# Patient Record
Sex: Male | Born: 1940 | Race: White | Hispanic: No | Marital: Married | Smoking: Never smoker
Health system: Southern US, Community
[De-identification: ages and names within clinical notes are randomized; demographics above are authoritative.]

## PROBLEM LIST (undated history)

## (undated) DIAGNOSIS — K219 Gastro-esophageal reflux disease without esophagitis: Secondary | ICD-10-CM

## (undated) DIAGNOSIS — I499 Cardiac arrhythmia, unspecified: Secondary | ICD-10-CM

## (undated) DIAGNOSIS — I2510 Atherosclerotic heart disease of native coronary artery without angina pectoris: Secondary | ICD-10-CM

## (undated) DIAGNOSIS — K5792 Diverticulitis of intestine, part unspecified, without perforation or abscess without bleeding: Secondary | ICD-10-CM

## (undated) DIAGNOSIS — M1990 Unspecified osteoarthritis, unspecified site: Secondary | ICD-10-CM

## (undated) DIAGNOSIS — E119 Type 2 diabetes mellitus without complications: Secondary | ICD-10-CM

## (undated) DIAGNOSIS — M436 Torticollis: Secondary | ICD-10-CM

## (undated) DIAGNOSIS — I340 Nonrheumatic mitral (valve) insufficiency: Secondary | ICD-10-CM

## (undated) DIAGNOSIS — I10 Essential (primary) hypertension: Secondary | ICD-10-CM

## (undated) DIAGNOSIS — Q600 Renal agenesis, unilateral: Secondary | ICD-10-CM

## (undated) HISTORY — PX: CHOLECYSTECTOMY: SHX55

## (undated) HISTORY — PX: OTHER SURGICAL HISTORY: SHX169

## (undated) HISTORY — PX: CARDIAC CATHETERIZATION: SHX172

## (undated) HISTORY — PX: BACK SURGERY: SHX140

---

## 2001-05-29 ENCOUNTER — Emergency Department (HOSPITAL_COMMUNITY): Admission: EM | Admit: 2001-05-29 | Discharge: 2001-05-29 | Payer: Self-pay | Admitting: Emergency Medicine

## 2001-05-29 ENCOUNTER — Encounter: Payer: Self-pay | Admitting: Emergency Medicine

## 2001-05-30 ENCOUNTER — Observation Stay (HOSPITAL_COMMUNITY): Admission: EM | Admit: 2001-05-30 | Discharge: 2001-06-01 | Payer: Self-pay | Admitting: Emergency Medicine

## 2001-05-31 ENCOUNTER — Encounter: Payer: Self-pay | Admitting: Cardiology

## 2001-06-18 ENCOUNTER — Emergency Department (HOSPITAL_COMMUNITY): Admission: EM | Admit: 2001-06-18 | Discharge: 2001-06-18 | Payer: Self-pay | Admitting: Emergency Medicine

## 2002-08-15 ENCOUNTER — Encounter: Payer: Self-pay | Admitting: Emergency Medicine

## 2002-08-15 ENCOUNTER — Emergency Department (HOSPITAL_COMMUNITY): Admission: EM | Admit: 2002-08-15 | Discharge: 2002-08-16 | Payer: Self-pay | Admitting: Emergency Medicine

## 2004-11-24 ENCOUNTER — Emergency Department: Payer: Self-pay | Admitting: Emergency Medicine

## 2005-01-09 ENCOUNTER — Ambulatory Visit: Payer: Self-pay | Admitting: General Surgery

## 2005-03-03 ENCOUNTER — Ambulatory Visit: Payer: Self-pay | Admitting: General Surgery

## 2007-03-22 ENCOUNTER — Emergency Department: Payer: Self-pay | Admitting: Emergency Medicine

## 2007-04-26 ENCOUNTER — Ambulatory Visit: Payer: Self-pay | Admitting: Gastroenterology

## 2007-05-11 ENCOUNTER — Ambulatory Visit: Payer: Self-pay | Admitting: Surgery

## 2007-05-11 ENCOUNTER — Other Ambulatory Visit: Payer: Self-pay

## 2007-05-12 ENCOUNTER — Ambulatory Visit: Payer: Self-pay | Admitting: Cardiology

## 2007-05-19 ENCOUNTER — Ambulatory Visit: Payer: Self-pay | Admitting: Surgery

## 2007-11-02 ENCOUNTER — Other Ambulatory Visit: Payer: Self-pay

## 2007-11-02 ENCOUNTER — Emergency Department: Payer: Self-pay | Admitting: Emergency Medicine

## 2008-03-01 ENCOUNTER — Ambulatory Visit: Payer: Self-pay | Admitting: Internal Medicine

## 2008-03-29 ENCOUNTER — Ambulatory Visit: Payer: Self-pay | Admitting: Internal Medicine

## 2008-08-13 ENCOUNTER — Ambulatory Visit: Payer: Self-pay | Admitting: Gastroenterology

## 2009-03-31 ENCOUNTER — Emergency Department: Payer: Self-pay | Admitting: Emergency Medicine

## 2009-04-03 ENCOUNTER — Ambulatory Visit: Payer: Self-pay | Admitting: Internal Medicine

## 2010-03-24 ENCOUNTER — Emergency Department: Payer: Self-pay | Admitting: Emergency Medicine

## 2010-04-28 ENCOUNTER — Emergency Department: Payer: Self-pay | Admitting: Emergency Medicine

## 2010-07-23 ENCOUNTER — Emergency Department: Payer: Self-pay | Admitting: Emergency Medicine

## 2011-03-30 ENCOUNTER — Ambulatory Visit: Payer: Self-pay | Admitting: Internal Medicine

## 2011-10-28 ENCOUNTER — Other Ambulatory Visit: Payer: Self-pay | Admitting: Neurosurgery

## 2011-10-28 DIAGNOSIS — M542 Cervicalgia: Secondary | ICD-10-CM

## 2011-11-06 ENCOUNTER — Ambulatory Visit
Admission: RE | Admit: 2011-11-06 | Discharge: 2011-11-06 | Disposition: A | Discharge status: Home / Self Care | Payer: Medicare Other | Source: Ambulatory Visit | Attending: Neurosurgery | Admitting: Neurosurgery

## 2011-11-06 DIAGNOSIS — M542 Cervicalgia: Secondary | ICD-10-CM

## 2011-11-24 ENCOUNTER — Emergency Department: Payer: Self-pay | Admitting: Emergency Medicine

## 2011-11-25 ENCOUNTER — Emergency Department: Payer: Self-pay

## 2011-11-25 LAB — COMPREHENSIVE METABOLIC PANEL
Albumin: 3.6 g/dL (ref 3.4–5.0)
Albumin: 3.8 g/dL (ref 3.4–5.0)
Alkaline Phosphatase: 80 U/L (ref 50–136)
Anion Gap: 12 (ref 7–16)
Anion Gap: 10 (ref 7–16)
BUN: 25 mg/dL — ABNORMAL HIGH (ref 7–18)
BUN: 18 mg/dL (ref 7–18)
Bilirubin,Total: 0.2 mg/dL (ref 0.2–1.0)
Bilirubin,Total: 0.2 mg/dL (ref 0.2–1.0)
Calcium, Total: 8.5 mg/dL (ref 8.5–10.1)
Calcium, Total: 8.6 mg/dL (ref 8.5–10.1)
Chloride: 110 mmol/L — ABNORMAL HIGH (ref 98–107)
Co2: 22 mmol/L (ref 21–32)
Co2: 22 mmol/L (ref 21–32)
Creatinine: 1.42 mg/dL — ABNORMAL HIGH (ref 0.60–1.30)
Creatinine: 1.14 mg/dL (ref 0.60–1.30)
EGFR (African American): 58 — ABNORMAL LOW
EGFR (African American): >60
EGFR (Non-African Amer.): 50 — ABNORMAL LOW
EGFR (Non-African Amer.): >60
Glucose: 141 mg/dL — ABNORMAL HIGH (ref 65–99)
Glucose: 155 mg/dL — ABNORMAL HIGH (ref 65–99)
Osmolality: 294 (ref 275–301)
Osmolality: 292 (ref 275–301)
Potassium: 3.2 mmol/L — ABNORMAL LOW (ref 3.5–5.1)
Potassium: 4.3 mmol/L (ref 3.5–5.1)
SGOT(AST): 24 U/L (ref 15–37)
SGOT(AST): 25 U/L (ref 15–37)
SGPT (ALT): 38 U/L
SGPT (ALT): 36 U/L
Sodium: 144 mmol/L (ref 136–145)
Sodium: 144 mmol/L (ref 136–145)
Total Protein: 6.7 g/dL (ref 6.4–8.2)
Total Protein: 7.1 g/dL (ref 6.4–8.2)

## 2011-11-25 LAB — CBC
HCT: 39.6 % — ABNORMAL LOW (ref 40.0–52.0)
HGB: 13 g/dL (ref 13.0–18.0)
HGB: 13.3 g/dL (ref 13.0–18.0)
MCH: 30.9 pg (ref 26.0–34.0)
MCH: 30.4 pg (ref 26.0–34.0)
MCHC: 33.9 g/dL (ref 32.0–36.0)
MCV: 91 fL (ref 80–100)
Platelet: 201 10*3/uL (ref 150–440)
RDW: 13.3 % (ref 11.5–14.5)
RDW: 13 % (ref 11.5–14.5)
WBC: 7.3 10*3/uL (ref 3.8–10.6)

## 2011-11-25 LAB — URINALYSIS, COMPLETE
Bacteria: NONE SEEN
Blood: NEGATIVE
Ketone: NEGATIVE
RBC,UR: <1 /HPF (ref 0–5)
Specific Gravity: 1.03 (ref 1.003–1.030)
Squamous Epithelial: <1
WBC UR: <1 /HPF (ref 0–5)

## 2011-11-25 LAB — CK TOTAL AND CKMB (NOT AT ARMC)
CK, Total: 271 U/L — ABNORMAL HIGH (ref 35–232)
CK-MB: 3.4 ng/mL (ref 0.5–3.6)

## 2011-11-25 LAB — TROPONIN I: Troponin-I: <0.02 ng/mL

## 2012-01-20 ENCOUNTER — Ambulatory Visit: Payer: Self-pay | Admitting: Gastroenterology

## 2012-01-22 LAB — PATHOLOGY REPORT

## 2012-08-04 ENCOUNTER — Emergency Department: Payer: Self-pay | Admitting: Emergency Medicine

## 2012-08-04 LAB — COMPREHENSIVE METABOLIC PANEL
Albumin: 4.1 g/dL (ref 3.4–5.0)
Anion Gap: 7 (ref 7–16)
BUN: 17 mg/dL (ref 7–18)
Bilirubin,Total: 0.4 mg/dL (ref 0.2–1.0)
Calcium, Total: 8.9 mg/dL (ref 8.5–10.1)
Co2: 25 mmol/L (ref 21–32)
EGFR (Non-African Amer.): 53 — ABNORMAL LOW
Glucose: 131 mg/dL — ABNORMAL HIGH (ref 65–99)
Potassium: 3.8 mmol/L (ref 3.5–5.1)
SGPT (ALT): 56 U/L (ref 12–78)
Total Protein: 7.7 g/dL (ref 6.4–8.2)

## 2012-08-04 LAB — CBC
HCT: 41.9 % (ref 40.0–52.0)
MCH: 29.1 pg (ref 26.0–34.0)
Platelet: 235 10*3/uL (ref 150–440)
WBC: 8 10*3/uL (ref 3.8–10.6)

## 2012-08-04 LAB — URINALYSIS, COMPLETE
Bacteria: NONE SEEN
Blood: NEGATIVE
Ketone: NEGATIVE
RBC,UR: <1 /HPF (ref 0–5)
Specific Gravity: 1.006 (ref 1.003–1.030)
Squamous Epithelial: NONE SEEN

## 2012-08-04 LAB — PROTIME-INR: Prothrombin Time: 13.2 secs (ref 11.5–14.7)

## 2012-08-04 LAB — TROPONIN I: Troponin-I: <0.02 ng/mL

## 2013-02-13 ENCOUNTER — Emergency Department: Payer: Self-pay | Admitting: Emergency Medicine

## 2013-02-13 LAB — COMPREHENSIVE METABOLIC PANEL
Albumin: 3.7 g/dL (ref 3.4–5.0)
Alkaline Phosphatase: 75 U/L (ref 50–136)
Anion Gap: 7 (ref 7–16)
Bilirubin,Total: 0.3 mg/dL (ref 0.2–1.0)
Co2: 23 mmol/L (ref 21–32)
Creatinine: 1.45 mg/dL — ABNORMAL HIGH (ref 0.60–1.30)
EGFR (African American): 56 — ABNORMAL LOW
Glucose: 127 mg/dL — ABNORMAL HIGH (ref 65–99)
Osmolality: 284 (ref 275–301)
Potassium: 4 mmol/L (ref 3.5–5.1)
SGOT(AST): 30 U/L (ref 15–37)
SGPT (ALT): 51 U/L (ref 12–78)

## 2013-02-13 LAB — CBC
HCT: 40.4 % (ref 40.0–52.0)
HGB: 14.2 g/dL (ref 13.0–18.0)
MCV: 89 fL (ref 80–100)
RBC: 4.56 10*6/uL (ref 4.40–5.90)
RDW: 13.3 % (ref 11.5–14.5)
WBC: 10.2 10*3/uL (ref 3.8–10.6)

## 2013-02-13 LAB — URINALYSIS, COMPLETE
Bilirubin,UR: NEGATIVE
Blood: NEGATIVE
Leukocyte Esterase: NEGATIVE
Nitrite: NEGATIVE
Ph: 5 (ref 4.5–8.0)
RBC,UR: <1 /HPF (ref 0–5)
Squamous Epithelial: NONE SEEN
WBC UR: 1 /HPF (ref 0–5)

## 2013-02-13 LAB — TROPONIN I: Troponin-I: <0.02 ng/mL

## 2013-03-27 ENCOUNTER — Other Ambulatory Visit: Payer: Self-pay | Admitting: Neurosurgery

## 2013-03-27 DIAGNOSIS — M419 Scoliosis, unspecified: Secondary | ICD-10-CM

## 2013-03-28 ENCOUNTER — Ambulatory Visit
Admission: RE | Admit: 2013-03-28 | Discharge: 2013-03-28 | Disposition: A | Discharge status: Home / Self Care | Payer: Medicare Other | Source: Ambulatory Visit | Attending: Neurosurgery | Admitting: Neurosurgery

## 2013-03-28 DIAGNOSIS — M419 Scoliosis, unspecified: Secondary | ICD-10-CM

## 2013-06-29 HISTORY — PX: CARDIAC CATHETERIZATION: SHX172

## 2013-08-12 LAB — BASIC METABOLIC PANEL
Anion Gap: 6 — ABNORMAL LOW (ref 7–16)
BUN: 16 mg/dL (ref 7–18)
CREATININE: 1.16 mg/dL (ref 0.60–1.30)
Calcium, Total: 9.6 mg/dL (ref 8.5–10.1)
Chloride: 110 mmol/L — ABNORMAL HIGH (ref 98–107)
Co2: 26 mmol/L (ref 21–32)
EGFR (Non-African Amer.): >60
GLUCOSE: 112 mg/dL — AB (ref 65–99)
Osmolality: 285 (ref 275–301)
Potassium: 3.8 mmol/L (ref 3.5–5.1)
Sodium: 142 mmol/L (ref 136–145)

## 2013-08-12 LAB — CBC
HCT: 42.4 % (ref 40.0–52.0)
HGB: 14.4 g/dL (ref 13.0–18.0)
MCH: 31.1 pg (ref 26.0–34.0)
MCHC: 33.9 g/dL (ref 32.0–36.0)
MCV: 92 fL (ref 80–100)
Platelet: 216 10*3/uL (ref 150–440)
RBC: 4.63 10*6/uL (ref 4.40–5.90)
RDW: 13.8 % (ref 11.5–14.5)
WBC: 8.5 10*3/uL (ref 3.8–10.6)

## 2013-08-12 LAB — TROPONIN I

## 2013-08-13 ENCOUNTER — Observation Stay: Payer: Self-pay | Admitting: Internal Medicine

## 2013-08-13 LAB — CK: CK, Total: 104 U/L

## 2013-08-13 LAB — TROPONIN I
Troponin-I: <0.02 ng/mL
Troponin-I: <0.02 ng/mL

## 2013-08-13 LAB — CK TOTAL AND CKMB (NOT AT ARMC)
CK, TOTAL: 88 U/L
CK-MB: 1.5 ng/mL (ref 0.5–3.6)

## 2013-08-13 LAB — CK-MB
CK-MB: 1.5 ng/mL (ref 0.5–3.6)
CK-MB: 1.6 ng/mL (ref 0.5–3.6)

## 2013-08-14 LAB — CBC WITH DIFFERENTIAL/PLATELET
BASOS ABS: 0.1 10*3/uL (ref 0.0–0.1)
Basophil %: 1 %
Eosinophil #: 0.4 10*3/uL (ref 0.0–0.7)
Eosinophil %: 4.8 %
HCT: 41.8 % (ref 40.0–52.0)
HGB: 14.2 g/dL (ref 13.0–18.0)
LYMPHS PCT: 26.3 %
Lymphocyte #: 2.4 10*3/uL (ref 1.0–3.6)
MCH: 30.9 pg (ref 26.0–34.0)
MCHC: 33.9 g/dL (ref 32.0–36.0)
MCV: 91 fL (ref 80–100)
MONOS PCT: 9.2 %
Monocyte #: 0.8 x10 3/mm (ref 0.2–1.0)
Neutrophil #: 5.3 10*3/uL (ref 1.4–6.5)
Neutrophil %: 58.7 %
Platelet: 213 10*3/uL (ref 150–440)
RBC: 4.59 10*6/uL (ref 4.40–5.90)
RDW: 13.9 % (ref 11.5–14.5)
WBC: 9.1 10*3/uL (ref 3.8–10.6)

## 2013-08-14 LAB — BASIC METABOLIC PANEL
ANION GAP: 5 — AB (ref 7–16)
BUN: 13 mg/dL (ref 7–18)
CALCIUM: 9.2 mg/dL (ref 8.5–10.1)
CHLORIDE: 108 mmol/L — AB (ref 98–107)
CREATININE: 1.16 mg/dL (ref 0.60–1.30)
Co2: 26 mmol/L (ref 21–32)
EGFR (African American): >60
EGFR (Non-African Amer.): >60
Glucose: 112 mg/dL — ABNORMAL HIGH (ref 65–99)
Osmolality: 278 (ref 275–301)
Potassium: 3.8 mmol/L (ref 3.5–5.1)
SODIUM: 139 mmol/L (ref 136–145)

## 2013-08-14 LAB — MAGNESIUM: Magnesium: 1.8 mg/dL

## 2013-10-27 ENCOUNTER — Emergency Department: Payer: Self-pay | Admitting: Emergency Medicine

## 2013-10-27 LAB — CBC WITH DIFFERENTIAL/PLATELET
Basophil #: 0.1 10*3/uL (ref 0.0–0.1)
Basophil %: 0.9 %
EOS ABS: 0.4 10*3/uL (ref 0.0–0.7)
Eosinophil %: 4.2 %
HCT: 44.3 % (ref 40.0–52.0)
HGB: 14.6 g/dL (ref 13.0–18.0)
LYMPHS ABS: 2.3 10*3/uL (ref 1.0–3.6)
LYMPHS PCT: 27.5 %
MCH: 29.7 pg (ref 26.0–34.0)
MCHC: 32.8 g/dL (ref 32.0–36.0)
MCV: 91 fL (ref 80–100)
MONO ABS: 0.8 x10 3/mm (ref 0.2–1.0)
Monocyte %: 9.4 %
Neutrophil #: 4.9 10*3/uL (ref 1.4–6.5)
Neutrophil %: 58 %
PLATELETS: 255 10*3/uL (ref 150–440)
RBC: 4.89 10*6/uL (ref 4.40–5.90)
RDW: 13.3 % (ref 11.5–14.5)
WBC: 8.5 10*3/uL (ref 3.8–10.6)

## 2013-10-27 LAB — URINALYSIS, COMPLETE
BACTERIA: NONE SEEN
BILIRUBIN, UR: NEGATIVE
BLOOD: NEGATIVE
Glucose,UR: NEGATIVE mg/dL (ref 0–75)
Ketone: NEGATIVE
Leukocyte Esterase: NEGATIVE
NITRITE: NEGATIVE
Ph: 6 (ref 4.5–8.0)
Protein: NEGATIVE
SQUAMOUS EPITHELIAL: NONE SEEN
Specific Gravity: 1.008 (ref 1.003–1.030)
WBC UR: <1 /HPF (ref 0–5)

## 2013-10-27 LAB — BASIC METABOLIC PANEL
ANION GAP: 6 — AB (ref 7–16)
BUN: 14 mg/dL (ref 7–18)
CO2: 26 mmol/L (ref 21–32)
Calcium, Total: 9.3 mg/dL (ref 8.5–10.1)
Chloride: 109 mmol/L — ABNORMAL HIGH (ref 98–107)
Creatinine: 1.06 mg/dL (ref 0.60–1.30)
EGFR (African American): >60
EGFR (Non-African Amer.): >60
Glucose: 137 mg/dL — ABNORMAL HIGH (ref 65–99)
OSMOLALITY: 284 (ref 275–301)
Potassium: 3.7 mmol/L (ref 3.5–5.1)
Sodium: 141 mmol/L (ref 136–145)

## 2013-10-31 ENCOUNTER — Emergency Department: Payer: Self-pay | Admitting: Emergency Medicine

## 2013-10-31 LAB — BASIC METABOLIC PANEL
Anion Gap: 6 — ABNORMAL LOW (ref 7–16)
BUN: 15 mg/dL (ref 7–18)
CHLORIDE: 108 mmol/L — AB (ref 98–107)
CO2: 28 mmol/L (ref 21–32)
CREATININE: 0.99 mg/dL (ref 0.60–1.30)
Calcium, Total: 9.7 mg/dL (ref 8.5–10.1)
EGFR (African American): >60
EGFR (Non-African Amer.): >60
Glucose: 94 mg/dL (ref 65–99)
Osmolality: 284 (ref 275–301)
Potassium: 4.1 mmol/L (ref 3.5–5.1)
SODIUM: 142 mmol/L (ref 136–145)

## 2013-10-31 LAB — CBC
HCT: 43.5 % (ref 40.0–52.0)
HGB: 14.7 g/dL (ref 13.0–18.0)
MCH: 30.5 pg (ref 26.0–34.0)
MCHC: 33.8 g/dL (ref 32.0–36.0)
MCV: 90 fL (ref 80–100)
PLATELETS: 256 10*3/uL (ref 150–440)
RBC: 4.81 10*6/uL (ref 4.40–5.90)
RDW: 13.2 % (ref 11.5–14.5)
WBC: 7.9 10*3/uL (ref 3.8–10.6)

## 2013-10-31 LAB — TROPONIN I: Troponin-I: <0.02 ng/mL

## 2013-12-05 ENCOUNTER — Encounter: Payer: Self-pay | Admitting: Internal Medicine

## 2013-12-27 ENCOUNTER — Encounter: Payer: Self-pay | Admitting: Internal Medicine

## 2014-01-27 ENCOUNTER — Encounter: Payer: Self-pay | Admitting: Internal Medicine

## 2014-10-20 NOTE — H&P (Signed)
PATIENT NAME:  Edwin Olson, Edwin Olson MR#:  [id number] DATE OF BIRTH:  1940/12/17  DATE OF ADMISSION:  08/12/2013  REFERRING PHYSICIAN: Dr. Jacqualine Code.    PRIMARY CARE PHYSICIAN: Dr. Fulton Reek.   PRIMARY CARDIOLOGIST: Dr. Nehemiah Massed.   CHIEF COMPLAINT: This is a 74 year old male with significant past medical history of diabetes, hyperlipidemia, hypertension, who presents with complaints of chest pain, reports it has been intermittent over the last two weeks, left side pressure/tightness quality, radiating to the neck and the left arm. No provoking factor, but relieved by when he received nitro paste in the ED. The patient reports the last time he had a stress test was before two years as a part of physical and he does not recall any abnormalities in it. The patient, he took, four baby aspirin at home prior to presenting to ED. The patient's first troponin is negative. His EKG did not show any significant ST or T wave abnormalities. Denies any vomiting, diaphoresis, palpitations, shortness of breath or dizziness. Hospitalist service requested to admit the patient for further work-up.   PAST MEDICAL HISTORY: 1.  Diabetes mellitus.  2.  Hyperlipidemia.  3.  Hypertension.  4.  Benign neoplasm of large bowel. 5.  Idiopathic peripheral neuropathy. 6.  Chronic back pain.  7.  Diverticulosis.   PAST SURGICAL HISTORY: 1.  Lumbar surgery.  2.  Cholecystectomy.   SOCIAL HISTORY: The patient is married. No alcohol. No illicit drug use. No smoking.  FAMILY HISTORY:  Significant for coronary artery disease in both of his parents.   ALLERGIES:  1.  ANTIVERT.  2.  ATENOLOL.  3.  DOXYCYCLINE.   [place].   5.  GLUCOTROL.  6.  PENICILLIN.   HOME MEDICATIONS: 1.  Aspirin 81 mg daily.  2.  Toprol-XL 25 mg oral daily.  3.  Vicodin as needed.  4.  Lisinopril 20 mg daily.  5.  Losartan 50 mg oral daily. 6.  Terazosin 5 mg oral at bedtime.  7.  Gabapentin 600 mg oral 2 times a day.  8.  Lantus 55  units daily.  9.  Metformin 500 mg oral 2 times a day.  10.  Lovastatin 40 mg oral daily. 11.  Zolpidem 5 mg at bedtime.  12.  Methocarbamol 500 mg oral at bedtime.   REVIEW OF SYSTEMS:  CONSTITUTIONAL: Denies fever, chills, fatigue, weakness, weight gain, weight loss.  EYES: Denies blurry vision, double vision, inflammation, glaucoma.  ENT: Denies tinnitus, ear pain, hearing loss, epistaxis or discharge.  RESPIRATORY: Denies cough, wheezing, hemoptysis, dyspnea.  CARDIOVASCULAR: Reports chest pain; currently resolved. Denies edema, arrhythmia, palpitations, syncope.  GASTROINTESTINAL: Reports nausea. Denies vomiting, diarrhea, abdominal pain, hematemesis, constipation.  GENITOURINARY: Denies dysuria, hematuria, renal colic.  ENDOCRINE: Denies polyuria, polydipsia, heat or cold intolerance.  HEMATOLOGY: Denies anemia, easy bruising, bleeding diathesis.  INTEGUMENTARY: Denies acne, rash or skin lesion.  MUSCULOSKELETAL: Denies any gout, arthritis or cramps.  NEUROLOGIC: No denies CVA, transient ischemic attack, headache, ataxia, tremor.  PSYCHIATRIC: Denies anxiety, insomnia or bipolar disorder.   PHYSICAL EXAMINATION: VITAL SIGNS: Pulse 73, respiratory rate 18, blood pressure 181/98, saturating 95% on room.  GENERAL: Well-nourished male who looks comfortable in bed, in no apparent distress.  HEENT: Head atraumatic, normocephalic. Pupils equal, reactive to light. Pink conjunctivae. Anicteric sclerae. Moist oral mucosa.  NECK: Supple. No thyromegaly. No JVD.  CHEST: Good air entry bilaterally. No wheezing, rales, rhonchi.  CARDIOVASCULAR: S1, S2 heard. No rubs, murmur or gallops. No carotid bruits.   ABDOMEN:  Soft, nontender, nondistended. Bowel sounds present.  EXTREMITIES: No edema. No clubbing. No cyanosis. Pedal pulses +2 bilaterally.  PSYCHIATRIC: Appropriate affect. Awake, alert x 3. Intact judgment and insight.  MUSCULOSKELETAL: No joint effusion or erythema.   DATABASE: Glucose  112, BUN 16, creatinine 1.16, sodium 142, potassium 3.8, chloride 110, CO2 26. Troponin less than 0.02. White blood cell 8.5, hemoglobin 14.4, hematocrit 42.4, platelets 216.   EKG showing normal sinus rhythm at 73 beats per minute with left anterior fascicular block without significant ST or T wave abnormalities.   ASSESSMENT AND PLAN: 1.  Chest pain. The patient having risk factors, as well, given the fact his chest pain resolved with nitroglycerin, he will be admitted to telemetry unit. Will continue to cycle his cardiac enzymes and follow the trend. He already took 324 mg of aspirin at home as instructed by EMS over the phone. Currently, having nitro paste; currently is chest pain-free.  We will consult cardiology, Dr. Nehemiah Massed, who is familiar with the patient, especially as we could not have stress test done on Sunday.  2.  Hypertension, uncontrolled. The patient received nitro paste, I expect it will improve, as well we will continue him on his on his home medications.  3.  Diabetes mellitus. Resume the patient's metformin, with insulin sliding scale, we will resume his Lantus at a lower dose. We will decrease from 55 to 40 units subcutaneous daily.  4.  Idiopathic neuropathy. Continue with gabapentin.  5. 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 Continue with p.r.n. Vicodin and methocarbamol.  6.  Hyperlipidemia. Continue with statin.  7.  Deep vein thrombosis prophylaxis. Subcutaneous heparin.  8.  CODE STATUS: The patient reports he has a Living Will;  his wife is his healthcare power of attorney.  He is a full code.   Time spent on admission and patient care:  45 minutes.    ____________________________ Albertine Patricia, MD dse:NTS D: 08/13/2013 01:43:48 ET T: 08/13/2013 01:59:43 ET JOB#: [id number]  cc: Albertine Patricia, MD, <Dictator> Heitor Steinhoff Graciela Husbands MD ELECTRONICALLY SIGNED 08/15/2013 3:08

## 2014-10-20 NOTE — Consult Note (Signed)
PATIENT NAME:  Edwin Olson, Edwin Olson MR#:  [id number] DATE OF BIRTH:  31-Aug-1940  DATE OF CONSULTATION:  08/13/2013  REFERRING PHYSICIAN:  Dr. Margaretmary Eddy. CONSULTING PHYSICIAN:  Corey Skains, MD  REASON FOR CONSULTATION: Diabetes, hypertension, hyperlipidemia and unstable angina.   CHIEF COMPLAINT: "I have chest pain."   HISTORY OF PRESENT ILLNESS: This is a 74 year old male with known cardiovascular risk factors including diabetes, hypertension and hyperlipidemia on appropriate medication management. Recently, he has had shortness of breath  > activities, relieved by rest. In addition to that, he has had some episodes of chest discomfort, substernal in nature, radiating into his left arm which occurred at rest and awakened him this morning. At that time an EKG showed no evidence of acute myocardial infarction and current troponin is normal. The patient is relieved by nitroglycerin and other medication management as well as oxygen and feels comfortable at this time.   REVIEW OF SYSTEMS: The remainder review of systems negative for vision change, ringing in the ears, hearing loss, cough, congestion, heartburn, nausea, vomiting, diarrhea, bloody stools, stomach pain, extremity pain, leg weakness, cramping of the buttocks, known blood clots, headaches, blackouts, dizzy spells, nosebleeds, congestion, trouble swallowing, frequent urination, urination at night, muscle weakness, numbness, anxiety, depression, skin lesions or skin rashes.   PAST MEDICAL HISTORY: 1. Hypertension.  2. Hyperlipidemia.  3. Diabetes.   FAMILY HISTORY: No family members with early onset of cardiovascular disease or hypertension.   SOCIAL HISTORY: Currently denies alcohol or tobacco use.   ALLERGIES: As listed.   MEDICATIONS: As listed.   PHYSICAL EXAMINATION: VITAL SIGNS: Blood pressure 122/68 bilaterally, heart rate 72 upright, reclining, and regular.  GENERAL: He is a well appearing male in no acute distress.  HEENT: No  icterus, thyromegaly, ulcers, hemorrhage, or xanthelasma.  CARDIOVASCULAR: Regular rate and rhythm with normal S1 and S2 without murmur, gallop, or rub. PMI is normal size and placement. Carotid upstroke normal without bruit. Jugular venous pressure is normal.  LUNGS: Clear to auscultation with normal respirations.  ABDOMEN: Soft, nontender, without hepatosplenomegaly or masses. Abdominal aorta is normal size without bruit.  EXTREMITIES: 2+ bilateral pulses in dorsal, pedal, radial and femoral arteries without lower extremity edema, cyanosis, clubbing or ulcers.  NEUROLOGIC: He is oriented to time, place, and person, with normal mood and affect.   ASSESSMENT: This is a 74 year old male with hypertension, hyperlipidemia, diabetes, with acute unstable angina without current evidence of myocardial infarction.   RECOMMENDATIONS: 1. Serial ECG and enzymes to assess for possible myocardial infarction.  2. Echocardiogram for LV systolic dysfunction and cause of significant chest discomfort.  3. Proceed to cardiac catheterization to assess coronary anatomy and further treatment thereof as necessary. 4. [place] class IV and unstable angina.  5. Continue medication management for risk factors listed above.  ____________________________ Corey Skains, MD bjk:sg D: 08/13/2013 08:15:00 ET T: 08/13/2013 08:47:58 ET JOB#: [id number]  cc: Corey Skains, MD, <Dictator> Corey Skains MD ELECTRONICALLY SIGNED 08/13/2013 17:13

## 2014-10-20 NOTE — Discharge Summary (Signed)
PATIENT NAME:  Edwin Olson, Edwin Olson MR#:  [id number] DATE OF BIRTH:  08-20-1940  DATE OF ADMISSION:  08/13/2013 DATE OF DISCHARGE:  08/14/2013  TYPE OF DISCHARGE: The patient is transferred to [facility].   REASON FOR ADMISSION: Chest pain.   HISTORY OF PRESENT ILLNESS: The patient is a 74 year old male with a history of diabetes, hyperlipidemia, and benign hypertension who presented to the Emergency Room with chest pain. In the Emergency Room, the patient was given nitrates with improvement of his symptoms. His EKG did not show any significant ST or T wave changes. He was admitted for further evaluation.   PAST MEDICAL HISTORY: 1.  Type 2 diabetes.  2.  Hyperlipidemia.  3.  Benign hypertension.  4.  Benign neoplasm of the large bowel.  5.  Idiopathic peripheral neuropathy.  6.  Chronic back pain.  7.  Diverticulosis.  8.  Status post lumbar surgery.  9.  Status post cholecystectomy.   MEDICATIONS ON ADMISSION: Please see admission note.   ALLERGIES: ANTIVERT, ATENOLOL, DOXYCYCLINE, GEOCILLIN, GLUCOTROL, AND PENICILLIN.   SOCIAL HISTORY: Negative for alcohol or tobacco abuse.  FAMILY HISTORY: Positive for coronary artery disease.  REVIEW OF SYSTEMS: As per admission note.   PHYSICAL EXAMINATION: GENERAL: The patient was in no acute distress.  VITAL SIGNS: Stable and he was afebrile.  HEENT: Unremarkable.  NECK: Supple without JVD.  LUNGS: Clear.  CARDIAC: Revealed a regular rate and rhythm. Normal S1, S2. No significant murmurs.  ABDOMEN: Soft and nontender with normoactive bowel sounds. No organomegaly or masses appreciated.  EXTREMITIES: Without edema.  NEUROLOGIC: Grossly nonfocal.   HOSPITAL COURSE: The patient was admitted with chest pain. He was admitted to telemetry. He ruled out for a MI by serial enzymes. He was seen by Dr. Nehemiah Massed. Cardiac catheterization was scheduled. The patient underwent cardiac catheterization this morning which revealed significant single vessel  occlusion amenable to PTCA. The procedure could not be done here. [facility] was contacted by Dr. Nehemiah Massed and they have agreed to take the patient in transfer for PTCA at their facility. He is now transferred to [facility] in stable condition.   DISCHARGE DIAGNOSES: 1.  Unstable angina.  2.  Single vessel coronary artery disease.  3.  Type 2 diabetes.  4.  Benign hypertension.  5.  Neuropathy.  6.  Hyperlipidemia.  7.  Chronic pain.  8.  Status post back surgery.   DISCHARGE MEDICATIONS: 1.  Heparin 5000 units subcutaneous q. 8 hours.  2.  Nitrostat 0.4 mg sublingually p.r.n. chest pain.  3.  Norco 5/325 mg 1 p.o. q. 6 hours p.r.n. pain.  4.  Aspirin 81 mg p.o. daily.  5.  Colace 100 mg p.o. b.i.d.  6.  Gabapentin 600 mg p.o. b.i.d.  7.  Losartan 50 mg p.o. daily.  8.  Lovastatin 40 mg p.o. daily.  9.  Metoprolol succinate 25 mg p.o. daily.  10.  Protonix 40 mg p.o. daily. 11.  Hytrin 5 mg p.o. at bedtime.  12.  Ambien 5 mg p.o. at bedtime.   FOLLOW-UP PLANS AND APPOINTMENTS: The patient will be transferred to [facility] at this time for angioplasty and stent placement. He will follow up with Dr. Nehemiah Massed and myself after he is discharged from [facility] He is to call if problems arise before then.   ____________________________ Leonie Douglas. Doy Hutching, MD jds:sb D: 08/14/2013 15:06:27 ET T: 08/14/2013 15:29:39 ET JOB#: [id number]  cc: Leonie Douglas. Doy Hutching, MD, <Dictator> Orman Matsumura Lennice Sites MD ELECTRONICALLY SIGNED 08/15/2013 10:59

## 2014-12-21 ENCOUNTER — Other Ambulatory Visit: Payer: Self-pay

## 2014-12-21 ENCOUNTER — Emergency Department
Admission: EM | Admit: 2014-12-21 | Discharge: 2014-12-21 | Disposition: A | Discharge status: Home / Self Care | Payer: Medicare Other | Attending: Emergency Medicine | Admitting: Emergency Medicine

## 2014-12-21 ENCOUNTER — Encounter: Payer: Self-pay | Admitting: *Deleted

## 2014-12-21 DIAGNOSIS — Z79899 Other long term (current) drug therapy: Secondary | ICD-10-CM | POA: Insufficient documentation

## 2014-12-21 DIAGNOSIS — Z7982 Long term (current) use of aspirin: Secondary | ICD-10-CM | POA: Diagnosis not present

## 2014-12-21 DIAGNOSIS — Z880 Allergy status to penicillin: Secondary | ICD-10-CM | POA: Insufficient documentation

## 2014-12-21 DIAGNOSIS — R1032 Left lower quadrant pain: Secondary | ICD-10-CM | POA: Diagnosis present

## 2014-12-21 DIAGNOSIS — K5732 Diverticulitis of large intestine without perforation or abscess without bleeding: Secondary | ICD-10-CM

## 2014-12-21 DIAGNOSIS — I10 Essential (primary) hypertension: Secondary | ICD-10-CM | POA: Diagnosis not present

## 2014-12-21 DIAGNOSIS — Z794 Long term (current) use of insulin: Secondary | ICD-10-CM | POA: Insufficient documentation

## 2014-12-21 HISTORY — DX: Type 2 diabetes mellitus without complications: E11.9

## 2014-12-21 HISTORY — DX: Essential (primary) hypertension: I10

## 2014-12-21 HISTORY — DX: Diverticulitis of intestine, part unspecified, without perforation or abscess without bleeding: K57.92

## 2014-12-21 LAB — COMPREHENSIVE METABOLIC PANEL
ALBUMIN: 3.9 g/dL (ref 3.5–5.0)
ALK PHOS: 68 U/L (ref 38–126)
ALT: 29 U/L (ref 17–63)
ANION GAP: 5 (ref 5–15)
AST: 29 U/L (ref 15–41)
BILIRUBIN TOTAL: 0.4 mg/dL (ref 0.3–1.2)
BUN: 19 mg/dL (ref 6–20)
CHLORIDE: 108 mmol/L (ref 101–111)
CO2: 27 mmol/L (ref 22–32)
Calcium: 9.4 mg/dL (ref 8.9–10.3)
Creatinine, Ser: 1.11 mg/dL (ref 0.61–1.24)
GFR calc Af Amer: >60 mL/min (ref >60)
Glucose, Bld: 133 mg/dL — ABNORMAL HIGH (ref 65–99)
POTASSIUM: 3.8 mmol/L (ref 3.5–5.1)
Sodium: 140 mmol/L (ref 135–145)
Total Protein: 6.9 g/dL (ref 6.5–8.1)

## 2014-12-21 LAB — CBC WITH DIFFERENTIAL/PLATELET
BASOS PCT: 1 %
Basophils Absolute: 0.1 10*3/uL (ref 0–0.1)
EOS ABS: 0.3 10*3/uL (ref 0–0.7)
Eosinophils Relative: 5 %
HCT: 40.8 % (ref 40.0–52.0)
HEMOGLOBIN: 13.9 g/dL (ref 13.0–18.0)
Lymphocytes Relative: 31 %
Lymphs Abs: 2.2 10*3/uL (ref 1.0–3.6)
MCH: 30.7 pg (ref 26.0–34.0)
MCHC: 34.1 g/dL (ref 32.0–36.0)
MCV: 90 fL (ref 80.0–100.0)
Monocytes Absolute: 0.7 10*3/uL (ref 0.2–1.0)
Monocytes Relative: 10 %
NEUTROS PCT: 53 %
Neutro Abs: 3.8 10*3/uL (ref 1.4–6.5)
Platelets: 235 10*3/uL (ref 150–440)
RBC: 4.53 MIL/uL (ref 4.40–5.90)
RDW: 13.5 % (ref 11.5–14.5)
WBC: 7.1 10*3/uL (ref 3.8–10.6)

## 2014-12-21 LAB — URINALYSIS COMPLETE WITH MICROSCOPIC (ARMC ONLY)
Bacteria, UA: NONE SEEN
Bilirubin Urine: NEGATIVE
Glucose, UA: NEGATIVE mg/dL
HGB URINE DIPSTICK: NEGATIVE
Ketones, ur: NEGATIVE mg/dL
Leukocytes, UA: NEGATIVE
NITRITE: NEGATIVE
PH: 5 (ref 5.0–8.0)
PROTEIN: NEGATIVE mg/dL
SPECIFIC GRAVITY, URINE: 1.02 (ref 1.005–1.030)
Squamous Epithelial / LPF: NONE SEEN

## 2014-12-21 LAB — LIPASE, BLOOD: LIPASE: 36 U/L (ref 22–51)

## 2014-12-21 LAB — TROPONIN I: Troponin I: <0.03 ng/mL (ref <0.031)

## 2014-12-21 MED ORDER — MORPHINE SULFATE 4 MG/ML IJ SOLN
INTRAMUSCULAR | Status: AC
  Filled 2014-12-21: qty 1

## 2014-12-21 MED ORDER — CIPROFLOXACIN HCL 500 MG PO TABS
500.0000 mg | ORAL_TABLET | Freq: Two times a day (BID) | ORAL | Status: DC
Start: 2014-12-21 — End: 2020-02-24

## 2014-12-21 MED ORDER — METRONIDAZOLE 500 MG PO TABS: 500.0000 mg | ORAL_TABLET | Freq: Two times a day (BID) | ORAL | Status: DC

## 2014-12-21 MED ORDER — MORPHINE SULFATE 4 MG/ML IJ SOLN
4.0000 mg | Freq: Once | INTRAMUSCULAR | Status: AC
  Administered 2014-12-21: 4 mg via INTRAVENOUS

## 2014-12-21 MED ORDER — METRONIDAZOLE 500 MG PO TABS
500.0000 mg | ORAL_TABLET | Freq: Once | ORAL | Status: AC
  Administered 2014-12-21: 500 mg via ORAL

## 2014-12-21 MED ORDER — CIPROFLOXACIN HCL 500 MG PO TABS
ORAL_TABLET | ORAL | Status: AC
  Administered 2014-12-21: 500 mg via ORAL
  Filled 2014-12-21: qty 1

## 2014-12-21 MED ORDER — ONDANSETRON HCL 4 MG PO TABS: 4.0000 mg | ORAL_TABLET | Freq: Three times a day (TID) | ORAL | Status: DC | PRN

## 2014-12-21 MED ORDER — SODIUM CHLORIDE 0.9 % IV BOLUS (SEPSIS)
1000.0000 mL | Freq: Once | INTRAVENOUS | Status: AC
  Administered 2014-12-21: 1000 mL via INTRAVENOUS

## 2014-12-21 MED ORDER — METRONIDAZOLE 500 MG PO TABS
ORAL_TABLET | ORAL | Status: AC
Start: 2014-12-21 — End: 2014-12-21
  Administered 2014-12-21: 500 mg via ORAL
  Filled 2014-12-21: qty 1

## 2014-12-21 MED ORDER — ONDANSETRON HCL 4 MG/2ML IJ SOLN
4.0000 mg | Freq: Once | INTRAMUSCULAR | Status: AC
  Administered 2014-12-21: 4 mg via INTRAVENOUS

## 2014-12-21 MED ORDER — CIPROFLOXACIN HCL 500 MG PO TABS
500.0000 mg | ORAL_TABLET | Freq: Once | ORAL | Status: AC
  Administered 2014-12-21: 500 mg via ORAL

## 2014-12-21 MED ORDER — ONDANSETRON HCL 4 MG/2ML IJ SOLN
INTRAMUSCULAR | Status: AC
  Filled 2014-12-21: qty 2

## 2014-12-21 MED ORDER — TRAMADOL HCL 50 MG PO TABS: 50.0000 mg | ORAL_TABLET | Freq: Four times a day (QID) | ORAL | Status: DC | PRN

## 2014-12-21 NOTE — ED Notes (Signed)
Pt states he has LLQ pain for the past 2 weeks and tonight he could not get comfortable. Pt has a h/o diverticulosis in the LLQ. Pt ate strawberries a few weeks ago and states this all started after eating them. Also states bowel movements have not been normal but cannot give specifics.

## 2014-12-21 NOTE — ED Provider Notes (Signed)
[facility] Emergency Department Provider Note  ____________________________________________  Time seen: Approximately 5:12 AM  I have reviewed the triage vital signs and the nursing notes.   HISTORY  Chief Complaint Abdominal Pain    HPI Edwin Olson is a 74 y.o. male who presents with a two-week history of left lower quadrant abdominal pain. Patient presents tonight because he could not get comfortable to get some rest. Patient has a history of diverticulitis without surgical intervention. States he ate strawberries a couple weeks ago and his pain began after eating the berries. Complains of 7/10 nonradiating aching pain in his LLQ. Denies fever, chills, chest pain, shortness of breath, vomiting, diarrhea, dysuria, testicular tenderness, headache, weakness, numbness, tingling. Denies recent travel, surgery, antibiotic use.   Past Medical History  Diagnosis Date  . Diverticulitis   . Diabetes mellitus without complication   . Hypertension     There are no active problems to display for this patient.   Past Surgical History  Procedure Laterality Date  . Cardiac stents       cardiac stents   . Back surgery    . Cholecystectomy      Current Outpatient Rx  Name  Route  Sig  Dispense  Refill  . aspirin EC 81 MG tablet   Oral   Take 81 mg by mouth daily.         Marland Kitchen gabapentin (NEURONTIN) 600 MG tablet   Oral   Take 600 mg by mouth 2 (two) times daily.          Marland Kitchen HYDROcodone-acetaminophen (NORCO/VICODIN) 5-325 MG per tablet   Oral   Take 1 tablet by mouth every 6 (six) hours as needed for moderate pain.         Marland Kitchen insulin glargine (LANTUS) 100 UNIT/ML injection   Subcutaneous   Inject 55 Units into the skin at bedtime.          Marland Kitchen losartan (COZAAR) 50 MG tablet   Oral   Take 50 mg by mouth daily.         Marland Kitchen lovastatin (MEVACOR) 40 MG tablet   Oral   Take 40 mg by mouth at bedtime.         . metFORMIN (GLUCOPHAGE) 500 MG  tablet   Oral   Take by mouth 2 (two) times daily with a meal.         . metoprolol (LOPRESSOR) 50 MG tablet   Oral   Take 50 mg by mouth daily.          . nitroGLYCERIN (NITROSTAT) 0.4 MG SL tablet   Sublingual   Place 0.4 mg under the tongue every 5 (five) minutes as needed for chest pain.         Marland Kitchen Omeprazole-Sodium Bicarbonate (ZEGERID) 20-1100 MG CAPS capsule   Oral   Take 1 capsule by mouth daily before breakfast.         . zolpidem (AMBIEN) 5 MG tablet   Oral   Take 5 mg by mouth at bedtime as needed for sleep.           Allergies Penicillins and Plavix  History reviewed. No pertinent family history.  Social History History  Substance Use Topics  . Smoking status: Never Smoker   . Smokeless tobacco: Not on file  . Alcohol Use: No    Review of Systems Constitutional: No fever/chills Eyes: No visual changes. ENT: No sore throat. Cardiovascular: Denies chest pain. Respiratory: Denies shortness of breath.  Gastrointestinal: Positive for abdominal pain.  No nausea, no vomiting.  No diarrhea.  No constipation. Genitourinary: Negative for dysuria. Musculoskeletal: Negative for back pain. Skin: Negative for rash. Neurological: Negative for headaches, focal weakness or numbness.  10-point ROS otherwise negative.  ____________________________________________   PHYSICAL EXAM:  VITAL SIGNS: ED Triage Vitals  Enc Vitals Group     BP 12/21/14 0329 164/91 mmHg     Pulse Rate 12/21/14 0329 73     Resp 12/21/14 0329 16     Temp 12/21/14 0329 98 F (36.7 C)     Temp Source 12/21/14 0329 Oral     SpO2 12/21/14 0329 95 %     Weight 12/21/14 0329 190 lb (86.183 kg)     Height 12/21/14 0329 5\' 10"  (1.778 m)     Head Cir --      Peak Flow --      Pain Score 12/21/14 0330 7     Pain Loc --      Pain Edu? --      Excl. in [place]? --     Constitutional: Alert and oriented. Well appearing and in no acute distress. Eyes: Conjunctivae are normal. PERRL.  EOMI. Head: Atraumatic. Nose: No congestion/rhinnorhea. Mouth/Throat: Mucous membranes are moist.  Oropharynx non-erythematous. Neck: No stridor.   Cardiovascular: Normal rate, regular rhythm. Grossly normal heart sounds.  Good peripheral circulation. Respiratory: Normal respiratory effort.  No retractions. Lungs CTAB. Gastrointestinal: Soft, mildly tender to left lower quadrant without rebound or guarding. No distention. No abdominal bruits. No CVA tenderness. Musculoskeletal: No lower extremity tenderness nor edema.  No joint effusions. Neurologic:  Normal speech and language. No gross focal neurologic deficits are appreciated. Speech is normal. No gait instability. Skin:  Skin is warm, dry and intact. No rash noted. Psychiatric: Mood and affect are normal. Speech and behavior are normal.  ____________________________________________   LABS (all labs ordered are listed, but only abnormal results are displayed)  Labs Reviewed  COMPREHENSIVE METABOLIC PANEL - Abnormal; Notable for the following:    Glucose, Bld 133 (*)    All other components within normal limits  URINALYSIS COMPLETEWITH MICROSCOPIC ([facility] ONLY) - Abnormal; Notable for the following:    Color, Urine YELLOW (*)    APPearance CLEAR (*)    All other components within normal limits  CBC WITH DIFFERENTIAL/PLATELET  LIPASE, BLOOD  TROPONIN I   ____________________________________________  EKG  ED ECG REPORT I, SUNG,JADE J, the attending physician, personally viewed and interpreted this ECG.   Date: 12/21/2014  EKG Time: 0405  Rate: 66  Rhythm: normal EKG, normal sinus rhythm  Axis: LAD  Intervals:left anterior fascicular block  ST&T Change: Nonspecific  ____________________________________________  RADIOLOGY  None ____________________________________________   PROCEDURES  Procedure(s) performed: None  Critical Care performed: No  ____________________________________________   INITIAL IMPRESSION  / ASSESSMENT AND PLAN / ED COURSE  Pertinent labs & imaging results that were available during my care of the patient were reviewed by me and considered in my medical decision making (see chart for details).  74 year old male with a history of diverticulitis who presents with a two-week history of left lower quadrant pain without fever, vomiting or diarrhea. Plan for IV analgesia, check basic lab work, consider CT abdomen/pelvis for further workup if lab work abnormal.  ----------------------------------------- 5:57 AM on 12/21/2014 -----------------------------------------  Patient is feeling much better. Resting in bed, smiling. Updated patient and spouse of lab results. Reexamined abdomen which is improved from prior. Discussed with them risks/benefits of obtaining  CT scan; given that patient is afebrile with normal white count and improved exam, will hold off on CT. Plan to start empiric antibiotics for diverticulitis, analgesia, antiemetic and close follow-up with PCP. Strict return precautions given. Both verbalize understanding and agree with plan of care. ____________________________________________   FINAL CLINICAL IMPRESSION(S) / ED DIAGNOSES  Final diagnoses:  Left lower quadrant pain  Diverticulitis of large intestine without perforation or abscess without bleeding      Paulette Blanch, MD 12/21/14 [phone]

## 2014-12-21 NOTE — Discharge Instructions (Signed)
1. Take antibiotics as prescribed (Cipro/Flagyl 500mg  twice daily x 7 days). 2. Take pain & nausea medicines as needed (Tramadol/Zofran #20). 3. Return to the ER for worsening symptoms, fever, persistent vomiting or other concerns.  Abdominal Pain Many things can cause abdominal pain. Usually, abdominal pain is not caused by a disease and will improve without treatment. It can often be observed and treated at home. Your health care provider will do a physical exam and possibly order blood tests and X-rays to help determine the seriousness of your pain. However, in many cases, more time must pass before a clear cause of the pain can be found. Before that point, your health care provider may not know if you need more testing or further treatment. HOME CARE INSTRUCTIONS  Monitor your abdominal pain for any changes. The following actions may help to alleviate any discomfort you are experiencing:  Only take over-the-counter or prescription medicines as directed by your health care provider.  Do not take laxatives unless directed to do so by your health care provider.  Try a clear liquid diet (broth, tea, or water) as directed by your health care provider. Slowly move to a bland diet as tolerated. SEEK MEDICAL CARE IF:  You have unexplained abdominal pain.  You have abdominal pain associated with nausea or diarrhea.  You have pain when you urinate or have a bowel movement.  You experience abdominal pain that wakes you in the night.  You have abdominal pain that is worsened or improved by eating food.  You have abdominal pain that is worsened with eating fatty foods.  You have a fever. SEEK IMMEDIATE MEDICAL CARE IF:   Your pain does not go away within 2 hours.  You keep throwing up (vomiting).  Your pain is felt only in portions of the abdomen, such as the right side or the left lower portion of the abdomen.  You pass bloody or black tarry stools. MAKE SURE YOU:  Understand these  instructions.   Will watch your condition.   Will get help right away if you are not doing well or get worse.  Document Released: 03/25/2005 Document Revised: 06/20/2013 Document Reviewed: 02/22/2013 [facility] Patient Information 2015 [place], [place]. This information is not intended to replace advice given to you by your health care provider. Make sure you discuss any questions you have with your health care provider.  Diverticulitis Diverticulitis is inflammation or infection of small pouches in your colon that form when you have a condition called diverticulosis. The pouches in your colon are called diverticula. Your colon, or large intestine, is where water is absorbed and stool is formed. Complications of diverticulitis can include:  Bleeding.  Severe infection.  Severe pain.  Perforation of your colon.  Obstruction of your colon. CAUSES  Diverticulitis is caused by bacteria. Diverticulitis happens when stool becomes trapped in diverticula. This allows bacteria to grow in the diverticula, which can lead to inflammation and infection. RISK FACTORS People with diverticulosis are at risk for diverticulitis. Eating a diet that does not include enough fiber from fruits and vegetables may make diverticulitis more likely to develop. SYMPTOMS  Symptoms of diverticulitis may include:  Abdominal pain and tenderness. The pain is normally located on the left side of the abdomen, but may occur in other areas.  Fever and chills.  Bloating.  Cramping.  Nausea.  Vomiting.  Constipation.  Diarrhea.  Blood in your stool. DIAGNOSIS  Your health care provider will ask you about your medical history and do  a physical exam. You may need to have tests done because many medical conditions can cause the same symptoms as diverticulitis. Tests may include:  Blood tests.  Urine tests.  Imaging tests of the abdomen, including X-rays and CT scans. When your condition is under control,  your health care provider may recommend that you have a colonoscopy. A colonoscopy can show how severe your diverticula are and whether something else is causing your symptoms. TREATMENT  Most cases of diverticulitis are mild and can be treated at home. Treatment may include:  Taking over-the-counter pain medicines.  Following a clear liquid diet.  Taking antibiotic medicines by mouth for 7-10 days. More severe cases may be treated at a hospital. Treatment may include:  Not eating or drinking.  Taking prescription pain medicine.  Receiving antibiotic medicines through an IV tube.  Receiving fluids and nutrition through an IV tube.  Surgery. HOME CARE INSTRUCTIONS   Follow your health care provider's instructions carefully.  Follow a full liquid diet or other diet as directed by your health care provider. After your symptoms improve, your health care provider may tell you to change your diet. He or she may recommend you eat a high-fiber diet. Fruits and vegetables are good sources of fiber. Fiber makes it easier to pass stool.  Take fiber supplements or probiotics as directed by your health care provider.  Only take medicines as directed by your health care provider.  Keep all your follow-up appointments. SEEK MEDICAL CARE IF:   Your pain does not improve.  You have a hard time eating food.  Your bowel movements do not return to normal. SEEK IMMEDIATE MEDICAL CARE IF:   Your pain becomes worse.  Your symptoms do not get better.  Your symptoms suddenly get worse.  You have a fever.  You have repeated vomiting.  You have bloody or black, tarry stools. MAKE SURE YOU:   Understand these instructions.  Will watch your condition.  Will get help right away if you are not doing well or get worse. Document Released: 03/25/2005 Document Revised: 06/20/2013 Document Reviewed: 05/10/2013 [facility] Patient Information 2015 [place], [place]. This information is not intended  to replace advice given to you by your health care provider. Make sure you discuss any questions you have with your health care provider.

## 2014-12-25 ENCOUNTER — Other Ambulatory Visit: Payer: Self-pay

## 2014-12-25 ENCOUNTER — Emergency Department
Admission: EM | Admit: 2014-12-25 | Discharge: 2014-12-26 | Disposition: A | Discharge status: Home / Self Care | Payer: Medicare Other | Attending: Emergency Medicine | Admitting: Emergency Medicine

## 2014-12-25 ENCOUNTER — Encounter: Payer: Self-pay | Admitting: Emergency Medicine

## 2014-12-25 DIAGNOSIS — E119 Type 2 diabetes mellitus without complications: Secondary | ICD-10-CM | POA: Diagnosis not present

## 2014-12-25 DIAGNOSIS — Z880 Allergy status to penicillin: Secondary | ICD-10-CM | POA: Diagnosis not present

## 2014-12-25 DIAGNOSIS — I10 Essential (primary) hypertension: Secondary | ICD-10-CM | POA: Insufficient documentation

## 2014-12-25 DIAGNOSIS — K5731 Diverticulosis of large intestine without perforation or abscess with bleeding: Secondary | ICD-10-CM

## 2014-12-25 DIAGNOSIS — Z792 Long term (current) use of antibiotics: Secondary | ICD-10-CM | POA: Diagnosis not present

## 2014-12-25 DIAGNOSIS — Z794 Long term (current) use of insulin: Secondary | ICD-10-CM | POA: Diagnosis not present

## 2014-12-25 DIAGNOSIS — R05 Cough: Secondary | ICD-10-CM | POA: Diagnosis not present

## 2014-12-25 DIAGNOSIS — Z79899 Other long term (current) drug therapy: Secondary | ICD-10-CM | POA: Diagnosis not present

## 2014-12-25 DIAGNOSIS — R1032 Left lower quadrant pain: Secondary | ICD-10-CM | POA: Diagnosis present

## 2014-12-25 DIAGNOSIS — Z7982 Long term (current) use of aspirin: Secondary | ICD-10-CM | POA: Diagnosis not present

## 2014-12-25 NOTE — ED Notes (Signed)
Pt presents to ER alert and in NAD via EMS. Pt had recent diagnosis of diverticulitis. Pt states generalized abd pain, reports he stopped taking antibiotics because they made him itch.

## 2014-12-26 ENCOUNTER — Emergency Department: Payer: Medicare Other

## 2014-12-26 DIAGNOSIS — K5731 Diverticulosis of large intestine without perforation or abscess with bleeding: Secondary | ICD-10-CM | POA: Diagnosis not present

## 2014-12-26 LAB — CBC WITH DIFFERENTIAL/PLATELET
Basophils Absolute: 0.3 10*3/uL — ABNORMAL HIGH (ref 0–0.1)
Basophils Relative: 4 %
EOS PCT: 5 %
Eosinophils Absolute: 0.4 10*3/uL (ref 0–0.7)
HCT: 43.4 % (ref 40.0–52.0)
Hemoglobin: 14.4 g/dL (ref 13.0–18.0)
LYMPHS ABS: 1.2 10*3/uL (ref 1.0–3.6)
Lymphocytes Relative: 13 %
MCH: 30 pg (ref 26.0–34.0)
MCHC: 33.3 g/dL (ref 32.0–36.0)
MCV: 90.2 fL (ref 80.0–100.0)
MONOS PCT: 7 %
Monocytes Absolute: 0.7 10*3/uL (ref 0.2–1.0)
Neutro Abs: 7 10*3/uL — ABNORMAL HIGH (ref 1.4–6.5)
Neutrophils Relative %: 71 %
PLATELETS: 237 10*3/uL (ref 150–440)
RBC: 4.81 MIL/uL (ref 4.40–5.90)
RDW: 13.4 % (ref 11.5–14.5)
WBC: 9.7 10*3/uL (ref 3.8–10.6)

## 2014-12-26 LAB — URINALYSIS COMPLETE WITH MICROSCOPIC (ARMC ONLY)
BACTERIA UA: NONE SEEN
Bilirubin Urine: NEGATIVE
GLUCOSE, UA: NEGATIVE mg/dL
HGB URINE DIPSTICK: NEGATIVE
Ketones, ur: NEGATIVE mg/dL
NITRITE: NEGATIVE
Protein, ur: NEGATIVE mg/dL
SPECIFIC GRAVITY, URINE: 1.02 (ref 1.005–1.030)
SQUAMOUS EPITHELIAL / LPF: NONE SEEN
pH: 7 (ref 5.0–8.0)

## 2014-12-26 LAB — COMPREHENSIVE METABOLIC PANEL
ALK PHOS: 73 U/L (ref 38–126)
ALT: 35 U/L (ref 17–63)
ANION GAP: 9 (ref 5–15)
AST: 36 U/L (ref 15–41)
Albumin: 4.1 g/dL (ref 3.5–5.0)
BUN: 14 mg/dL (ref 6–20)
CO2: 27 mmol/L (ref 22–32)
Calcium: 9.5 mg/dL (ref 8.9–10.3)
Chloride: 105 mmol/L (ref 101–111)
Creatinine, Ser: 1.29 mg/dL — ABNORMAL HIGH (ref 0.61–1.24)
GFR, EST NON AFRICAN AMERICAN: 54 mL/min — AB (ref >60)
GLUCOSE: 165 mg/dL — AB (ref 65–99)
Potassium: 3.9 mmol/L (ref 3.5–5.1)
SODIUM: 141 mmol/L (ref 135–145)
TOTAL PROTEIN: 7.2 g/dL (ref 6.5–8.1)
Total Bilirubin: 0.3 mg/dL (ref 0.3–1.2)

## 2014-12-26 MED ORDER — MORPHINE SULFATE 4 MG/ML IJ SOLN
4.0000 mg | Freq: Once | INTRAMUSCULAR | Status: AC
  Administered 2014-12-26: 4 mg via INTRAVENOUS

## 2014-12-26 MED ORDER — IOHEXOL 240 MG/ML SOLN
25.0000 mL | Freq: Once | INTRAMUSCULAR | Status: AC | PRN
  Administered 2014-12-26: 25 mL via ORAL

## 2014-12-26 MED ORDER — IOHEXOL 300 MG/ML  SOLN
100.0000 mL | Freq: Once | INTRAMUSCULAR | Status: AC | PRN
  Administered 2014-12-26: 80 mL via INTRAVENOUS

## 2014-12-26 MED ORDER — ONDANSETRON HCL 4 MG/2ML IJ SOLN
INTRAMUSCULAR | Status: AC
  Administered 2014-12-26: 4 mg via INTRAVENOUS
  Filled 2014-12-26: qty 2

## 2014-12-26 MED ORDER — HYDROCODONE-ACETAMINOPHEN 5-325 MG PO TABS: 1.0000 | ORAL_TABLET | ORAL | Status: DC | PRN

## 2014-12-26 MED ORDER — HYDROCODONE-ACETAMINOPHEN 5-325 MG PO TABS: 1.0000 | ORAL_TABLET | Freq: Four times a day (QID) | ORAL | Status: DC | PRN

## 2014-12-26 MED ORDER — ONDANSETRON HCL 4 MG/2ML IJ SOLN
4.0000 mg | Freq: Once | INTRAMUSCULAR | Status: AC
  Administered 2014-12-26: 4 mg via INTRAVENOUS

## 2014-12-26 MED ORDER — MORPHINE SULFATE 4 MG/ML IJ SOLN
INTRAMUSCULAR | Status: AC
  Administered 2014-12-26: 4 mg via INTRAVENOUS
  Filled 2014-12-26: qty 1

## 2014-12-26 MED ORDER — OXYCODONE-ACETAMINOPHEN 7.5-325 MG PO TABS: 1.0000 | ORAL_TABLET | ORAL | Status: AC | PRN

## 2014-12-26 MED ORDER — MOXIFLOXACIN HCL 400 MG PO TABS: 400.0000 mg | ORAL_TABLET | Freq: Every day | ORAL | Status: AC

## 2014-12-26 MED ORDER — HYDROCODONE-ACETAMINOPHEN 5-325 MG PO TABS: 1.0000 | ORAL_TABLET | Freq: Once | ORAL | Status: DC

## 2014-12-26 NOTE — ED Notes (Signed)
Pt alert and ambulatory upon d/c to wife.

## 2014-12-26 NOTE — ED Provider Notes (Signed)
[facility] Emergency Department Provider Note  ____________________________________________  Time seen: 11:50 PM  I have reviewed the triage vital signs and the nursing notes.   HISTORY  Chief Complaint Abdominal Pain and Cough      HPI Edwin Olson is a 74 y.o. male presents with 8 out of 10 sharp left lower quadrant abdominal pain. Patient admits to being diagnosed with diverticulitis last week at [facility] ED and prescribed 2 antibiotics. Patient does however admit to being noncompliant with metronidazole secondary to generalized pruritus. Patient denies any fever no nausea or vomiting. She does admit to a history of diverticulitis in the past.     Past Medical History  Diagnosis Date  . Diverticulitis   . Diabetes mellitus without complication   . Hypertension   . Diverticulitis     There are no active problems to display for this patient.   Past Surgical History  Procedure Laterality Date  . Cardiac stents       cardiac stents   . Back surgery    . Cholecystectomy      Current Outpatient Rx  Name  Route  Sig  Dispense  Refill  . aspirin EC 81 MG tablet   Oral   Take 81 mg by mouth daily.         . ciprofloxacin (CIPRO) 500 MG tablet   Oral   Take 1 tablet (500 mg total) by mouth 2 (two) times daily.   14 tablet   0   . gabapentin (NEURONTIN) 600 MG tablet   Oral   Take 600 mg by mouth 2 (two) times daily.          Marland Kitchen HYDROcodone-acetaminophen (NORCO/VICODIN) 5-325 MG per tablet   Oral   Take 1 tablet by mouth every 6 (six) hours as needed for moderate pain.         Marland Kitchen insulin glargine (LANTUS) 100 UNIT/ML injection   Subcutaneous   Inject 55 Units into the skin at bedtime.          Marland Kitchen losartan (COZAAR) 50 MG tablet   Oral   Take 50 mg by mouth daily.         Marland Kitchen lovastatin (MEVACOR) 40 MG tablet   Oral   Take 40 mg by mouth at bedtime.         . metFORMIN (GLUCOPHAGE) 500 MG tablet   Oral   Take by mouth 2  (two) times daily with a meal.         . metoprolol (LOPRESSOR) 50 MG tablet   Oral   Take 50 mg by mouth daily.          . metroNIDAZOLE (FLAGYL) 500 MG tablet   Oral   Take 1 tablet (500 mg total) by mouth 2 (two) times daily.   14 tablet   0   . nitroGLYCERIN (NITROSTAT) 0.4 MG SL tablet   Sublingual   Place 0.4 mg under the tongue every 5 (five) minutes as needed for chest pain.         Marland Kitchen Omeprazole-Sodium Bicarbonate (ZEGERID) 20-1100 MG CAPS capsule   Oral   Take 1 capsule by mouth daily before breakfast.         . ondansetron (ZOFRAN) 4 MG tablet   Oral   Take 1 tablet (4 mg total) by mouth every 8 (eight) hours as needed for nausea or vomiting.   20 tablet   1   . traMADol (ULTRAM) 50 MG tablet  Oral   Take 1 tablet (50 mg total) by mouth every 6 (six) hours as needed.   20 tablet   0   . zolpidem (AMBIEN) 5 MG tablet   Oral   Take 5 mg by mouth at bedtime as needed for sleep.           Allergies Atenolol; Penicillins; and Plavix  History reviewed. No pertinent family history.  Social History History  Substance Use Topics  . Smoking status: Never Smoker   . Smokeless tobacco: Not on file  . Alcohol Use: No    Review of Systems  Constitutional: Negative for fever. Eyes: Negative for visual changes. ENT: Negative for sore throat. Cardiovascular: Negative for chest pain. Respiratory: Negative for shortness of breath. Gastrointestinal:Positive for abdominal pain Genitourinary: Negative for dysuria. Musculoskeletal: Negative for back pain. Skin: Negative for rash. Neurological: Negative for headaches, focal weakness or numbness.    10-point ROS otherwise negative.  ____________________________________________   PHYSICAL EXAM:  VITAL SIGNS: ED Triage Vitals  Enc Vitals Group     BP --      Pulse Rate 12/25/14 2344 72     Resp 12/25/14 2344 18     Temp 12/25/14 2344 98.3 F (36.8 C)     Temp Source 12/25/14 2344 Oral      SpO2 12/25/14 2344 96 %     Weight 12/25/14 2342 190 lb (86.183 kg)     Height 12/25/14 2342 5\' 10"  (1.778 m)     Head Cir --      Peak Flow --      Pain Score 12/25/14 2342 6     Pain Loc --      Pain Edu? --      Excl. in [place]? --      Constitutional: Alert and oriented. Well appearing and in no distress. Eyes: Conjunctivae are normal. PERRL. Normal extraocular movements. ENT   Head: Normocephalic and atraumatic.   Nose: No congestion/rhinnorhea.   Mouth/Throat: Mucous membranes are moist.   Neck: No stridor. Hematological/Lymphatic/Immunilogical: No cervical lymphadenopathy. Cardiovascular: Normal rate, regular rhythm. Normal and symmetric distal pulses are present in all extremities. No murmurs, rubs, or gallops. Respiratory: Normal respiratory effort without tachypnea nor retractions. Breath sounds are clear and equal bilaterally. No wheezes/rales/rhonchi. Gastrointestintestinal: Positive left lower quadrant pain with palpation, no abdominal distention. There is no CVA tenderness. Genitourinary: deferred Musculoskeletal: Nontender with normal range of motion in all extremities. No joint effusions.  No lower extremity tenderness nor edema. Neurologic:  Normal speech and language. No gross focal neurologic deficits are appreciated. Speech is normal.  Skin:  Skin is warm, dry and intact. No rash noted. Psychiatric: Mood and affect are normal. Speech and behavior are normal. Patient exhibits appropriate insight and judgment.  ____________________________________________    LABS (pertinent positives/negatives)  Labs Reviewed  CBC WITH DIFFERENTIAL/PLATELET - Abnormal; Notable for the following:    Neutro Abs 7.0 (*)    Basophils Absolute 0.3 (*)    All other components within normal limits  COMPREHENSIVE METABOLIC PANEL - Abnormal; Notable for the following:    Glucose, Bld 165 (*)    Creatinine, Ser 1.29 (*)    GFR calc non Af Amer 54 (*)    All other components  within normal limits  URINALYSIS COMPLETEWITH MICROSCOPIC ([facility] ONLY) - Abnormal; Notable for the following:    Color, Urine YELLOW (*)    APPearance CLEAR (*)    Leukocytes, UA TRACE (*)    All other components within  normal limits  LIPASE, BLOOD     ____________________________________________    RADIOLOGY IMPRESSION: 1. Extensive diverticulosis. No evidence of diverticulitis. No abscess 2. No acute inflammatory changes are evident in the abdomen or pelvis. 3. Moderately severe lumbar degenerative disc changes, greatest at L2-3. CAT scan of the abdomen and pelvis revealed:  ____________________________________________     INITIAL IMPRESSION / ASSESSMENT AND PLAN / ED COURSE  Pertinent labs & imaging results that were available during my care of the patient were reviewed by me and considered in my medical decision making (see chart for details).    ____________________________________________   FINAL CLINICAL IMPRESSION(S) / ED DIAGNOSES  Final diagnoses:  Diverticulosis large intestine w/o perforation or abscess w/bleeding      Gregor Hams, MD 12/26/14 0246

## 2014-12-26 NOTE — ED Notes (Signed)
RN called lab to add on lipase.

## 2014-12-26 NOTE — ED Notes (Signed)
Pt to CT via stretcher

## 2014-12-26 NOTE — ED Notes (Signed)
MD at bedside. 

## 2014-12-26 NOTE — Discharge Instructions (Signed)
Diverticulosis Diverticulosis is the condition that develops when small pouches (diverticula) form in the wall of your colon. Your colon, or large intestine, is where water is absorbed and stool is formed. The pouches form when the inside layer of your colon pushes through weak spots in the outer layers of your colon. CAUSES  No one knows exactly what causes diverticulosis. RISK FACTORS  Being older than 50. Your risk for this condition increases with age. Diverticulosis is rare in people younger than 40 years. By age 80, almost everyone has it.  Eating a low-fiber diet.  Being frequently constipated.  Being overweight.  Not getting enough exercise.  Smoking.  Taking over-the-counter pain medicines, like aspirin and ibuprofen. SYMPTOMS  Most people with diverticulosis do not have symptoms. DIAGNOSIS  Because diverticulosis often has no symptoms, health care providers often discover the condition during an exam for other colon problems. In many cases, a health care provider will diagnose diverticulosis while using a flexible scope to examine the colon (colonoscopy). TREATMENT  If you have never developed an infection related to diverticulosis, you may not need treatment. If you have had an infection before, treatment may include:  Eating more fruits, vegetables, and grains.  Taking a fiber supplement.  Taking a live bacteria supplement (probiotic).  Taking medicine to relax your colon. HOME CARE INSTRUCTIONS   Drink at least 6-8 glasses of water each day to prevent constipation.  Try not to strain when you have a bowel movement.  Keep all follow-up appointments. If you have had an infection before:  Increase the fiber in your diet as directed by your health care provider or dietitian.  Take a dietary fiber supplement if your health care provider approves.  Only take medicines as directed by your health care provider. SEEK MEDICAL CARE IF:   You have abdominal  pain.  You have bloating.  You have cramps.  You have not gone to the bathroom in 3 days. SEEK IMMEDIATE MEDICAL CARE IF:   Your pain gets worse.  Yourbloating becomes very bad.  You have a fever or chills, and your symptoms suddenly get worse.  You begin vomiting.  You have bowel movements that are bloody or black. MAKE SURE YOU:  Understand these instructions.  Will watch your condition.  Will get help right away if you are not doing well or get worse. Document Released: 03/12/2004 Document Revised: 06/20/2013 Document Reviewed: 05/10/2013 [employer] Patient Information 2015 [employer]. This information is not intended to replace advice given to you by your health care provider. Make sure you discuss any questions you have with your health care provider.  

## 2015-05-09 ENCOUNTER — Encounter: Payer: Self-pay | Admitting: *Deleted

## 2015-05-10 ENCOUNTER — Encounter
Admission: RE | Disposition: A | Discharge status: Home / Self Care | Payer: Self-pay | Source: Ambulatory Visit | Attending: Gastroenterology

## 2015-05-10 ENCOUNTER — Ambulatory Visit: Payer: Medicare Other | Admitting: Anesthesiology

## 2015-05-10 ENCOUNTER — Ambulatory Visit
Admission: RE | Admit: 2015-05-10 | Discharge: 2015-05-10 | Disposition: A | Discharge status: Home / Self Care | Payer: Medicare Other | Source: Ambulatory Visit | Attending: Gastroenterology | Admitting: Gastroenterology

## 2015-05-10 ENCOUNTER — Encounter: Payer: Self-pay | Admitting: *Deleted

## 2015-05-10 DIAGNOSIS — K5730 Diverticulosis of large intestine without perforation or abscess without bleeding: Secondary | ICD-10-CM | POA: Diagnosis not present

## 2015-05-10 DIAGNOSIS — Z79899 Other long term (current) drug therapy: Secondary | ICD-10-CM | POA: Diagnosis not present

## 2015-05-10 DIAGNOSIS — I340 Nonrheumatic mitral (valve) insufficiency: Secondary | ICD-10-CM | POA: Insufficient documentation

## 2015-05-10 DIAGNOSIS — Z794 Long term (current) use of insulin: Secondary | ICD-10-CM | POA: Diagnosis not present

## 2015-05-10 DIAGNOSIS — D122 Benign neoplasm of ascending colon: Secondary | ICD-10-CM | POA: Insufficient documentation

## 2015-05-10 DIAGNOSIS — M1990 Unspecified osteoarthritis, unspecified site: Secondary | ICD-10-CM | POA: Diagnosis not present

## 2015-05-10 DIAGNOSIS — Z86010 Personal history of colonic polyps: Secondary | ICD-10-CM | POA: Diagnosis present

## 2015-05-10 DIAGNOSIS — I2510 Atherosclerotic heart disease of native coronary artery without angina pectoris: Secondary | ICD-10-CM | POA: Diagnosis not present

## 2015-05-10 DIAGNOSIS — K219 Gastro-esophageal reflux disease without esophagitis: Secondary | ICD-10-CM | POA: Insufficient documentation

## 2015-05-10 DIAGNOSIS — Z7982 Long term (current) use of aspirin: Secondary | ICD-10-CM | POA: Insufficient documentation

## 2015-05-10 DIAGNOSIS — I10 Essential (primary) hypertension: Secondary | ICD-10-CM | POA: Diagnosis not present

## 2015-05-10 DIAGNOSIS — K625 Hemorrhage of anus and rectum: Secondary | ICD-10-CM | POA: Insufficient documentation

## 2015-05-10 DIAGNOSIS — E119 Type 2 diabetes mellitus without complications: Secondary | ICD-10-CM | POA: Diagnosis not present

## 2015-05-10 DIAGNOSIS — Z880 Allergy status to penicillin: Secondary | ICD-10-CM | POA: Insufficient documentation

## 2015-05-10 DIAGNOSIS — Z9109 Other allergy status, other than to drugs and biological substances: Secondary | ICD-10-CM | POA: Insufficient documentation

## 2015-05-10 DIAGNOSIS — D125 Benign neoplasm of sigmoid colon: Secondary | ICD-10-CM | POA: Insufficient documentation

## 2015-05-10 DIAGNOSIS — Z955 Presence of coronary angioplasty implant and graft: Secondary | ICD-10-CM | POA: Diagnosis not present

## 2015-05-10 HISTORY — DX: Gastro-esophageal reflux disease without esophagitis: K21.9

## 2015-05-10 HISTORY — DX: Atherosclerotic heart disease of native coronary artery without angina pectoris: I25.10

## 2015-05-10 HISTORY — DX: Cardiac arrhythmia, unspecified: I49.9

## 2015-05-10 HISTORY — DX: Nonrheumatic mitral (valve) insufficiency: I34.0

## 2015-05-10 HISTORY — PX: COLONOSCOPY WITH PROPOFOL: SHX5780

## 2015-05-10 HISTORY — DX: Unspecified osteoarthritis, unspecified site: M19.90

## 2015-05-10 LAB — GLUCOSE, CAPILLARY: Glucose-Capillary: 136 mg/dL — ABNORMAL HIGH (ref 65–99)

## 2015-05-10 SURGERY — COLONOSCOPY WITH PROPOFOL
Anesthesia: General

## 2015-05-10 MED ORDER — FENTANYL CITRATE (PF) 100 MCG/2ML IJ SOLN
INTRAMUSCULAR | Status: DC | PRN
  Administered 2015-05-10: 50 ug via INTRAVENOUS

## 2015-05-10 MED ORDER — PROPOFOL 500 MG/50ML IV EMUL
INTRAVENOUS | Status: DC | PRN
  Administered 2015-05-10: 75 ug/kg/min via INTRAVENOUS

## 2015-05-10 MED ORDER — MIDAZOLAM HCL 2 MG/2ML IJ SOLN
INTRAMUSCULAR | Status: DC | PRN
  Administered 2015-05-10: 1 mg via INTRAVENOUS

## 2015-05-10 MED ORDER — PROPOFOL 10 MG/ML IV BOLUS
INTRAVENOUS | Status: DC | PRN
  Administered 2015-05-10 (×2): 30 mg via INTRAVENOUS

## 2015-05-10 MED ORDER — SODIUM CHLORIDE 0.9 % IV SOLN
INTRAVENOUS | Status: DC
  Administered 2015-05-10: 09:00:00 via INTRAVENOUS

## 2015-05-10 MED ORDER — SODIUM CHLORIDE 0.9 % IV SOLN
INTRAVENOUS | Status: DC
  Administered 2015-05-10: 08:00:00 via INTRAVENOUS

## 2015-05-10 NOTE — Transfer of Care (Signed)
Immediate Anesthesia Transfer of Care Note  Patient: Edwin Olson [facility]  Procedure(s) Performed: Procedure(s): COLONOSCOPY WITH PROPOFOL (N/A)  Patient Location: PACU  Anesthesia Type:General  Level of Consciousness: awake and oriented  Airway & Oxygen Therapy: Patient Spontanous Breathing and Patient connected to nasal cannula oxygen  Post-op Assessment: Report given to RN and Post -op Vital signs reviewed and stable  Post vital signs: Reviewed and stable  Last Vitals:  Filed Vitals:   05/10/15 0803  BP: 156/106  Pulse: 90  Temp: 36.3 C  Resp: 12    Complications: No apparent anesthesia complications

## 2015-05-10 NOTE — H&P (Signed)
Primary Care Physician:  Idelle Crouch, MD Primary Gastroenterologist:  Dr. Candace Cruise  Pre-Procedure History & Physical: HPI:  Edwin Olson is a 74 y.o. male is here for an colonoscopy.  Past Medical History  Diagnosis Date  . Diverticulitis   . Diabetes mellitus without complication ([place])   . Hypertension   . Diverticulitis   . Coronary artery disease   . GERD (gastroesophageal reflux disease)   . Arthritis   . Mitral valve insufficiency   . Dysrhythmia     PVC's    Past Surgical History  Procedure Laterality Date  . Cardiac stents       cardiac stents   . Back surgery    . Cholecystectomy      Prior to Admission medications   Medication Sig Start Date End Date Taking? Authorizing Provider  diazepam (VALIUM) 5 MG tablet Take 5 mg by mouth at bedtime as needed for anxiety.   Yes Historical Provider, MD  gabapentin (NEURONTIN) 600 MG tablet Take 600 mg by mouth 2 (two) times daily.    Yes Historical Provider, MD  insulin glargine (LANTUS) 100 UNIT/ML injection Inject 55 Units into the skin daily. 20 units in AM and 35 units at bedtime   Yes Historical Provider, MD  losartan (COZAAR) 50 MG tablet Take 100 mg by mouth daily.    Yes Historical Provider, MD  lovastatin (MEVACOR) 40 MG tablet Take 40 mg by mouth at bedtime.   Yes Historical Provider, MD  metFORMIN (GLUCOPHAGE) 500 MG tablet Take by mouth 2 (two) times daily with a meal.   Yes Historical Provider, MD  metoprolol (LOPRESSOR) 50 MG tablet Take 50 mg by mouth daily.    Yes Historical Provider, MD  nystatin (MYCOSTATIN/NYSTOP) 100000 UNIT/GM POWD Apply 1 g topically 3 (three) times daily as needed.   Yes Historical Provider, MD  Omeprazole-Sodium Bicarbonate (ZEGERID) 20-1100 MG CAPS capsule Take 1 capsule by mouth daily before breakfast.   Yes Historical Provider, MD  zolpidem (AMBIEN) 5 MG tablet Take 5 mg by mouth at bedtime as needed for sleep.   Yes Historical Provider, MD  aspirin EC 81 MG tablet Take 81 mg by  mouth daily.    Historical Provider, MD  ciprofloxacin (CIPRO) 500 MG tablet Take 1 tablet (500 mg total) by mouth 2 (two) times daily. 12/21/14   Paulette Blanch, MD  HYDROcodone-acetaminophen (NORCO/VICODIN) 5-325 MG per tablet Take 1 tablet by mouth every 4 (four) hours as needed for moderate pain. 12/26/14   Gregor Hams, MD  HYDROcodone-acetaminophen (NORCO/VICODIN) 5-325 MG per tablet Take 1 tablet by mouth every 6 (six) hours as needed for moderate pain. 12/26/14   Gregor Hams, MD  metroNIDAZOLE (FLAGYL) 500 MG tablet Take 1 tablet (500 mg total) by mouth 2 (two) times daily. 12/21/14   Paulette Blanch, MD  nitroGLYCERIN (NITROSTAT) 0.4 MG SL tablet Place 0.4 mg under the tongue every 5 (five) minutes as needed for chest pain.    Historical Provider, MD  ondansetron (ZOFRAN) 4 MG tablet Take 1 tablet (4 mg total) by mouth every 8 (eight) hours as needed for nausea or vomiting. 12/21/14   Paulette Blanch, MD  oxyCODONE-acetaminophen (PERCOCET) 7.5-325 MG per tablet Take 1 tablet by mouth every 4 (four) hours as needed for severe pain. 12/26/14 12/26/15  Gregor Hams, MD  traMADol (ULTRAM) 50 MG tablet Take 1 tablet (50 mg total) by mouth every 6 (six) hours as needed. 12/21/14   Luvenia Starch  Jamal Collin, MD    Allergies as of 02/18/2015 - Review Complete 12/26/2014  Allergen Reaction Noted  . Atenolol  12/26/2014  . Percocet [oxycodone-acetaminophen] Nausea And Vomiting 12/26/2014  . Ultram [tramadol] Itching 12/26/2014  . Penicillins Rash 12/21/2014  . Plavix [clopidogrel bisulfate] Rash 12/21/2014    History reviewed. No pertinent family history.  Social History   Social History  . Marital Status: Married    Spouse Name: N/A  . Number of Children: N/A  . Years of Education: N/A   Occupational History  . Not on file.   Social History Main Topics  . Smoking status: Never Smoker   . Smokeless tobacco: Not on file  . Alcohol Use: No  . Drug Use: No  . Sexual Activity: Not on file   Other  Topics Concern  . Not on file   Social History Narrative    Review of Systems: See HPI, otherwise negative ROS  Physical Exam: BP 156/106 mmHg  Pulse 90  Temp(Src) 97.3 F (36.3 C) (Tympanic)  Resp 12  Ht 5\' 11"  (1.803 m)  Wt 83.008 kg (183 lb)  BMI 25.53 kg/m2  SpO2 100% General:   Alert,  pleasant and cooperative in NAD Head:  Normocephalic and atraumatic. Neck:  Supple; no masses or thyromegaly. Lungs:  Clear throughout to auscultation.    Heart:  Regular rate and rhythm. Abdomen:  Soft, nontender and nondistended. Normal bowel sounds, without guarding, and without rebound.   Neurologic:  Alert and  oriented x4;  grossly normal neurologically.  Impression/Plan: Edwin Olson is here for an colonoscopy to be performed for hx of diverticulitis and rectal bleeding. Risks, benefits, limitations, and alternatives regarding  colonoscopy have been reviewed with the patient.  Questions have been answered.  All parties agreeable.   Tammie Yanda, Lupita Dawn, MD  05/10/2015, 8:28 AM

## 2015-05-10 NOTE — Anesthesia Postprocedure Evaluation (Signed)
  Anesthesia Post-op Note  Patient: Edwin Olson [facility]  Procedure(s) Performed: Procedure(s): COLONOSCOPY WITH PROPOFOL (N/A)  Anesthesia type:General  Patient location: PACU  Post pain: Pain level controlled  Post assessment: Post-op Vital signs reviewed, Patient's Cardiovascular Status Stable, Respiratory Function Stable, Patent Airway and No signs of Nausea or vomiting  Post vital signs: Reviewed and stable  Last Vitals:  Filed Vitals:   05/10/15 0933  BP: 121/82  Pulse: 71  Temp:   Resp: 19    Level of consciousness: awake, alert  and patient cooperative  Complications: No apparent anesthesia complications

## 2015-05-10 NOTE — Op Note (Signed)
[facility] Gastroenterology Patient Name: Edwin Olson Procedure Date: 05/10/2015 9:07 AM MRN: [id number] Account #: [id number] Date of Birth: Feb 23, 1941 Admit Type: Outpatient Age: 74 Room: [facility] ENDO ROOM 4 Gender: Male Note Status: Finalized Procedure:         Colonoscopy Indications:       Rectal bleeding, Personal history of colonic polyps,                     Follow-up of diverticulitis Providers:         Lupita Dawn. Candace Cruise, MD Referring MD:      Leonie Douglas. Doy Hutching, MD (Referring MD) Medicines:         Monitored Anesthesia Care Complications:     No immediate complications. Procedure:         Pre-Anesthesia Assessment:                    - Prior to the procedure, a History and Physical was                     performed, and patient medications, allergies and                     sensitivities were reviewed. The patient's tolerance of                     previous anesthesia was reviewed.                    - The risks and benefits of the procedure and the sedation                     options and risks were discussed with the patient. All                     questions were answered and informed consent was obtained.                    - After reviewing the risks and benefits, the patient was                     deemed in satisfactory condition to undergo the procedure.                    After obtaining informed consent, the colonoscope was                     passed under direct vision. Throughout the procedure, the                     patient's blood pressure, pulse, and oxygen saturations                     were monitored continuously. The Olympus CF-Q160AL                     colonoscope (S#. [id number]) was introduced through the anus                     and advanced to the the cecum, identified by appendiceal                     orifice and ileocecal valve. The colonoscopy was performed  with ease. The patient tolerated the procedure well. The                     quality of the bowel preparation was good. Findings:      Multiple small and large-mouthed diverticula were found in the sigmoid       colon. No signs of infecttion.      A small polyp was found in the proximal sigmoid colon. The polyp was       sessile. The polyp was removed with a cold snare. Resection and       retrieval were complete.      A diminutive polyp was found in the ascending colon. The polyp was       sessile. The polyp was removed with a jumbo cold forceps. Resection and       retrieval were complete.      The exam was otherwise without abnormality. Impression:        - Diverticulosis in the sigmoid colon.                    - One small polyp in the proximal sigmoid colon. Resected                     and retrieved.                    - One diminutive polyp in the ascending colon. Resected                     and retrieved.                    - The examination was otherwise normal. Recommendation:    - Discharge patient to home.                    - Await pathology results.                    - Repeat colonoscopy in 5 years for surveillance based on                     pathology results.                    - The findings and recommendations were discussed with the                     patient.                    - Be caeful of seeds or nuts. Procedure Code(s): --- Professional ---                    [phone], Colonoscopy, flexible; with removal of tumor(s),                     polyp(s), or other lesion(s) by snare technique                    45380, 64, Colonoscopy, flexible; with biopsy, single or                     multiple Diagnosis Code(s): --- Professional ---                    D12.5, Benign neoplasm of sigmoid colon  D12.2, Benign neoplasm of ascending colon                    K62.5, Hemorrhage of anus and rectum                    Z86.010, Personal history of colonic polyps                    K57.32, Diverticulitis of large  intestine without                     perforation or abscess without bleeding                    K57.30, Diverticulosis of large intestine without                     perforation or abscess without bleeding CPT copyright 2014 American Medical Association. All rights reserved. The codes documented in this report are preliminary and upon coder review may  be revised to meet current compliance requirements. Hulen Luster, MD 05/10/2015 9:28:29 AM This report has been signed electronically. Number of Addenda: 0 Note Initiated On: 05/10/2015 9:07 AM Scope Withdrawal Time: 0 hours 7 minutes 51 seconds  Total Procedure Duration: 0 hours 11 minutes 55 seconds       [place]

## 2015-05-10 NOTE — Anesthesia Preprocedure Evaluation (Signed)
Anesthesia Evaluation  Patient identified by MRN, date of birth, ID band Patient awake    Reviewed: Allergy & Precautions, NPO status , Patient's Chart, lab work & pertinent test results  Airway Mallampati: III       Dental  (+) Partial Upper, Partial Lower   Pulmonary neg pulmonary ROS,           Cardiovascular hypertension, Pt. on medications and Pt. on home beta blockers + CAD  Normal cardiovascular exam+ dysrhythmias      Neuro/Psych    GI/Hepatic Neg liver ROS, GERD  ,  Endo/Other  diabetes, Well Controlled, Type 1, Insulin Dependent  Renal/GU negative Renal ROS     Musculoskeletal   Abdominal Normal abdominal exam  (+)   Peds  Hematology negative hematology ROS (+)   Anesthesia Other Findings   Reproductive/Obstetrics                             Anesthesia Physical Anesthesia Plan  ASA: III  Anesthesia Plan: General   Post-op Pain Management:    Induction: Intravenous  Airway Management Planned: Nasal Cannula  Additional Equipment:   Intra-op Plan:   Post-operative Plan:   Informed Consent: I have reviewed the patients History and Physical, chart, labs and discussed the procedure including the risks, benefits and alternatives for the proposed anesthesia with the patient or authorized representative who has indicated his/her understanding and acceptance.     Plan Discussed with: CRNA  Anesthesia Plan Comments:         Anesthesia Quick Evaluation

## 2015-05-13 LAB — SURGICAL PATHOLOGY

## 2015-05-15 ENCOUNTER — Encounter: Payer: Self-pay | Admitting: Gastroenterology

## 2015-08-03 ENCOUNTER — Emergency Department: Payer: Medicare Other

## 2015-08-03 ENCOUNTER — Emergency Department
Admission: EM | Admit: 2015-08-03 | Discharge: 2015-08-03 | Disposition: A | Discharge status: Home / Self Care | Payer: Medicare Other | Attending: Emergency Medicine | Admitting: Emergency Medicine

## 2015-08-03 ENCOUNTER — Encounter: Payer: Self-pay | Admitting: Emergency Medicine

## 2015-08-03 DIAGNOSIS — Z7984 Long term (current) use of oral hypoglycemic drugs: Secondary | ICD-10-CM | POA: Diagnosis not present

## 2015-08-03 DIAGNOSIS — R42 Dizziness and giddiness: Secondary | ICD-10-CM | POA: Insufficient documentation

## 2015-08-03 DIAGNOSIS — Z79899 Other long term (current) drug therapy: Secondary | ICD-10-CM | POA: Insufficient documentation

## 2015-08-03 DIAGNOSIS — R079 Chest pain, unspecified: Secondary | ICD-10-CM | POA: Diagnosis present

## 2015-08-03 DIAGNOSIS — Z880 Allergy status to penicillin: Secondary | ICD-10-CM | POA: Diagnosis not present

## 2015-08-03 DIAGNOSIS — Z792 Long term (current) use of antibiotics: Secondary | ICD-10-CM | POA: Insufficient documentation

## 2015-08-03 DIAGNOSIS — I10 Essential (primary) hypertension: Secondary | ICD-10-CM | POA: Diagnosis not present

## 2015-08-03 DIAGNOSIS — R002 Palpitations: Secondary | ICD-10-CM | POA: Insufficient documentation

## 2015-08-03 DIAGNOSIS — E119 Type 2 diabetes mellitus without complications: Secondary | ICD-10-CM | POA: Insufficient documentation

## 2015-08-03 DIAGNOSIS — I2510 Atherosclerotic heart disease of native coronary artery without angina pectoris: Secondary | ICD-10-CM | POA: Insufficient documentation

## 2015-08-03 DIAGNOSIS — R0602 Shortness of breath: Secondary | ICD-10-CM | POA: Insufficient documentation

## 2015-08-03 DIAGNOSIS — Z7982 Long term (current) use of aspirin: Secondary | ICD-10-CM | POA: Insufficient documentation

## 2015-08-03 DIAGNOSIS — Z794 Long term (current) use of insulin: Secondary | ICD-10-CM | POA: Diagnosis not present

## 2015-08-03 LAB — BASIC METABOLIC PANEL
Anion gap: 9 (ref 5–15)
BUN: 16 mg/dL (ref 6–20)
CALCIUM: 9.2 mg/dL (ref 8.9–10.3)
CHLORIDE: 110 mmol/L (ref 101–111)
CO2: 24 mmol/L (ref 22–32)
CREATININE: 1.09 mg/dL (ref 0.61–1.24)
GFR calc non Af Amer: >60 mL/min (ref >60)
GLUCOSE: 149 mg/dL — AB (ref 65–99)
Potassium: 3.8 mmol/L (ref 3.5–5.1)
Sodium: 143 mmol/L (ref 135–145)

## 2015-08-03 LAB — FIBRIN DERIVATIVES D-DIMER (ARMC ONLY): FIBRIN DERIVATIVES D-DIMER (ARMC): 708 — AB (ref 0–499)

## 2015-08-03 LAB — URINALYSIS COMPLETE WITH MICROSCOPIC (ARMC ONLY)
BACTERIA UA: NONE SEEN
BILIRUBIN URINE: NEGATIVE
GLUCOSE, UA: NEGATIVE mg/dL
Ketones, ur: NEGATIVE mg/dL
Leukocytes, UA: NEGATIVE
NITRITE: NEGATIVE
Protein, ur: NEGATIVE mg/dL
SQUAMOUS EPITHELIAL / LPF: NONE SEEN
Specific Gravity, Urine: 1.008 (ref 1.005–1.030)
WBC UA: NONE SEEN WBC/hpf (ref 0–5)
pH: 6 (ref 5.0–8.0)

## 2015-08-03 LAB — CBC
HCT: 40.9 % (ref 40.0–52.0)
Hemoglobin: 13.8 g/dL (ref 13.0–18.0)
MCH: 30.2 pg (ref 26.0–34.0)
MCHC: 33.9 g/dL (ref 32.0–36.0)
MCV: 89.3 fL (ref 80.0–100.0)
PLATELETS: 220 10*3/uL (ref 150–440)
RBC: 4.58 MIL/uL (ref 4.40–5.90)
RDW: 13.3 % (ref 11.5–14.5)
WBC: 7.6 10*3/uL (ref 3.8–10.6)

## 2015-08-03 LAB — TROPONIN I

## 2015-08-03 LAB — GLUCOSE, CAPILLARY: Glucose-Capillary: 133 mg/dL — ABNORMAL HIGH (ref 65–99)

## 2015-08-03 MED ORDER — IOHEXOL 350 MG/ML SOLN
75.0000 mL | Freq: Once | INTRAVENOUS | Status: AC | PRN
  Administered 2015-08-03: 75 mL via INTRAVENOUS

## 2015-08-03 NOTE — ED Provider Notes (Signed)
[facility] Emergency Department Provider Note  ____________________________________________  Time seen: Approximately 5:00 PM  I have reviewed the triage vital signs and the nursing notes.   HISTORY  Chief Complaint Chest Pain    HPI Edwin Olson is a 75 y.o. male who complains of pain in the right back above the CVA area with movement and also occasional pain in the left shoulder. He's also had intermittent fluttering in his heart area felt fast although when he took his pulse during the fluttering the pulse felt normal. He has noticed these changes especially in the last 3 weeks since he changed from losartan to Micardis. Both of these drugs in the same class. Patient reports he works does not get worse orshort of breath at work he does not have chest pain at work he did not have chest pain with fluttering he does sometimes get short of breath after he sits down after working. Patient was not really short of breath when he had the fluttering either. He does not have any nausea vomiting fever chills or coughing.   Past Medical History  Diagnosis Date  . Diverticulitis   . Diabetes mellitus without complication ([place])   . Hypertension   . Diverticulitis   . Coronary artery disease   . GERD (gastroesophageal reflux disease)   . Arthritis   . Mitral valve insufficiency   . Dysrhythmia     PVC's    There are no active problems to display for this patient.   Past Surgical History  Procedure Laterality Date  . Cardiac stents       cardiac stents   . Back surgery    . Cholecystectomy    . Colonoscopy with propofol N/A 05/10/2015    Procedure: COLONOSCOPY WITH PROPOFOL;  Surgeon: Hulen Luster, MD;  Location: [facility] ENDOSCOPY;  Service: Gastroenterology;  Laterality: N/A;    Current Outpatient Rx  Name  Route  Sig  Dispense  Refill  . aspirin EC 81 MG tablet   Oral   Take 81 mg by mouth daily.         . ciprofloxacin (CIPRO) 500 MG tablet   Oral    Take 1 tablet (500 mg total) by mouth 2 (two) times daily.   14 tablet   0   . diazepam (VALIUM) 5 MG tablet   Oral   Take 5 mg by mouth at bedtime as needed for anxiety.         . gabapentin (NEURONTIN) 600 MG tablet   Oral   Take 600 mg by mouth 2 (two) times daily.          Marland Kitchen HYDROcodone-acetaminophen (NORCO/VICODIN) 5-325 MG per tablet   Oral   Take 1 tablet by mouth every 4 (four) hours as needed for moderate pain.   20 tablet   0   . HYDROcodone-acetaminophen (NORCO/VICODIN) 5-325 MG per tablet   Oral   Take 1 tablet by mouth every 6 (six) hours as needed for moderate pain.   20 tablet   0   . insulin glargine (LANTUS) 100 UNIT/ML injection   Subcutaneous   Inject 55 Units into the skin daily. 20 units in AM and 35 units at bedtime         . losartan (COZAAR) 50 MG tablet   Oral   Take 100 mg by mouth daily.          Marland Kitchen lovastatin (MEVACOR) 40 MG tablet   Oral   Take 40  mg by mouth at bedtime.         . metFORMIN (GLUCOPHAGE) 500 MG tablet   Oral   Take by mouth 2 (two) times daily with a meal.         . metoprolol (LOPRESSOR) 50 MG tablet   Oral   Take 50 mg by mouth daily.          . metroNIDAZOLE (FLAGYL) 500 MG tablet   Oral   Take 1 tablet (500 mg total) by mouth 2 (two) times daily.   14 tablet   0   . nitroGLYCERIN (NITROSTAT) 0.4 MG SL tablet   Sublingual   Place 0.4 mg under the tongue every 5 (five) minutes as needed for chest pain.         Marland Kitchen nystatin (MYCOSTATIN/NYSTOP) 100000 UNIT/GM POWD   Topical   Apply 1 g topically 3 (three) times daily as needed.         Earney Navy Bicarbonate (ZEGERID) 20-1100 MG CAPS capsule   Oral   Take 1 capsule by mouth daily before breakfast.         . ondansetron (ZOFRAN) 4 MG tablet   Oral   Take 1 tablet (4 mg total) by mouth every 8 (eight) hours as needed for nausea or vomiting.   20 tablet   1   . oxyCODONE-acetaminophen (PERCOCET) 7.5-325 MG per tablet   Oral   Take  1 tablet by mouth every 4 (four) hours as needed for severe pain.   20 tablet   0   . traMADol (ULTRAM) 50 MG tablet   Oral   Take 1 tablet (50 mg total) by mouth every 6 (six) hours as needed.   20 tablet   0   . zolpidem (AMBIEN) 5 MG tablet   Oral   Take 5 mg by mouth at bedtime as needed for sleep.           Allergies Atenolol; Percocet; Ultram; Penicillins; and Plavix  History reviewed. No pertinent family history.  Social History Social History  Substance Use Topics  . Smoking status: Never Smoker   . Smokeless tobacco: None  . Alcohol Use: No    Review of Systems Constitutional: No fever/chills Eyes: No visual changes. ENT: No sore throat. Cardiovascular: See history of present illness Respiratory: See history of present illness. Gastrointestinal: No abdominal pain.  No nausea, no vomiting.  No diarrhea.  No constipation. Genitourinary: Negative for dysuria. Musculoskeletal: See history of present illness Skin: Negative for rash. Neurological: Negative for headaches, focal weakness or numbness.  10-point ROS otherwise negative.  ____________________________________________   PHYSICAL EXAM:  VITAL SIGNS: ED Triage Vitals  Enc Vitals Group     BP 08/03/15 1531 168/98 mmHg     Pulse Rate 08/03/15 1531 80     Resp 08/03/15 1531 18     Temp 08/03/15 1531 98.2 F (36.8 C)     Temp Source 08/03/15 1531 Oral     SpO2 08/03/15 1531 97 %     Weight 08/03/15 1531 200 lb (90.719 kg)     Height 08/03/15 1531 5\' 10"  (1.778 m)     Head Cir --      Peak Flow --      Pain Score --      Pain Loc --      Pain Edu? --      Excl. in [place]? --     Constitutional: Alert and oriented. Well appearing and in no acute distress.  Eyes: Conjunctivae are normal. PERRL. EOMI. Head: Atraumatic. Nose: No congestion/rhinnorhea. Mouth/Throat: Mucous membranes are moist.  Oropharynx non-erythematous. Neck: No stridor.   Cardiovascular: Normal rate, regular rhythm. Grossly  normal heart sounds.  Good peripheral circulation. Respiratory: Normal respiratory effort.  No retractions. Lungs CTAB. Chest is tender in the on the right side in the back in the area about the fifth or sixth rib. The tender area covers that this area about the palm of my hand. Palpation there reproduces the pain exactly. 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 Gastrointestinal: Soft and nontender. No distention. No abdominal bruits. No CVA tenderness. Musculoskeletal: No lower extremity tenderness nor edema.  No joint effusions. Left shoulder currently is not tender has good range of motion is no swelling no rash Neurologic:  Normal speech and language. No gross focal neurologic deficits are appreciated. No gait instability. Skin:  Skin is warm, dry and intact. No rash noted. Psychiatric: Mood and affect are normal. Speech and behavior are normal.  ____________________________________________   LABS (all labs ordered are listed, but only abnormal results are displayed)  Labs Reviewed  BASIC METABOLIC PANEL - Abnormal; Notable for the following:    Glucose, Bld 149 (*)    All other components within normal limits  GLUCOSE, CAPILLARY - Abnormal; Notable for the following:    Glucose-Capillary 133 (*)    All other components within normal limits  CBC  TROPONIN I   ____________________________________________  EKG  EKG read and interpreted by me shows normal sinus rhythm a rate of 84 left axis no acute ST-T wave changes computer is reading left anterior hemiblock however ____________________________________________  RADIOLOGY  X-rays read by radiology and reviewed by me shows DJD of the thoracic spine but otherwise no acute  pathology ____________________________________________   PROCEDURES    ____________________________________________   INITIAL IMPRESSION / ASSESSMENT AND PLAN / ED COURSE  Pertinent labs & imaging results that were available during my care of the patient were reviewed by me and considered in my medical decision making (see chart for details).  Patient then tells nurses had several episodes in the last few days where his vision begins graying out and has to pull over when he is driving. ____________________________________________   FINAL CLINICAL IMPRESSION(S) / ED DIAGNOSES  Final diagnoses:  None      Nena Polio, MD 08/03/15 1733

## 2015-08-03 NOTE — Discharge Instructions (Signed)
Palpitations A palpitation is the feeling that your heartbeat is irregular or is faster than normal. It may feel like your heart is fluttering or skipping a beat. Palpitations are usually not a serious problem. However, in some cases, you may need further medical evaluation. CAUSES  Palpitations can be caused by:  Smoking.  Caffeine or other stimulants, such as diet pills or energy drinks.  Alcohol.  Stress and anxiety.  Strenuous physical activity.  Fatigue.  Certain medicines.  Heart disease, especially if you have a history of irregular heart rhythms (arrhythmias), such as atrial fibrillation, atrial flutter, or supraventricular tachycardia.  An improperly working pacemaker or defibrillator. DIAGNOSIS  To find the cause of your palpitations, your health care provider will take your medical history and perform a physical exam. Your health care provider may also have you take a test called an ambulatory electrocardiogram (ECG). An ECG records your heartbeat patterns over a 24-hour period. You may also have other tests, such as:  Transthoracic echocardiogram (TTE). During echocardiography, sound waves are used to evaluate how blood flows through your heart.  Transesophageal echocardiogram (TEE).  Cardiac monitoring. This allows your health care provider to monitor your heart rate and rhythm in real time.  Holter monitor. This is a portable device that records your heartbeat and can help diagnose heart arrhythmias. It allows your health care provider to track your heart activity for several days, if needed.  Stress tests by exercise or by giving medicine that makes the heart beat faster. TREATMENT  Treatment of palpitations depends on the cause of your symptoms and can vary greatly. Most cases of palpitations do not require any treatment other than time, relaxation, and monitoring your symptoms. Other causes, such as atrial fibrillation, atrial flutter, or supraventricular  tachycardia, usually require further treatment. HOME CARE INSTRUCTIONS   Avoid:  Caffeinated coffee, tea, soft drinks, diet pills, and energy drinks.  Chocolate.  Alcohol.  Stop smoking if you smoke.  Reduce your stress and anxiety. Things that can help you relax include:  A method of controlling things in your body, such as your heartbeats, with your mind (biofeedback).  Yoga.  Meditation.  Physical activity such as swimming, jogging, or walking.  Get plenty of rest and sleep. SEEK MEDICAL CARE IF:   You continue to have a fast or irregular heartbeat beyond 24 hours.  Your palpitations occur more often. SEEK IMMEDIATE MEDICAL CARE IF:  You have chest pain or shortness of breath.  You have a severe headache.  You feel dizzy or you faint. MAKE SURE YOU:  Understand these instructions.  Will watch your condition.  Will get help right away if you are not doing well or get worse.   This information is not intended to replace advice given to you by your health care provider. Make sure you discuss any questions you have with your health care provider.   Document Released: 06/12/2000 Document Revised: 06/20/2013 Document Reviewed: 08/14/2011 Elsevier Interactive Patient Education [employer].    Palpitations A palpitation is the feeling that your heartbeat is irregular or is faster than normal. It may feel like your heart is fluttering or skipping a beat. Palpitations are usually not a serious problem. However, in some cases, you may need further medical evaluation. CAUSES  Palpitations can be caused by:  Smoking.  Caffeine or other stimulants, such as diet pills or energy drinks.  Alcohol.  Stress and anxiety.  Strenuous physical activity.  Fatigue.  Certain medicines.  Heart disease, especially if  you have a history of irregular heart rhythms (arrhythmias), such as atrial fibrillation, atrial flutter, or supraventricular tachycardia.  An  improperly working pacemaker or defibrillator. DIAGNOSIS  To find the cause of your palpitations, your health care provider will take your medical history and perform a physical exam. Your health care provider may also have you take a test called an ambulatory electrocardiogram (ECG). An ECG records your heartbeat patterns over a 24-hour period. You may also have other tests, such as:  Transthoracic echocardiogram (TTE). During echocardiography, sound waves are used to evaluate how blood flows through your heart.  Transesophageal echocardiogram (TEE).  Cardiac monitoring. This allows your health care provider to monitor your heart rate and rhythm in real time.  Holter monitor. This is a portable device that records your heartbeat and can help diagnose heart arrhythmias. It allows your health care provider to track your heart activity for several days, if needed.  Stress tests by exercise or by giving medicine that makes the heart beat faster. TREATMENT  Treatment of palpitations depends on the cause of your symptoms and can vary greatly. Most cases of palpitations do not require any treatment other than time, relaxation, and monitoring your symptoms. Other causes, such as atrial fibrillation, atrial flutter, or supraventricular tachycardia, usually require further treatment. HOME CARE INSTRUCTIONS   Avoid:  Caffeinated coffee, tea, soft drinks, diet pills, and energy drinks.  Chocolate.  Alcohol.  Stop smoking if you smoke.  Reduce your stress and anxiety. Things that can help you relax include:  A method of controlling things in your body, such as your heartbeats, with your mind (biofeedback).  Yoga.  Meditation.  Physical activity such as swimming, jogging, or walking.  Get plenty of rest and sleep. SEEK MEDICAL CARE IF:   You continue to have a fast or irregular heartbeat beyond 24 hours.  Your palpitations occur more often. SEEK IMMEDIATE MEDICAL CARE IF:  You have  chest pain or shortness of breath.  You have a severe headache.  You feel dizzy or you faint. MAKE SURE YOU:  Understand these instructions.  Will watch your condition.  Will get help right away if you are not doing well or get worse.   This information is not intended to replace advice given to you by your health care provider. Make sure you discuss any questions you have with your health care provider.   Document Released: 06/12/2000 Document Revised: 06/20/2013 Document Reviewed: 08/14/2011 Elsevier Interactive Patient Education [employer].   Please return for any further episodes of almost passing out. Please call Dr. Nehemiah Massed on Monday morning and tell him urinate in the ER with palpitations and the passing out spells he should go to see her Monday or Tuesday.

## 2015-08-03 NOTE — ED Notes (Signed)
Pt discharged to home.  Family member driving.  Discharge instructions reviewed.  Verbalized understanding.  No questions or concerns at this time.  Teach back verified.  Pt in NAD.  No items left in ED.   

## 2015-08-03 NOTE — ED Notes (Signed)
Patient from home via [facility] with complaint of chest pain. Patient recently had a medication switch from losartan to micardis. Patient noted that he will intermittently have a "fluttering feeling" in his chest after exertion since the medication switch. Patient states that it resolves with rest. Patient states that this episode was more intense than the prior episodes. Hx stents in 2015

## 2016-02-26 ENCOUNTER — Emergency Department: Payer: Medicare Other

## 2016-02-26 ENCOUNTER — Observation Stay
Admission: EM | Admit: 2016-02-26 | Discharge: 2016-02-27 | Disposition: A | Discharge status: Home / Self Care | Payer: Medicare Other | Attending: Internal Medicine | Admitting: Internal Medicine

## 2016-02-26 DIAGNOSIS — E7800 Pure hypercholesterolemia, unspecified: Secondary | ICD-10-CM | POA: Diagnosis not present

## 2016-02-26 DIAGNOSIS — R0789 Other chest pain: Principal | ICD-10-CM | POA: Insufficient documentation

## 2016-02-26 DIAGNOSIS — Z7982 Long term (current) use of aspirin: Secondary | ICD-10-CM | POA: Insufficient documentation

## 2016-02-26 DIAGNOSIS — Z8249 Family history of ischemic heart disease and other diseases of the circulatory system: Secondary | ICD-10-CM | POA: Insufficient documentation

## 2016-02-26 DIAGNOSIS — Z9049 Acquired absence of other specified parts of digestive tract: Secondary | ICD-10-CM | POA: Insufficient documentation

## 2016-02-26 DIAGNOSIS — I340 Nonrheumatic mitral (valve) insufficiency: Secondary | ICD-10-CM | POA: Diagnosis not present

## 2016-02-26 DIAGNOSIS — Z880 Allergy status to penicillin: Secondary | ICD-10-CM | POA: Diagnosis not present

## 2016-02-26 DIAGNOSIS — I200 Unstable angina: Secondary | ICD-10-CM | POA: Diagnosis present

## 2016-02-26 DIAGNOSIS — Z833 Family history of diabetes mellitus: Secondary | ICD-10-CM | POA: Insufficient documentation

## 2016-02-26 DIAGNOSIS — Z79899 Other long term (current) drug therapy: Secondary | ICD-10-CM | POA: Diagnosis not present

## 2016-02-26 DIAGNOSIS — F419 Anxiety disorder, unspecified: Secondary | ICD-10-CM | POA: Diagnosis not present

## 2016-02-26 DIAGNOSIS — R079 Chest pain, unspecified: Secondary | ICD-10-CM

## 2016-02-26 DIAGNOSIS — Z955 Presence of coronary angioplasty implant and graft: Secondary | ICD-10-CM | POA: Diagnosis not present

## 2016-02-26 DIAGNOSIS — E119 Type 2 diabetes mellitus without complications: Secondary | ICD-10-CM | POA: Diagnosis not present

## 2016-02-26 DIAGNOSIS — M1990 Unspecified osteoarthritis, unspecified site: Secondary | ICD-10-CM | POA: Diagnosis not present

## 2016-02-26 DIAGNOSIS — I10 Essential (primary) hypertension: Secondary | ICD-10-CM | POA: Diagnosis not present

## 2016-02-26 DIAGNOSIS — I493 Ventricular premature depolarization: Secondary | ICD-10-CM | POA: Insufficient documentation

## 2016-02-26 DIAGNOSIS — K219 Gastro-esophageal reflux disease without esophagitis: Secondary | ICD-10-CM | POA: Insufficient documentation

## 2016-02-26 DIAGNOSIS — I2510 Atherosclerotic heart disease of native coronary artery without angina pectoris: Secondary | ICD-10-CM | POA: Insufficient documentation

## 2016-02-26 DIAGNOSIS — Z794 Long term (current) use of insulin: Secondary | ICD-10-CM | POA: Diagnosis not present

## 2016-02-26 DIAGNOSIS — Z888 Allergy status to other drugs, medicaments and biological substances status: Secondary | ICD-10-CM | POA: Insufficient documentation

## 2016-02-26 DIAGNOSIS — I700 Atherosclerosis of aorta: Secondary | ICD-10-CM | POA: Diagnosis not present

## 2016-02-26 DIAGNOSIS — Z885 Allergy status to narcotic agent status: Secondary | ICD-10-CM | POA: Diagnosis not present

## 2016-02-26 LAB — CBC
HEMATOCRIT: 42.7 % (ref 40.0–52.0)
Hemoglobin: 14.7 g/dL (ref 13.0–18.0)
MCH: 30.4 pg (ref 26.0–34.0)
MCHC: 34.4 g/dL (ref 32.0–36.0)
MCV: 88.3 fL (ref 80.0–100.0)
PLATELETS: 248 10*3/uL (ref 150–440)
RBC: 4.83 MIL/uL (ref 4.40–5.90)
RDW: 13.6 % (ref 11.5–14.5)
WBC: 8 10*3/uL (ref 3.8–10.6)

## 2016-02-26 LAB — BASIC METABOLIC PANEL
Anion gap: 6 (ref 5–15)
BUN: 16 mg/dL (ref 6–20)
CALCIUM: 9.3 mg/dL (ref 8.9–10.3)
CO2: 27 mmol/L (ref 22–32)
Chloride: 107 mmol/L (ref 101–111)
Creatinine, Ser: 1.17 mg/dL (ref 0.61–1.24)
GFR calc Af Amer: >60 mL/min (ref >60)
Glucose, Bld: 101 mg/dL — ABNORMAL HIGH (ref 65–99)
POTASSIUM: 4.4 mmol/L (ref 3.5–5.1)
SODIUM: 140 mmol/L (ref 135–145)

## 2016-02-26 LAB — TROPONIN I: Troponin I: <0.03 ng/mL (ref <0.03)

## 2016-02-26 LAB — GLUCOSE, CAPILLARY
GLUCOSE-CAPILLARY: 142 mg/dL — AB (ref 65–99)
Glucose-Capillary: 117 mg/dL — ABNORMAL HIGH (ref 65–99)

## 2016-02-26 MED ORDER — ONDANSETRON HCL 4 MG/2ML IJ SOLN
4.0000 mg | Freq: Four times a day (QID) | INTRAMUSCULAR | Status: DC | PRN
  Administered 2016-02-26: 4 mg via INTRAVENOUS
  Filled 2016-02-26: qty 2

## 2016-02-26 MED ORDER — ENOXAPARIN SODIUM 40 MG/0.4ML ~~LOC~~ SOLN
40.0000 mg | SUBCUTANEOUS | Status: DC
  Administered 2016-02-26: 40 mg via SUBCUTANEOUS
  Filled 2016-02-26: qty 0.4

## 2016-02-26 MED ORDER — DIAZEPAM 5 MG PO TABS: 5.0000 mg | ORAL_TABLET | Freq: Every evening | ORAL | Status: DC | PRN

## 2016-02-26 MED ORDER — INSULIN ASPART 100 UNIT/ML ~~LOC~~ SOLN: 0.0000 [IU] | Freq: Every day | SUBCUTANEOUS | Status: DC

## 2016-02-26 MED ORDER — GABAPENTIN 300 MG PO CAPS
600.0000 mg | ORAL_CAPSULE | Freq: Two times a day (BID) | ORAL | Status: DC
  Administered 2016-02-26 – 2016-02-27 (×2): 600 mg via ORAL
  Filled 2016-02-26 (×2): qty 2

## 2016-02-26 MED ORDER — IRBESARTAN 75 MG PO TABS
75.0000 mg | ORAL_TABLET | Freq: Every day | ORAL | Status: DC
Start: 2016-02-27 — End: 2016-02-27
  Administered 2016-02-27: 75 mg via ORAL
  Filled 2016-02-26: qty 1

## 2016-02-26 MED ORDER — GI COCKTAIL ~~LOC~~: 30.0000 mL | Freq: Four times a day (QID) | ORAL | Status: DC | PRN

## 2016-02-26 MED ORDER — ZOLPIDEM TARTRATE 5 MG PO TABS
5.0000 mg | ORAL_TABLET | Freq: Every evening | ORAL | Status: DC | PRN
  Administered 2016-02-26: 5 mg via ORAL
  Filled 2016-02-26: qty 1

## 2016-02-26 MED ORDER — ASPIRIN EC 81 MG PO TBEC
81.0000 mg | DELAYED_RELEASE_TABLET | Freq: Every day | ORAL | Status: DC
  Administered 2016-02-26 – 2016-02-27 (×2): 81 mg via ORAL
  Filled 2016-02-26 (×2): qty 1

## 2016-02-26 MED ORDER — INSULIN GLARGINE 100 UNIT/ML ~~LOC~~ SOLN
50.0000 [IU] | Freq: Every day | SUBCUTANEOUS | Status: DC
  Administered 2016-02-26 – 2016-02-27 (×2): 50 [IU] via SUBCUTANEOUS
  Filled 2016-02-26 (×4): qty 0.5

## 2016-02-26 MED ORDER — PRAVASTATIN SODIUM 10 MG PO TABS: 10.0000 mg | ORAL_TABLET | Freq: Every day | ORAL | Status: DC

## 2016-02-26 MED ORDER — METOPROLOL SUCCINATE ER 50 MG PO TB24
50.0000 mg | ORAL_TABLET | ORAL | Status: DC
  Administered 2016-02-27: 50 mg via ORAL
  Filled 2016-02-26: qty 1

## 2016-02-26 MED ORDER — INSULIN ASPART 100 UNIT/ML ~~LOC~~ SOLN
5.0000 [IU] | Freq: Three times a day (TID) | SUBCUTANEOUS | Status: DC
  Administered 2016-02-27 (×2): 5 [IU] via SUBCUTANEOUS
  Filled 2016-02-26 (×2): qty 5

## 2016-02-26 MED ORDER — HYDRALAZINE HCL 20 MG/ML IJ SOLN
10.0000 mg | Freq: Once | INTRAMUSCULAR | Status: AC
  Administered 2016-02-26: 10 mg via INTRAVENOUS
  Filled 2016-02-26: qty 1

## 2016-02-26 MED ORDER — HYDRALAZINE HCL 20 MG/ML IJ SOLN: 10.0000 mg | Freq: Four times a day (QID) | INTRAMUSCULAR | Status: DC | PRN

## 2016-02-26 MED ORDER — PANTOPRAZOLE SODIUM 40 MG PO TBEC
40.0000 mg | DELAYED_RELEASE_TABLET | Freq: Every day | ORAL | Status: DC
  Administered 2016-02-27: 40 mg via ORAL
  Filled 2016-02-26: qty 1

## 2016-02-26 MED ORDER — HYDROCODONE-ACETAMINOPHEN 5-325 MG PO TABS
1.0000 | ORAL_TABLET | Freq: Four times a day (QID) | ORAL | Status: DC | PRN
  Administered 2016-02-26 – 2016-02-27 (×2): 1 via ORAL
  Filled 2016-02-26 (×2): qty 1

## 2016-02-26 MED ORDER — NITROGLYCERIN 2 % TD OINT
1.0000 [in_us] | TOPICAL_OINTMENT | Freq: Four times a day (QID) | TRANSDERMAL | Status: DC
  Administered 2016-02-26 – 2016-02-27 (×2): 1 [in_us] via TOPICAL
  Filled 2016-02-26: qty 1

## 2016-02-26 MED ORDER — NITROGLYCERIN 0.4 MG SL SUBL: 0.4000 mg | SUBLINGUAL_TABLET | SUBLINGUAL | Status: DC | PRN

## 2016-02-26 MED ORDER — ASPIRIN 81 MG PO CHEW
324.0000 mg | CHEWABLE_TABLET | Freq: Once | ORAL | Status: AC
  Administered 2016-02-26: 324 mg via ORAL
  Filled 2016-02-26: qty 4

## 2016-02-26 MED ORDER — NITROGLYCERIN 2 % TD OINT
TOPICAL_OINTMENT | TRANSDERMAL | Status: AC
  Administered 2016-02-26: 1 [in_us] via TOPICAL
  Filled 2016-02-26: qty 1

## 2016-02-26 MED ORDER — INSULIN ASPART 100 UNIT/ML ~~LOC~~ SOLN: 0.0000 [IU] | Freq: Three times a day (TID) | SUBCUTANEOUS | Status: DC

## 2016-02-26 NOTE — ED Notes (Addendum)
Pt states he has recently started taking Humalog and immediately after taking it he feels funny and has a sharp/shooting pain in his right wrist in the vein that was used for his stent placement. Pt denies N/V and SOB.

## 2016-02-26 NOTE — Progress Notes (Signed)
Patient arrived to 2A Room 254. Patient denies pain and all questions answered. Patient oriented to unit and Fall Safety Plan signed. Skin assessment completed with Crystal RN and skin intact. A&Ox4, VSS, and NSR on verified tele-box #[id number]. Nursing staff will continue to monitor for any changes in patient status. Earleen Reaper, RN

## 2016-02-26 NOTE — ED Notes (Signed)
Pt's BP continues to remain high 190/106. Admitting MD Manuella Ghazi notified. Pt's admission to be changed to ICU.

## 2016-02-26 NOTE — ED Notes (Signed)
RN was prepared to call report but pt's BP was 193/98. Admitting MD Manuella Ghazi notified and medication ordered and administered.

## 2016-02-26 NOTE — H&P (Signed)
[facility] NAME: Edwin Olson    MR#:  [id number]  DATE OF BIRTH:  Sep 20, 1940  DATE OF ADMISSION:  02/26/2016  PRIMARY CARE PHYSICIAN: Idelle Crouch, MD   REQUESTING/REFERRING PHYSICIAN: Carrie Mew, MD  CHIEF COMPLAINT:   Chief Complaint  Patient presents with  . Chest Pain    HISTORY OF PRESENT ILLNESS:  Edwin Olson  is a 75 y.o. male with a known history of CAD, DM, GERD is being admitted for unstable angina. chest pain in the left chest, a scribe is aching pressure. Nonradiating. Not associated with shortness of breath vomiting or diaphoresis. Does get worse with exertion and walking around the house. Also seems to be worse lying flat at night. Has a history of hypertension diabetes hypercholesterolemia and CAD with stents. No alleviating factors. Has not tried his nitroglycerin that he is prescribed. Compliant with all her medications. Pain is intermittent, lasting "a good while "at a time. Patient is unable to quantify.   PAST MEDICAL HISTORY:   Past Medical History:  Diagnosis Date  . Arthritis   . Coronary artery disease   . Diabetes mellitus without complication ([place])   . Diverticulitis   . Diverticulitis   . Dysrhythmia    PVC's  . GERD (gastroesophageal reflux disease)   . Hypertension   . Mitral valve insufficiency     PAST SURGICAL HISTORY:   Past Surgical History:  Procedure Laterality Date  . BACK SURGERY    . cardiac stents      cardiac stents   . CHOLECYSTECTOMY    . COLONOSCOPY WITH PROPOFOL N/A 05/10/2015   Procedure: COLONOSCOPY WITH PROPOFOL;  Surgeon: Hulen Luster, MD;  Location: [name] ENDOSCOPY;  Service: Gastroenterology;  Laterality: N/A;    SOCIAL HISTORY:   Social History  Substance Use Topics  . Smoking status: Never Smoker  . Smokeless tobacco: Not on file  . Alcohol use No    FAMILY HISTORY:  No family history on file. . Coronary artery disease Mother  .  Hypertension Mother  . Diabetes type II Mother  . Heart failure Mother  . Coronary artery disease Father  . Hypertension Father  . Heart attack Father  . Coronary artery disease Brother  . Hypertension Brother  . Heart attack Brother  . Coronary artery disease Son  . Diabetes mellitus Son  . Heart attack Son  . Hypertension Son  DRUG ALLERGIES:   Allergies  Allergen Reactions  . Atenolol   . Percocet [Oxycodone-Acetaminophen] Nausea And Vomiting  . Ultram [Tramadol] Itching  . Penicillins Rash  . Plavix [Clopidogrel Bisulfate] Rash    REVIEW OF SYSTEMS:   Review of Systems  Constitutional: Negative for chills, fever and weight loss.  HENT: Negative for nosebleeds and sore throat.   Eyes: Negative for blurred vision.  Respiratory: Negative for cough, shortness of breath and wheezing.   Cardiovascular: Positive for chest pain. Negative for orthopnea, leg swelling and PND.  Gastrointestinal: Positive for heartburn. Negative for abdominal pain, constipation, diarrhea, nausea and vomiting.  Genitourinary: Negative for dysuria and urgency.  Musculoskeletal: Negative for back pain.  Skin: Negative for rash.  Neurological: Negative for dizziness, speech change, focal weakness and headaches.  Endo/Heme/Allergies: Does not bruise/bleed easily.  Psychiatric/Behavioral: Negative for depression. The patient is nervous/anxious.    MEDICATIONS AT HOME:   Prior to Admission medications   Medication Sig Start Date End Date Taking? Authorizing Provider  metoprolol succinate (TOPROL-XL) 25 MG 24  hr tablet Take 1 tablet by mouth every evening. 09/02/15 09/01/16 Yes Historical Provider, MD  metoprolol succinate (TOPROL-XL) 50 MG 24 hr tablet Take 1 tablet by mouth every morning. 11/04/15  Yes Historical Provider, MD  telmisartan (MICARDIS) 80 MG tablet Take 1 tablet by mouth every morning. 01/06/16 01/05/17 Yes Historical Provider, MD  aspirin EC 81 MG tablet Take 81 mg by mouth daily.     Historical Provider, MD  ciprofloxacin (CIPRO) 500 MG tablet Take 1 tablet (500 mg total) by mouth 2 (two) times daily. 12/21/14   Paulette Blanch, MD  diazepam (VALIUM) 5 MG tablet Take 5 mg by mouth at bedtime as needed for anxiety.    Historical Provider, MD  gabapentin (NEURONTIN) 600 MG tablet Take 600 mg by mouth 2 (two) times daily.     Historical Provider, MD  HYDROcodone-acetaminophen (NORCO/VICODIN) 5-325 MG per tablet Take 1 tablet by mouth every 6 (six) hours as needed for moderate pain. 12/26/14   Gregor Hams, MD  insulin glargine (LANTUS) 100 UNIT/ML injection Inject 55 Units into the skin daily. 20 units in AM and 35 units at bedtime    Historical Provider, MD  losartan (COZAAR) 50 MG tablet Take 100 mg by mouth daily.     Historical Provider, MD  lovastatin (MEVACOR) 40 MG tablet Take 40 mg by mouth at bedtime.    Historical Provider, MD  metFORMIN (GLUCOPHAGE) 500 MG tablet Take by mouth 2 (two) times daily with a meal.    Historical Provider, MD  metoprolol (LOPRESSOR) 50 MG tablet Take 50 mg by mouth daily.     Historical Provider, MD  metroNIDAZOLE (FLAGYL) 500 MG tablet Take 1 tablet (500 mg total) by mouth 2 (two) times daily. 12/21/14   Paulette Blanch, MD  nitroGLYCERIN (NITROSTAT) 0.4 MG SL tablet Place 0.4 mg under the tongue every 5 (five) minutes as needed for chest pain.    Historical Provider, MD  nystatin (MYCOSTATIN/NYSTOP) 100000 UNIT/GM POWD Apply 1 g topically 3 (three) times daily as needed.    Historical Provider, MD  Omeprazole-Sodium Bicarbonate (ZEGERID) 20-1100 MG CAPS capsule Take 1 capsule by mouth daily before breakfast.    Historical Provider, MD  ondansetron (ZOFRAN) 4 MG tablet Take 1 tablet (4 mg total) by mouth every 8 (eight) hours as needed for nausea or vomiting. 12/21/14   Paulette Blanch, MD  traMADol (ULTRAM) 50 MG tablet Take 1 tablet (50 mg total) by mouth every 6 (six) hours as needed. 12/21/14   Paulette Blanch, MD  zolpidem (AMBIEN) 5 MG tablet Take 5 mg by  mouth at bedtime as needed for sleep.    Historical Provider, MD      VITAL SIGNS:  Blood pressure (!) 177/100, pulse 66, temperature 98.1 F (36.7 C), temperature source Oral, resp. rate 18, height 5\' 10"  (1.778 m), weight 87.5 kg (193 lb), SpO2 97 %. PHYSICAL EXAMINATION:  Physical Exam  Constitutional: He is oriented to person, place, and time and well-developed, well-nourished, and in no distress.  HENT:  Head: Normocephalic and atraumatic.  Eyes: Conjunctivae and EOM are normal. Pupils are equal, round, and reactive to light.  Neck: Normal range of motion. Neck supple. No tracheal deviation present. No thyromegaly present.  Cardiovascular: Normal rate, regular rhythm and normal heart sounds.   Pulmonary/Chest: Effort normal and breath sounds normal. No respiratory distress. He has no wheezes. He exhibits no tenderness.  Abdominal: Soft. Bowel sounds are normal. He exhibits no distension. There is  no tenderness.  Musculoskeletal: Normal range of motion.  Neurological: He is alert and oriented to person, place, and time. No cranial nerve deficit.  Skin: Skin is warm and dry. No rash noted.  Psychiatric: Mood and affect normal.   LABORATORY PANEL:   CBC  Recent Labs Lab 02/26/16 1208  WBC 8.0  HGB 14.7  HCT 42.7  PLT 248   ------------------------------------------------------------------------------------------------------------------  Chemistries   Recent Labs Lab 02/26/16 1208  NA 140  K 4.4  CL 107  CO2 27  GLUCOSE 101*  BUN 16  CREATININE 1.17  CALCIUM 9.3   ------------------------------------------------------------------------------------------------------------------  Cardiac Enzymes  Recent Labs Lab 02/26/16 1208  TROPONINI <0.03   ------------------------------------------------------------------------------------------------------------------  RADIOLOGY:  Dg Chest 2 View  Result Date: 02/26/2016 CLINICAL DATA:  75 year old male with  history of left-sided and left lateral chest pain for the past several weeks. EXAM: CHEST  2 VIEW COMPARISON:  Chest x-ray 08/03/2015. FINDINGS: Lung volumes are normal. No consolidative airspace disease. No pleural effusions. No pneumothorax. No pulmonary nodule or mass noted. Pulmonary vasculature and the cardiomediastinal silhouette are within normal limits. Atherosclerosis in the thoracic aorta. IMPRESSION: 1.  No radiographic evidence of acute cardiopulmonary disease. 2. Aortic atherosclerosis. Electronically Signed   By: Vinnie Langton M.D.   On: 02/26/2016 12:38   IMPRESSION AND PLAN:  81 y m with k/h/o CAD, DM, GERD is being admitted for unstable angina  * Unstable angina: serial troponins, myoview in am. Cardio c/s - ASA, Nitro, B-blocker, ARB - monitor on tele  * Uncontrolled HTN: - continue metoprolol, micardis and adjust as need - can consider imdur if BP remains high  * DM: - Check HbA1c and continue insulin - add SSI  * GERD: continue PPI  * Anxiety - continue valium prn     All the records are reviewed and case discussed with ED provider. Management plans discussed with the patient, family and they are in agreement.  CODE STATUS: FULL CDOE  TOTAL TIME TAKING CARE OF THIS PATIENT: 45 minutes.    [facility], Thoms Barthelemy M.D on 02/26/2016 at 3:00 PM  Between 7am to 6pm - Pager - [phone]  After 6pm go to [email or web address] - Proofreader  [facility]  Office  [phone]  CC: Primary care physician; Idelle Crouch, MD   Note: This dictation was prepared with Dragon dictation along with smaller phrase technology. Any transcriptional errors that result from this process are unintentional.

## 2016-02-26 NOTE — ED Provider Notes (Signed)
[facility] Emergency Department Provider Note  ____________________________________________  Time seen: Approximately 2:00 PM  I have reviewed the triage vital signs and the nursing notes.   HISTORY  Chief Complaint Chest Pain  Level 5 caveat:  Portions of the history and physical were unable to be obtained due to the patient's poor historian   HPI Edwin Olson is a 75 y.o. male who complains of chest pain in the left chest, a scribe is aching pressure. Nonradiating. Not associated with shortness of breath vomiting or diaphoresis. Does get worse with exertion and walking around the house. Also seems to be worse lying flat at night. Has a history of hypertension diabetes hypercholesterolemia and CAD with stents. No alleviating factors. Has not tried his nitroglycerin that he is prescribed. Compliant with all her medications. Pain is intermittent, lasting "a good while "at a time. Patient is unable to quantify.     Past Medical History:  Diagnosis Date  . Arthritis   . Coronary artery disease   . Diabetes mellitus without complication ([place])   . Diverticulitis   . Diverticulitis   . Dysrhythmia    PVC's  . GERD (gastroesophageal reflux disease)   . Hypertension   . Mitral valve insufficiency      There are no active problems to display for this patient.    Past Surgical History:  Procedure Laterality Date  . BACK SURGERY    . cardiac stents      cardiac stents   . CHOLECYSTECTOMY    . COLONOSCOPY WITH PROPOFOL N/A 05/10/2015   Procedure: COLONOSCOPY WITH PROPOFOL;  Surgeon: Hulen Luster, MD;  Location: [facility] ENDOSCOPY;  Service: Gastroenterology;  Laterality: N/A;     Prior to Admission medications   Medication Sig Start Date End Date Taking? Authorizing Provider  aspirin EC 81 MG tablet Take 81 mg by mouth daily.    Historical Provider, MD  ciprofloxacin (CIPRO) 500 MG tablet Take 1 tablet (500 mg total) by mouth 2 (two) times daily.  12/21/14   Paulette Blanch, MD  diazepam (VALIUM) 5 MG tablet Take 5 mg by mouth at bedtime as needed for anxiety.    Historical Provider, MD  gabapentin (NEURONTIN) 600 MG tablet Take 600 mg by mouth 2 (two) times daily.     Historical Provider, MD  HYDROcodone-acetaminophen (NORCO/VICODIN) 5-325 MG per tablet Take 1 tablet by mouth every 4 (four) hours as needed for moderate pain. 12/26/14   Gregor Hams, MD  HYDROcodone-acetaminophen (NORCO/VICODIN) 5-325 MG per tablet Take 1 tablet by mouth every 6 (six) hours as needed for moderate pain. 12/26/14   Gregor Hams, MD  insulin glargine (LANTUS) 100 UNIT/ML injection Inject 55 Units into the skin daily. 20 units in AM and 35 units at bedtime    Historical Provider, MD  losartan (COZAAR) 50 MG tablet Take 100 mg by mouth daily.     Historical Provider, MD  lovastatin (MEVACOR) 40 MG tablet Take 40 mg by mouth at bedtime.    Historical Provider, MD  metFORMIN (GLUCOPHAGE) 500 MG tablet Take by mouth 2 (two) times daily with a meal.    Historical Provider, MD  metoprolol (LOPRESSOR) 50 MG tablet Take 50 mg by mouth daily.     Historical Provider, MD  metroNIDAZOLE (FLAGYL) 500 MG tablet Take 1 tablet (500 mg total) by mouth 2 (two) times daily. 12/21/14   Paulette Blanch, MD  nitroGLYCERIN (NITROSTAT) 0.4 MG SL tablet Place 0.4 mg under the  tongue every 5 (five) minutes as needed for chest pain.    Historical Provider, MD  nystatin (MYCOSTATIN/NYSTOP) 100000 UNIT/GM POWD Apply 1 g topically 3 (three) times daily as needed.    Historical Provider, MD  Omeprazole-Sodium Bicarbonate (ZEGERID) 20-1100 MG CAPS capsule Take 1 capsule by mouth daily before breakfast.    Historical Provider, MD  ondansetron (ZOFRAN) 4 MG tablet Take 1 tablet (4 mg total) by mouth every 8 (eight) hours as needed for nausea or vomiting. 12/21/14   Paulette Blanch, MD  traMADol (ULTRAM) 50 MG tablet Take 1 tablet (50 mg total) by mouth every 6 (six) hours as needed. 12/21/14   Paulette Blanch,  MD  zolpidem (AMBIEN) 5 MG tablet Take 5 mg by mouth at bedtime as needed for sleep.    Historical Provider, MD     Allergies Atenolol; Percocet [oxycodone-acetaminophen]; Ultram [tramadol]; Penicillins; and Plavix [clopidogrel bisulfate]   No family history on file.  Social History Social History  Substance Use Topics  . Smoking status: Never Smoker  . Smokeless tobacco: Not on file  . Alcohol use No    Review of Systems  Constitutional:   No fever or chills.  ENT:   No sore throat. No rhinorrhea.Frequent sour regurgitation. Cardiovascular:   Positive as above chest pain. Respiratory:   No dyspnea or cough. Gastrointestinal:   Negative for abdominal pain, vomiting and diarrhea.   Neurological:   Negative for headaches 10-point ROS otherwise negative.  ____________________________________________   PHYSICAL EXAM:  VITAL SIGNS: ED Triage Vitals  Enc Vitals Group     BP 02/26/16 1158 (!) 177/100     Pulse Rate 02/26/16 1158 66     Resp 02/26/16 1158 18     Temp 02/26/16 1158 98.1 F (36.7 C)     Temp Source 02/26/16 1158 Oral     SpO2 02/26/16 1158 97 %     Weight 02/26/16 1159 193 lb (87.5 kg)     Height 02/26/16 1159 5\' 10"  (1.778 m)     Head Circumference --      Peak Flow --      Pain Score 02/26/16 1205 5     Pain Loc --      Pain Edu? --      Excl. in [place]? --     Vital signs reviewed, nursing assessments reviewed.   Constitutional:   Alert and oriented. Well appearing and in no distress. Eyes:   No scleral icterus. No conjunctival pallor. PERRL. EOMI.  No nystagmus. ENT   Head:   Normocephalic and atraumatic.   Nose:   No congestion/rhinnorhea. No septal hematoma   Mouth/Throat:   MMM, no pharyngeal erythema. No peritonsillar mass.    Neck:   No stridor. No SubQ emphysema. No meningismus. Hematological/Lymphatic/Immunilogical:   No cervical lymphadenopathy. Cardiovascular:   RRR. Symmetric bilateral radial and DP pulses.  No murmurs.   Respiratory:   Normal respiratory effort without tachypnea nor retractions. Breath sounds are clear and equal bilaterally. No wheezes/rales/rhonchi.Chest wall nontender Gastrointestinal:   Soft and nontender. Non distended. There is no CVA tenderness.  No rebound, rigidity, or guarding. Genitourinary:   deferred Musculoskeletal:   Nontender with normal range of motion in all extremities. No joint effusions.  No lower extremity tenderness.  No edema. Neurologic:   Normal speech and language.  CN 2-10 normal. Motor grossly intact. No gross focal neurologic deficits are appreciated.  Skin:    Skin is warm, dry and intact. No rash noted.  No petechiae, purpura, or bullae.  ____________________________________________    LABS (pertinent positives/negatives) (all labs ordered are listed, but only abnormal results are displayed) Labs Reviewed  BASIC METABOLIC PANEL - Abnormal; Notable for the following:       Result Value   Glucose, Bld 101 (*)    All other components within normal limits  CBC  TROPONIN I   ____________________________________________   EKG  EKG interpreted by me Normal sinus rhythm rate of 70. Left axis, normal intervals. Normal QRS ST segments and T waves  ____________________________________________    RADIOLOGY  Chest x-ray unremarkable  ____________________________________________   PROCEDURES Procedures  ____________________________________________   INITIAL IMPRESSION / ASSESSMENT AND PLAN / ED COURSE  Pertinent labs & imaging results that were available during my care of the patient were reviewed by me and considered in my medical decision making (see chart for details).  Patient not in distress but having ongoing chest pain. With exertional component and many risk factors including age prior CAD hypertension diabetes hypercholesterolemia, patient is certainly at an elevated risk of MACE. Will give aspirin, trial of nitroglycerin in the ED.  Discussed with hospitalist for further observation and workup. Presentation not consistent with ACS at this time. Not requiring catheter or heparin. Low suspicion for TAD dissection or carditis. No evidence of intra-abdominal or aortic pathology.     Clinical Course   ____________________________________________   FINAL CLINICAL IMPRESSION(S) / ED DIAGNOSES  Final diagnoses:  Nonspecific chest pain       Portions of this note were generated with dragon dictation software. Dictation errors may occur despite best attempts at proofreading.    Carrie Mew, MD 02/26/16 [id number]

## 2016-02-26 NOTE — ED Triage Notes (Signed)
Pt reports CP for the past 2 days. Pt denies SOB but reports pain radiates into his left shoulder and can feel a discomfort in his right wrist. Pt reports hx of cardiac issues and has stents in place.

## 2016-02-27 ENCOUNTER — Observation Stay: Payer: Medicare Other

## 2016-02-27 ENCOUNTER — Other Ambulatory Visit: Payer: Self-pay

## 2016-02-27 ENCOUNTER — Encounter: Payer: Self-pay | Admitting: Radiology

## 2016-02-27 DIAGNOSIS — R0789 Other chest pain: Secondary | ICD-10-CM | POA: Diagnosis not present

## 2016-02-27 LAB — NM MYOCAR MULTI W/SPECT W/WALL MOTION / EF
CSEPPHR: 96 {beats}/min
Estimated workload: 1 METS
Exercise duration (min): 1 min
Exercise duration (sec): 19 s
LV sys vol: 38 mL
LVDIAVOL: 83 mL (ref 62–150)
NUC STRESS TID: 1.31
Rest HR: 65 {beats}/min
SDS: 1
SRS: 0
SSS: 1

## 2016-02-27 LAB — GLUCOSE, CAPILLARY
GLUCOSE-CAPILLARY: 94 mg/dL (ref 65–99)
GLUCOSE-CAPILLARY: 92 mg/dL (ref 65–99)

## 2016-02-27 LAB — TROPONIN I
Troponin I: <0.03 ng/mL (ref <0.03)
Troponin I: <0.03 ng/mL (ref <0.03)

## 2016-02-27 LAB — HEMOGLOBIN A1C: HEMOGLOBIN A1C: 7.1 % — AB (ref 4.0–6.0)

## 2016-02-27 MED ORDER — TECHNETIUM TC 99M TETROFOSMIN IV KIT
29.9270 | PACK | Freq: Once | INTRAVENOUS | Status: AC | PRN
  Administered 2016-02-27: 29.927 via INTRAVENOUS

## 2016-02-27 MED ORDER — TECHNETIUM TC 99M TETROFOSMIN IV KIT
13.0000 | PACK | Freq: Once | INTRAVENOUS | Status: AC | PRN
  Administered 2016-02-27: 13.105 via INTRAVENOUS

## 2016-02-27 MED ORDER — REGADENOSON 0.4 MG/5ML IV SOLN
0.4000 mg | Freq: Once | INTRAVENOUS | Status: AC
  Administered 2016-02-27: 0.4 mg via INTRAVENOUS

## 2016-02-27 NOTE — Care Management Obs Status (Signed)
[place] NOTIFICATION   Patient Details  Name: Edwin Olson MRN: [id number] Date of Birth: Aug 25, 1940   Medicare Observation Status Notification Given:  Yes    Katrina Stack, RN 02/27/2016, 12:09 PM

## 2016-02-27 NOTE — Consult Note (Signed)
Reason for Consult: Angina possibly unstable, known coronary disease Referring Physician: Dr. Manuella Ghazi hospitalist, primary physician is Dr. Maud Deed is an 75 y.o. male.  HPI: Patient's a 75 year old male known coronary disease history of PCI and stent diabetes reflux admitted for unstable angina symptoms with chest discomfort over the last few weeks. Patient was seen by Dr. Nehemiah Massed in set up for outpatient noninvasive cardiac studies the patient states his symptoms persisted and got worse so he finally came to emergency room was admitted for further evaluation. Patient described left sternal chest discomfort moderate in nature slightly worse with exertion no radiation of the pain no blackout spells or syncope no nausea vomiting states to be compliant with his medication. PCI and stents were performed at [facility] in 2015. Patient states to be compliant with medication has multiple risk factors hypertension diabetes hyperlipidemia. Patient now states chest pains better while in the hospital preop for functional study.  Past Medical History:  Diagnosis Date  . Arthritis   . Coronary artery disease   . Diabetes mellitus without complication ([place])   . Diverticulitis   . Diverticulitis   . Dysrhythmia    PVC's  . GERD (gastroesophageal reflux disease)   . Hypertension   . Mitral valve insufficiency     Past Surgical History:  Procedure Laterality Date  . BACK SURGERY    . cardiac stents      cardiac stents   . CHOLECYSTECTOMY    . COLONOSCOPY WITH PROPOFOL N/A 05/10/2015   Procedure: COLONOSCOPY WITH PROPOFOL;  Surgeon: Hulen Luster, MD;  Location: [facility] ENDOSCOPY;  Service: Gastroenterology;  Laterality: N/A;    History reviewed. No pertinent family history.  Social History:  reports that he has never smoked. He has never used smokeless tobacco. He reports that he does not drink alcohol or use drugs.  Allergies:  Allergies  Allergen Reactions  . Atenolol   . Crestor  [Rosuvastatin] Itching  . Percocet [Oxycodone-Acetaminophen] Nausea And Vomiting  . Ultram [Tramadol] Itching  . Penicillins Rash    Has patient had a PCN reaction causing immediate rash, facial/tongue/throat swelling, SOB or lightheadedness with hypotension: Yes Has patient had a PCN reaction causing severe rash involving mucus membranes or skin necrosis: Yes Has patient had a PCN reaction that required hospitalization Yes Has patient had a PCN reaction occurring within the last 10 years: No If all of the above answers are "NO", then may proceed with Cephalosporin use.   Marland Kitchen Plavix [Clopidogrel Bisulfate] Itching and Rash    Medications: I have reviewed the patient's current medications.  Results for orders placed or performed during the hospital encounter of 02/26/16 (from the past 48 hour(s))  Basic metabolic panel     Status: Abnormal   Collection Time: 02/26/16 12:08 PM  Result Value Ref Range   Sodium 140 135 - 145 mmol/L   Potassium 4.4 3.5 - 5.1 mmol/L   Chloride 107 101 - 111 mmol/L   CO2 27 22 - 32 mmol/L   Glucose, Bld 101 (H) 65 - 99 mg/dL   BUN 16 6 - 20 mg/dL   Creatinine, Ser 1.17 0.61 - 1.24 mg/dL   Calcium 9.3 8.9 - 10.3 mg/dL   GFR calc non Af Amer >60 >60 mL/min   GFR calc Af Amer >60 >60 mL/min    Comment: (NOTE) The eGFR has been calculated using the CKD EPI equation. This calculation has not been validated in all clinical situations. eGFR's persistently <60 mL/min signify possible  Chronic Kidney Disease.    Anion gap 6 5 - 15  CBC     Status: None   Collection Time: 02/26/16 12:08 PM  Result Value Ref Range   WBC 8.0 3.8 - 10.6 K/uL   RBC 4.83 4.40 - 5.90 MIL/uL   Hemoglobin 14.7 13.0 - 18.0 g/dL   HCT 42.7 40.0 - 52.0 %   MCV 88.3 80.0 - 100.0 fL   MCH 30.4 26.0 - 34.0 pg   MCHC 34.4 32.0 - 36.0 g/dL   RDW 13.6 11.5 - 14.5 %   Platelets 248 150 - 440 K/uL  Troponin I     Status: None   Collection Time: 02/26/16 12:08 PM  Result Value Ref Range    Troponin I <0.03 <0.03 ng/mL  Glucose, capillary     Status: Abnormal   Collection Time: 02/26/16  6:19 PM  Result Value Ref Range   Glucose-Capillary 117 (H) 65 - 99 mg/dL  Glucose, capillary     Status: Abnormal   Collection Time: 02/26/16  9:09 PM  Result Value Ref Range   Glucose-Capillary 142 (H) 65 - 99 mg/dL  Hemoglobin A1c     Status: Abnormal   Collection Time: 02/26/16  9:49 PM  Result Value Ref Range   Hgb A1c MFr Bld 7.1 (H) 4.0 - 6.0 %  Troponin I-serum (0, 3, 6 hours)     Status: None   Collection Time: 02/26/16  9:49 PM  Result Value Ref Range   Troponin I <0.03 <0.03 ng/mL  Troponin I-serum (0, 3, 6 hours)     Status: None   Collection Time: 02/27/16  2:47 AM  Result Value Ref Range   Troponin I <0.03 <0.03 ng/mL  Troponin I     Status: None   Collection Time: 02/27/16  4:44 AM  Result Value Ref Range   Troponin I <0.03 <0.03 ng/mL  Glucose, capillary     Status: None   Collection Time: 02/27/16  7:55 AM  Result Value Ref Range   Glucose-Capillary 94 65 - 99 mg/dL    Dg Chest 2 View  Result Date: 02/26/2016 CLINICAL DATA:  75 year old male with history of left-sided and left lateral chest pain for the past several weeks. EXAM: CHEST  2 VIEW COMPARISON:  Chest x-ray 08/03/2015. FINDINGS: Lung volumes are normal. No consolidative airspace disease. No pleural effusions. No pneumothorax. No pulmonary nodule or mass noted. Pulmonary vasculature and the cardiomediastinal silhouette are within normal limits. Atherosclerosis in the thoracic aorta. IMPRESSION: 1.  No radiographic evidence of acute cardiopulmonary disease. 2. Aortic atherosclerosis. Electronically Signed   By: Vinnie Langton M.D.   On: 02/26/2016 12:38    Review of Systems  HENT: Negative.   Eyes: Negative.   Respiratory: Positive for shortness of breath.   Cardiovascular: Positive for chest pain and palpitations.  Gastrointestinal: Negative.   Genitourinary: Negative.   Musculoskeletal: Negative.    Skin: Negative.   Neurological: Positive for weakness.  Endo/Heme/Allergies: Negative.   Psychiatric/Behavioral: Negative.    Blood pressure 121/64, pulse 68, temperature 98.6 F (37 C), resp. rate 18, height 5' 10"  (1.778 m), weight 83.7 kg (184 lb 9.6 oz), SpO2 94 %. Physical Exam  Nursing note and vitals reviewed. Constitutional: He is oriented to person, place, and time. He appears well-developed and well-nourished.  HENT:  Head: Normocephalic and atraumatic.  Eyes: Conjunctivae and EOM are normal. Pupils are equal, round, and reactive to light.  Neck: Normal range of motion. Neck supple.  Cardiovascular:  Normal rate and regular rhythm.   Respiratory: Effort normal and breath sounds normal.  GI: Soft. Bowel sounds are normal.  Musculoskeletal: Normal range of motion.  Neurological: He is alert and oriented to person, place, and time. He has normal reflexes.  Skin: Skin is warm and dry.  Psychiatric: He has a normal mood and affect.    Assessment/Plan: Angina Artery disease History of PCI and stent Diabetes type 2 uncomplicated PVCs GERD Hypertension Mitral regurgitation DJD . Plan Agree with rule out myocardial infarction Recommend follow-up EKGs and enzymes Continue current medications Recommend Lexiscan Myoview for evaluation of ischemia Continue beta blocker therapy hypertension angina Agree with volume therapy for mild anxiety Hypertension control with losartan and metoprolol Continue diabetes management Lantus and metformin GERD symptom control currently on omeprazole Consider echocardiogram for evaluation of left ventricular function Patient scheduled for carotid Dopplers for ASCVD sclerotic vascular disease evaluation If cardiac studies and negative that the patient follow-up with Dr. Nehemiah Massed as an outpatient 2 weeks  CALLWOOD,DWAYNE D. 02/27/2016, 11:38 AM

## 2016-02-27 NOTE — Progress Notes (Signed)
Patient discharged home as ordered,instructions explained and well understood,follow up appointment made,belongings with patient,nitroglycerine(patient's own med.given),escorted by this nurse and spouse via wheel chair.

## 2016-02-27 NOTE — Care Management (Addendum)
Placed in observation for symptoms concerning for TIA.  Patient has verbalized concerns due to cost of his medications: Advair, Spiriva, Eliquis as he is in the "donut hole."  Provided patient with information regarding  [facility], coupons for [employer], patient assistance applications for [employer] and [employer].  Instructed patient and his daughter to make an appointment with [facility] for assistance completing the applications.  Patient will be referred to outpatient therapies at [facility].  He already had an appointment for PT 9/13.  Adding OT to the referral.

## 2016-02-27 NOTE — Discharge Instructions (Signed)

## 2016-02-28 NOTE — Discharge Summary (Signed)
Edwin Olson, 75 y.o., DOB January 23, 1941, MRN [id number]. Admission date: 02/26/2016 Discharge Date 02/28/2016 Primary MD Idelle Crouch, MD Admitting Physician Max Sane, MD  Admission Diagnosis  Nonspecific chest pain [R07.9]  Discharge Diagnosis   Active Problems:  Chest pain noncardiac unspecified previous h/o of  coronary artery disease Osteoarthritis Diabetes GERD Essential hypertension       Hospital Course Edwin Olson  is a 75 y.o. male with a known history of CAD, DM, GERD is being admitted for unstable angina. chest pain in the left chest, a scribe is aching pressure. Nonradiating. Not associated with shortness of breath vomiting or diaphoresis. Patient was admitted overnight. He was seen by cardioogy by and had stress test  Which was negative. Patient's symptoms were felt to be noncardiac. He is doing much better stable for discharge. Patient's feels that his symptoms may be related to short acting insulin I recommended he follow-up with his endocrinologist for further recommendations.           Consults  cardiology  Significant Tests:  See full reports for all details     Dg Chest 2 View  Result Date: 02/26/2016 CLINICAL DATA:  75 year old male with history of left-sided and left lateral chest pain for the past several weeks. EXAM: CHEST  2 VIEW COMPARISON:  Chest x-ray 08/03/2015. FINDINGS: Lung volumes are normal. No consolidative airspace disease. No pleural effusions. No pneumothorax. No pulmonary nodule or mass noted. Pulmonary vasculature and the cardiomediastinal silhouette are within normal limits. Atherosclerosis in the thoracic aorta. IMPRESSION: 1.  No radiographic evidence of acute cardiopulmonary disease. 2. Aortic atherosclerosis. Electronically Signed   By: Vinnie Langton M.D.   On: 02/26/2016 12:38   Nm Myocar Multi W/spect W/wall Motion / Ef  Result Date: 02/27/2016  There was no ST segment deviation noted during stress.  No T wave inversion  was noted during stress.  Normal Lexiscan pharmacologic stress no evidence of ischemia  Normal overall left ventricular function  Low-risk scan  The study is normal.  This is a low risk study.  The left ventricular ejection fraction is mildly decreased (45-54%).        Today   Subjective:   Edwin Olson  Patient with no further cp  Objective:   Blood pressure (!) 158/80, pulse 68, temperature 97.7 F (36.5 C), temperature source Oral, resp. rate 16, height 5\' 10"  (1.778 m), weight 184 lb 9.6 oz (83.7 kg), SpO2 98 %.  .  Intake/Output Summary (Last 24 hours) at 02/28/16 1352 Last data filed at 02/27/16 1359  Gross per 24 hour  Intake              480 ml  Output                0 ml  Net              480 ml    Exam VITAL SIGNS: Blood pressure (!) 158/80, pulse 68, temperature 97.7 F (36.5 C), temperature source Oral, resp. rate 16, height 5\' 10"  (1.778 m), weight 184 lb 9.6 oz (83.7 kg), SpO2 98 %.  GENERAL:  75 y.o.-year-old patient lying in the bed with no acute distress.  EYES: Pupils equal, round, reactive to light and accommodation. No scleral icterus. Extraocular muscles intact.  HEENT: Head atraumatic, normocephalic. Oropharynx and nasopharynx clear.  NECK:  Supple, no jugular venous distention. No thyroid enlargement, no tenderness.  LUNGS: Normal breath sounds bilaterally, no wheezing, rales,rhonchi or crepitation. No use of accessory  muscles of respiration.  CARDIOVASCULAR: S1, S2 normal. No murmurs, rubs, or gallops.  ABDOMEN: Soft, nontender, nondistended. Bowel sounds present. No organomegaly or mass.  EXTREMITIES: No pedal edema, cyanosis, or clubbing.  NEUROLOGIC: Cranial nerves II through XII are intact. Muscle strength 5/5 in all extremities. Sensation intact. Gait not checked.  PSYCHIATRIC: The patient is alert and oriented x 3.  SKIN: No obvious rash, lesion, or ulcer.   Data Review     CBC w Diff: Lab Results  Component Value Date   WBC 8.0  02/26/2016   HGB 14.7 02/26/2016   HGB 14.7 10/31/2013   HCT 42.7 02/26/2016   HCT 43.5 10/31/2013   PLT 248 02/26/2016   PLT 256 10/31/2013   LYMPHOPCT 13 12/25/2014   LYMPHOPCT 27.5 10/27/2013   MONOPCT 7 12/25/2014   MONOPCT 9.4 10/27/2013   EOSPCT 5 12/25/2014   EOSPCT 4.2 10/27/2013   BASOPCT 4 12/25/2014   BASOPCT 0.9 10/27/2013   CMP: Lab Results  Component Value Date   NA 140 02/26/2016   NA 142 10/31/2013   K 4.4 02/26/2016   K 4.1 10/31/2013   CL 107 02/26/2016   CL 108 (H) 10/31/2013   CO2 27 02/26/2016   CO2 28 10/31/2013   BUN 16 02/26/2016   BUN 15 10/31/2013   CREATININE 1.17 02/26/2016   CREATININE 0.99 10/31/2013   PROT 7.2 12/25/2014   PROT 7.0 02/13/2013   ALBUMIN 4.1 12/25/2014   ALBUMIN 3.7 02/13/2013   BILITOT 0.3 12/25/2014   BILITOT 0.3 02/13/2013   ALKPHOS 73 12/25/2014   ALKPHOS 75 02/13/2013   AST 36 12/25/2014   AST 30 02/13/2013   ALT 35 12/25/2014   ALT 51 02/13/2013  .  Micro Results No results found for this or any previous visit (from the past 240 hour(s)).   Code Status History    Date Active Date Inactive Code Status Order ID Comments User Context   02/26/2016  8:57 PM 02/27/2016  7:10 PM Full Code [id number]  Max Sane, MD Inpatient    Advance Directive Documentation   Flowsheet Row Most Recent Value  Type of Advance Directive  Living will, Healthcare Power of Attorney  Pre-existing out of facility DNR order (yellow form or pink MOST form)  No data  "MOST" Form in Place?  No data          Follow-up Information    SPARKS,JEFFREY D, MD Follow up in 7 day(s).   Specialty:  Internal Medicine Why:  Tuesday, September 12th at 330pm, ccs Contact information: [place] [address] [phone]           Discharge Medications     Medication List    TAKE these medications   aspirin EC 81 MG tablet Take 81 mg by mouth daily.   ciprofloxacin 500 MG tablet Commonly  known as:  CIPRO Take 1 tablet (500 mg total) by mouth 2 (two) times daily.   diazepam 5 MG tablet Commonly known as:  VALIUM Take 5 mg by mouth at bedtime as needed for anxiety.   gabapentin 600 MG tablet Commonly known as:  NEURONTIN Take 600 mg by mouth 2 (two) times daily.   HYDROcodone-acetaminophen 5-325 MG tablet Commonly known as:  NORCO/VICODIN Take 1 tablet by mouth every 6 (six) hours as needed for moderate pain.   insulin glargine 100 UNIT/ML injection Commonly known as:  LANTUS Inject 50 Units into the skin daily. 50 units at 2100  insulin lispro 100 UNIT/ML injection Commonly known as:  HUMALOG Inject 15-22 Units into the skin 2 (two) times daily. 22 units in the morning and 15 units at night   lovastatin 40 MG tablet Commonly known as:  MEVACOR Take 40 mg by mouth at bedtime.   metFORMIN 500 MG tablet Commonly known as:  GLUCOPHAGE Take by mouth 2 (two) times daily with a meal.   metoprolol succinate 25 MG 24 hr tablet Commonly known as:  TOPROL-XL Take 1 tablet by mouth every evening.   metoprolol succinate 50 MG 24 hr tablet Commonly known as:  TOPROL-XL Take 1 tablet by mouth every morning.   metroNIDAZOLE 500 MG tablet Commonly known as:  FLAGYL Take 1 tablet (500 mg total) by mouth 2 (two) times daily.   nitroGLYCERIN 0.4 MG SL tablet Commonly known as:  NITROSTAT Place 0.4 mg under the tongue every 5 (five) minutes as needed for chest pain.   telmisartan 80 MG tablet Commonly known as:  MICARDIS Take 1 tablet by mouth every morning.   traMADol 50 MG tablet Commonly known as:  ULTRAM Take 1 tablet (50 mg total) by mouth every 6 (six) hours as needed.   ZEGERID 20-1100 MG Caps capsule Generic drug:  Omeprazole-Sodium Bicarbonate Take 1 capsule by mouth daily before breakfast.   zolpidem 5 MG tablet Commonly known as:  AMBIEN Take 5 mg by mouth at bedtime as needed for sleep.          Total Time in preparing paper work, data  evaluation and todays exam - 35 minutes  Dustin Flock M.D on 02/28/2016 at [place] PM  [facility]   Office  [phone]

## 2016-07-04 ENCOUNTER — Other Ambulatory Visit: Payer: Self-pay | Admitting: Primary Care

## 2018-04-07 ENCOUNTER — Other Ambulatory Visit: Payer: Self-pay

## 2018-04-07 ENCOUNTER — Emergency Department
Admission: EM | Admit: 2018-04-07 | Discharge: 2018-04-07 | Disposition: A | Discharge status: Home / Self Care | Payer: Medicare Other | Attending: Emergency Medicine | Admitting: Emergency Medicine

## 2018-04-07 DIAGNOSIS — I2510 Atherosclerotic heart disease of native coronary artery without angina pectoris: Secondary | ICD-10-CM | POA: Diagnosis not present

## 2018-04-07 DIAGNOSIS — E119 Type 2 diabetes mellitus without complications: Secondary | ICD-10-CM | POA: Diagnosis not present

## 2018-04-07 DIAGNOSIS — Z79899 Other long term (current) drug therapy: Secondary | ICD-10-CM | POA: Diagnosis not present

## 2018-04-07 DIAGNOSIS — I10 Essential (primary) hypertension: Secondary | ICD-10-CM | POA: Insufficient documentation

## 2018-04-07 DIAGNOSIS — Z7982 Long term (current) use of aspirin: Secondary | ICD-10-CM | POA: Insufficient documentation

## 2018-04-07 DIAGNOSIS — Z794 Long term (current) use of insulin: Secondary | ICD-10-CM | POA: Diagnosis not present

## 2018-04-07 DIAGNOSIS — M546 Pain in thoracic spine: Secondary | ICD-10-CM | POA: Insufficient documentation

## 2018-04-07 DIAGNOSIS — M549 Dorsalgia, unspecified: Secondary | ICD-10-CM | POA: Diagnosis present

## 2018-04-07 MED ORDER — DOXYCYCLINE HYCLATE 100 MG PO CAPS: 100.0000 mg | ORAL_CAPSULE | Freq: Two times a day (BID) | ORAL | 0 refills | Status: DC

## 2018-04-07 MED ORDER — LIDOCAINE 5 % EX PTCH
2.0000 | MEDICATED_PATCH | CUTANEOUS | Status: DC
  Administered 2018-04-07: 2 via TRANSDERMAL
  Filled 2018-04-07: qty 2

## 2018-04-07 MED ORDER — CYCLOBENZAPRINE HCL 10 MG PO TABS: 10.0000 mg | ORAL_TABLET | Freq: Three times a day (TID) | ORAL | 0 refills | Status: DC | PRN

## 2018-04-07 MED ORDER — HYDROMORPHONE HCL 4 MG/ML IJ SOLN: 100.0000 mg | Freq: Once | INTRAMUSCULAR | Status: DC

## 2018-04-07 MED ORDER — CYCLOBENZAPRINE HCL 10 MG PO TABS
10.0000 mg | ORAL_TABLET | Freq: Once | ORAL | Status: AC
  Administered 2018-04-07: 10 mg via ORAL
  Filled 2018-04-07: qty 1

## 2018-04-07 NOTE — ED Triage Notes (Addendum)
Pt arrives to ED via [facility] from home with c/o mid/lower back pain x2 days. Pt reports h/x of same, stated to EMS that he was lifting a 55 gal drum into his pick-up truck prior to back pain starting. No loss of bowel or bladder control. Pt is A&O, in NAD; RR even, regular, and unlabored.

## 2018-04-12 NOTE — ED Provider Notes (Signed)
[facility] Emergency Department Provider Note    First MD Initiated Contact with Patient 04/07/18 [phone]     (approximate)  I have reviewed the triage vital signs and the nursing notes.   HISTORY  Chief Complaint Back Pain    HPI Edwin Olson is a 77 y.o. male with below list of chronic medical conditions including chronic back pain presents to the emergency department with complaint of mid back pain which began after lifting a 55 gallon drum to place in the back of his pickup truck.  Patient states that pain is worse with any movement.  Patient denies any lower extremity weakness or numbness.  Patient denies any bowel or bladder habit changes. Past Medical History:  Diagnosis Date  . Arthritis   . Coronary artery disease   . Diabetes mellitus without complication ([place])   . Diverticulitis   . Diverticulitis   . Dysrhythmia    PVC's  . GERD (gastroesophageal reflux disease)   . Hypertension   . Mitral valve insufficiency     Patient Active Problem List   Diagnosis Date Noted  . Unstable angina ([place]) 02/26/2016    Past Surgical History:  Procedure Laterality Date  . BACK SURGERY    . cardiac stents      cardiac stents   . CHOLECYSTECTOMY    . COLONOSCOPY WITH PROPOFOL N/A 05/10/2015   Procedure: COLONOSCOPY WITH PROPOFOL;  Surgeon: Hulen Luster, MD;  Location: [facility] ENDOSCOPY;  Service: Gastroenterology;  Laterality: N/A;    Prior to Admission medications   Medication Sig Start Date End Date Taking? Authorizing Provider  aspirin EC 81 MG tablet Take 81 mg by mouth daily.    [provider]  ciprofloxacin (CIPRO) 500 MG tablet Take 1 tablet (500 mg total) by mouth 2 (two) times daily. Patient not taking: Reported on 02/26/2016 12/21/14   Paulette Blanch, MD  cyclobenzaprine (FLEXERIL) 10 MG tablet Take 1 tablet (10 mg total) by mouth 3 (three) times daily as needed. 04/07/18   Gregor Hams, MD  diazepam (VALIUM) 5 MG tablet Take 5 mg  by mouth at bedtime as needed for anxiety.    [provider]  doxycycline (VIBRAMYCIN) 100 MG capsule Take 1 capsule (100 mg total) by mouth 2 (two) times daily. 04/07/18   Gregor Hams, MD  gabapentin (NEURONTIN) 600 MG tablet Take 600 mg by mouth 2 (two) times daily.     [provider]  HYDROcodone-acetaminophen (NORCO/VICODIN) 5-325 MG per tablet Take 1 tablet by mouth every 6 (six) hours as needed for moderate pain. 12/26/14   Gregor Hams, MD  insulin glargine (LANTUS) 100 UNIT/ML injection Inject 50 Units into the skin daily. 50 units at 2100    [provider]  insulin lispro (HUMALOG) 100 UNIT/ML injection Inject 15-22 Units into the skin 2 (two) times daily. 22 units in the morning and 15 units at night    [provider]  lovastatin (MEVACOR) 40 MG tablet Take 40 mg by mouth at bedtime.    [provider]  metFORMIN (GLUCOPHAGE) 500 MG tablet Take by mouth 2 (two) times daily with a meal.    [provider]  metoprolol succinate (TOPROL-XL) 25 MG 24 hr tablet Take 1 tablet by mouth every evening. 09/02/15 09/01/16  [provider]  metoprolol succinate (TOPROL-XL) 50 MG 24 hr tablet Take 1 tablet by mouth every morning. 11/04/15   [provider]  metroNIDAZOLE (FLAGYL) 500 MG  tablet Take 1 tablet (500 mg total) by mouth 2 (two) times daily. Patient not taking: Reported on 02/26/2016 12/21/14   Paulette Blanch, MD  nitroGLYCERIN (NITROSTAT) 0.4 MG SL tablet Place 0.4 mg under the tongue every 5 (five) minutes as needed for chest pain.    [provider]  Omeprazole-Sodium Bicarbonate (ZEGERID) 20-1100 MG CAPS capsule Take 1 capsule by mouth daily before breakfast.    [provider]  telmisartan (MICARDIS) 80 MG tablet Take 1 tablet by mouth every morning. 01/06/16 01/05/17  [provider]  traMADol (ULTRAM) 50 MG tablet Take 1 tablet (50 mg total) by mouth every 6 (six) hours as  needed. Patient not taking: Reported on 02/26/2016 12/21/14   Paulette Blanch, MD  zolpidem (AMBIEN) 5 MG tablet Take 5 mg by mouth at bedtime as needed for sleep.    [provider]    Allergies Atenolol; Crestor [rosuvastatin]; Percocet [oxycodone-acetaminophen]; Ultram [tramadol]; Penicillins; and Plavix [clopidogrel bisulfate]  No family history on file.  Social History Social History   Tobacco Use  . Smoking status: Never Smoker  . Smokeless tobacco: Never Used  Substance Use Topics  . Alcohol use: No  . Drug use: No    Review of Systems Constitutional: No fever/chills Eyes: No visual changes. ENT: No sore throat. Cardiovascular: Denies chest pain. Respiratory: Denies shortness of breath. Gastrointestinal: No abdominal pain.  No nausea, no vomiting.  No diarrhea.  No constipation. Genitourinary: Negative for dysuria. Musculoskeletal: Negative for neck pain.  Positive for back pain. Integumentary: Negative for rash. Neurological: Negative for headaches, focal weakness or numbness.   ____________________________________________   PHYSICAL EXAM:  VITAL SIGNS: ED Triage Vitals  Enc Vitals Group     BP 04/07/18 0518 (!) 149/79     Pulse Rate 04/07/18 0518 74     Resp 04/07/18 0518 18     Temp 04/07/18 0518 98.3 F (36.8 C)     Temp Source 04/07/18 0518 Oral     SpO2 04/07/18 0518 97 %     Weight 04/07/18 0519 86.6 kg (191 lb)     Height 04/07/18 0519 1.778 m (5\' 10" )     Head Circumference --      Peak Flow --      Pain Score 04/07/18 0519 8     Pain Loc --      Pain Edu? --      Excl. in [place]? --     Constitutional: Alert and oriented. Well appearing and in no acute distress. Eyes: Conjunctivae are normal.  Head: Atraumatic. Mouth/Throat: Mucous membranes are moist. Oropharynx non-erythematous. Neck: No stridor.   Cardiovascular: Normal rate, regular rhythm. Good peripheral circulation. Grossly normal heart sounds. Respiratory: Normal respiratory  effort.  No retractions. Lungs CTAB. Gastrointestinal: Soft and nontender. No distention.  Musculoskeletal: No lower extremity tenderness nor edema. No gross deformities of extremities.  Pain with paraspinal thoracic/lumbar muscle palpation Neurologic:  Normal speech and language. No gross focal neurologic deficits are appreciated.  Skin:  Skin is warm, dry and intact. No rash noted. Psychiatric: Mood and affect are normal. Speech and behavior are normal.     Procedures   ____________________________________________   INITIAL IMPRESSION / ASSESSMENT AND PLAN / ED COURSE  As part of my medical decision making, I reviewed the following data within the electronic MEDICAL RECORD NUMBER   77 year old male presenting with the above-stated history and physical exam of low back pain following lifting a heavy object.  Suspect muscle strain.  Patient with no lower extremity neurological deficit.  No saddle anesthesia or any indication of cauda equina syndrome. ____________________________________________  FINAL CLINICAL IMPRESSION(S) / ED DIAGNOSES  Final diagnoses:  Acute bilateral thoracic back pain     MEDICATIONS GIVEN DURING THIS VISIT:  Medications  cyclobenzaprine (FLEXERIL) tablet 10 mg (10 mg Oral Given 04/07/18 0532)     ED Discharge Orders         Ordered    cyclobenzaprine (FLEXERIL) 10 MG tablet  3 times daily PRN     04/07/18 0609    doxycycline (VIBRAMYCIN) 100 MG capsule  2 times daily     04/07/18 0636           Note:  This document was prepared using Dragon voice recognition software and may include unintentional dictation errors.    Gregor Hams, MD 04/12/18 2249

## 2019-10-25 ENCOUNTER — Other Ambulatory Visit (HOSPITAL_COMMUNITY): Payer: Self-pay | Admitting: Physical Medicine & Rehabilitation

## 2019-10-25 ENCOUNTER — Other Ambulatory Visit: Payer: Self-pay | Admitting: Physical Medicine & Rehabilitation

## 2019-10-25 DIAGNOSIS — M5416 Radiculopathy, lumbar region: Secondary | ICD-10-CM

## 2019-10-30 ENCOUNTER — Other Ambulatory Visit: Payer: Self-pay

## 2019-10-30 ENCOUNTER — Ambulatory Visit
Admission: RE | Admit: 2019-10-30 | Discharge: 2019-10-30 | Disposition: A | Discharge status: Home / Self Care | Payer: Medicare Other | Source: Ambulatory Visit | Attending: Physical Medicine & Rehabilitation | Admitting: Physical Medicine & Rehabilitation

## 2019-10-30 DIAGNOSIS — M5416 Radiculopathy, lumbar region: Secondary | ICD-10-CM | POA: Diagnosis not present

## 2019-10-30 MED ORDER — GADOBUTROL 1 MMOL/ML IV SOLN
8.0000 mL | Freq: Once | INTRAVENOUS | Status: AC | PRN
  Administered 2019-10-30: 8 mL via INTRAVENOUS

## 2020-02-16 ENCOUNTER — Encounter: Payer: Self-pay | Admitting: Emergency Medicine

## 2020-02-16 ENCOUNTER — Emergency Department: Payer: Medicare Other

## 2020-02-16 ENCOUNTER — Emergency Department
Admission: EM | Admit: 2020-02-16 | Discharge: 2020-02-16 | Disposition: A | Discharge status: Home / Self Care | Payer: Medicare Other | Attending: Emergency Medicine | Admitting: Emergency Medicine

## 2020-02-16 ENCOUNTER — Other Ambulatory Visit: Payer: Self-pay

## 2020-02-16 DIAGNOSIS — R519 Headache, unspecified: Secondary | ICD-10-CM

## 2020-02-16 DIAGNOSIS — I10 Essential (primary) hypertension: Secondary | ICD-10-CM | POA: Insufficient documentation

## 2020-02-16 DIAGNOSIS — I2510 Atherosclerotic heart disease of native coronary artery without angina pectoris: Secondary | ICD-10-CM | POA: Insufficient documentation

## 2020-02-16 DIAGNOSIS — Z7982 Long term (current) use of aspirin: Secondary | ICD-10-CM | POA: Diagnosis not present

## 2020-02-16 DIAGNOSIS — Z794 Long term (current) use of insulin: Secondary | ICD-10-CM | POA: Insufficient documentation

## 2020-02-16 DIAGNOSIS — Z79899 Other long term (current) drug therapy: Secondary | ICD-10-CM | POA: Insufficient documentation

## 2020-02-16 DIAGNOSIS — E119 Type 2 diabetes mellitus without complications: Secondary | ICD-10-CM | POA: Diagnosis not present

## 2020-02-16 NOTE — ED Provider Notes (Signed)
[facility] Emergency Department Provider Note   ____________________________________________    I have reviewed the triage vital signs and the nursing notes.   HISTORY  Chief Complaint Headache     HPI Edwin Olson is a 79 y.o. male with history of CAD, diabetes, hypertension who presents with complaints of mild headache as well as decreased vision particularly in his left eye.  Patient reports about a week ago he noted blurry vision which seemed new for him, typically states his eyesight is quite good.  Complains of some pain in his head and the back of his neck.  No injuries falls or trauma.  No history of CVA.  No weakness or numbness.  No nausea or vomiting.  Past Medical History:  Diagnosis Date  . Arthritis   . Coronary artery disease   . Diabetes mellitus without complication ([place])   . Diverticulitis   . Diverticulitis   . Dysrhythmia    PVC's  . GERD (gastroesophageal reflux disease)   . Hypertension   . Mitral valve insufficiency     Patient Active Problem List   Diagnosis Date Noted  . Unstable angina ([place]) 02/26/2016    Past Surgical History:  Procedure Laterality Date  . BACK SURGERY    . cardiac stents      cardiac stents   . CHOLECYSTECTOMY    . COLONOSCOPY WITH PROPOFOL N/A 05/10/2015   Procedure: COLONOSCOPY WITH PROPOFOL;  Surgeon: Hulen Luster, MD;  Location: [facility] ENDOSCOPY;  Service: Gastroenterology;  Laterality: N/A;    Prior to Admission medications   Medication Sig Start Date End Date Taking? Authorizing Provider  aspirin EC 81 MG tablet Take 81 mg by mouth daily.    [provider]  ciprofloxacin (CIPRO) 500 MG tablet Take 1 tablet (500 mg total) by mouth 2 (two) times daily. Patient not taking: Reported on 02/26/2016 12/21/14   Paulette Blanch, MD  cyclobenzaprine (FLEXERIL) 10 MG tablet Take 1 tablet (10 mg total) by mouth 3 (three) times daily as needed. 04/07/18   Gregor Hams, MD  diazepam  (VALIUM) 5 MG tablet Take 5 mg by mouth at bedtime as needed for anxiety.    [provider]  doxycycline (VIBRAMYCIN) 100 MG capsule Take 1 capsule (100 mg total) by mouth 2 (two) times daily. 04/07/18   Gregor Hams, MD  gabapentin (NEURONTIN) 600 MG tablet Take 600 mg by mouth 2 (two) times daily.     [provider]  HYDROcodone-acetaminophen (NORCO/VICODIN) 5-325 MG per tablet Take 1 tablet by mouth every 6 (six) hours as needed for moderate pain. 12/26/14   Gregor Hams, MD  insulin glargine (LANTUS) 100 UNIT/ML injection Inject 50 Units into the skin daily. 50 units at 2100    [provider]  insulin lispro (HUMALOG) 100 UNIT/ML injection Inject 15-22 Units into the skin 2 (two) times daily. 22 units in the morning and 15 units at night    [provider]  lovastatin (MEVACOR) 40 MG tablet Take 40 mg by mouth at bedtime.    [provider]  metFORMIN (GLUCOPHAGE) 500 MG tablet Take by mouth 2 (two) times daily with a meal.    [provider]  metoprolol succinate (TOPROL-XL) 25 MG 24 hr tablet Take 1 tablet by mouth every evening. 09/02/15 09/01/16  [provider]  metoprolol succinate (TOPROL-XL) 50 MG 24 hr tablet Take 1 tablet by mouth every morning. 11/04/15   [provider]  metroNIDAZOLE (FLAGYL) 500 MG tablet Take 1 tablet (500 mg total) by mouth 2 (two) times daily. Patient not taking: Reported on 02/26/2016 12/21/14   Paulette Blanch, MD  nitroGLYCERIN (NITROSTAT) 0.4 MG SL tablet Place 0.4 mg under the tongue every 5 (five) minutes as needed for chest pain.    [provider]  Omeprazole-Sodium Bicarbonate (ZEGERID) 20-1100 MG CAPS capsule Take 1 capsule by mouth daily before breakfast.    [provider]  telmisartan (MICARDIS) 80 MG tablet Take 1 tablet by mouth every morning. 01/06/16 01/05/17  [provider]  traMADol (ULTRAM) 50 MG tablet Take 1 tablet (50 mg total) by mouth every 6  (six) hours as needed. Patient not taking: Reported on 02/26/2016 12/21/14   Paulette Blanch, MD  zolpidem (AMBIEN) 5 MG tablet Take 5 mg by mouth at bedtime as needed for sleep.    [provider]     Allergies Clonidine, Glipizide, Atorvastatin, Crestor [rosuvastatin], Doxycycline, Isosorbide, Meclizine, Percocet [oxycodone-acetaminophen], Ultram [tramadol], Amlodipine, Atenolol, Clopidogrel, Lisinopril, Losartan, Metaxalone, Penicillins, and Plavix [clopidogrel bisulfate]  No family history on file.  Social History Social History   Tobacco Use  . Smoking status: Never Smoker  . Smokeless tobacco: Never Used  Substance Use Topics  . Alcohol use: No  . Drug use: No    Review of Systems  Constitutional: No fever/chills Eyes: As above ENT: No sore throat. Cardiovascular: Denies chest pain. Respiratory: Denies shortness of breath. Gastrointestinal: No abdominal pain.  No nausea, no vomiting.   Genitourinary: Negative for dysuria. Musculoskeletal: Negative for back pain. Skin: Negative for rash. Neurological: As above   ____________________________________________   PHYSICAL EXAM:  VITAL SIGNS: ED Triage Vitals  Enc Vitals Group     BP 02/16/20 1000 (!) 144/87     Pulse Rate 02/16/20 1000 76     Resp 02/16/20 1000 18     Temp 02/16/20 1000 98.2 F (36.8 C)     Temp Source 02/16/20 1000 Oral     SpO2 02/16/20 1000 97 %     Weight 02/16/20 0930 83.5 kg (184 lb)     Height 02/16/20 0930 1.778 m (5\' 10" )     Head Circumference --      Peak Flow --      Pain Score 02/16/20 0929 8     Pain Loc --      Pain Edu? --      Excl. in [place]? --     Constitutional: Alert and oriented.  Eyes: Conjunctivae are normal.  PERRLA, EOMI, ocular pressures normal Head: Atraumatic. Nose: No congestion/rhinnorhea. Mouth/Throat: Mucous membranes are moist.    Cardiovascular: Normal rate, regular rhythm.   Good peripheral circulation. Respiratory: Normal respiratory effort.  No  retractions.   Musculoskeletal: No lower extremity tenderness nor edema.  Warm and well perfused Neurologic:  Normal speech and language. No gross focal neurologic deficits are appreciated.  Cranial nerves II through XII normal Skin:  Skin is warm, dry and intact. No rash noted. Psychiatric: Mood and affect are normal. Speech and behavior are normal.  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  Patient presents with change in vision as noted above, mild headache.  Symptoms started over a week ago.  No other neuro deficits.  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  Have ordered MRI to evaluate further  MRI demonstrates only chronic infarct, ocular pressures normal  Appropriate for follow-up with ophthalmology which he already has scheduled    ____________________________________________   FINAL CLINICAL IMPRESSION(S) / ED DIAGNOSES  Final diagnoses:  Acute nonintractable headache, unspecified headache type        Note:  This document was prepared using Dragon voice recognition software and may include unintentional dictation  errors.   Lavonia Drafts, MD 02/16/20 [phone]

## 2020-02-16 NOTE — ED Triage Notes (Signed)
Headache since last Wednesday with blurred vision with bending over.  It seemed to start when one of his meds were stopped.  It has not gotten better, so he went to [facility] and they brought him over here.

## 2020-02-16 NOTE — ED Notes (Signed)
Pt ambulatory to restroom, steady gait.

## 2020-02-24 ENCOUNTER — Emergency Department: Payer: Medicare Other

## 2020-02-24 ENCOUNTER — Emergency Department
Admission: EM | Admit: 2020-02-24 | Discharge: 2020-02-24 | Disposition: A | Discharge status: Home / Self Care | Payer: Medicare Other | Attending: Emergency Medicine | Admitting: Emergency Medicine

## 2020-02-24 ENCOUNTER — Other Ambulatory Visit: Payer: Self-pay

## 2020-02-24 DIAGNOSIS — Z7982 Long term (current) use of aspirin: Secondary | ICD-10-CM | POA: Insufficient documentation

## 2020-02-24 DIAGNOSIS — Z79899 Other long term (current) drug therapy: Secondary | ICD-10-CM | POA: Diagnosis not present

## 2020-02-24 DIAGNOSIS — Z794 Long term (current) use of insulin: Secondary | ICD-10-CM | POA: Insufficient documentation

## 2020-02-24 DIAGNOSIS — K219 Gastro-esophageal reflux disease without esophagitis: Secondary | ICD-10-CM

## 2020-02-24 DIAGNOSIS — E119 Type 2 diabetes mellitus without complications: Secondary | ICD-10-CM | POA: Diagnosis not present

## 2020-02-24 DIAGNOSIS — I10 Essential (primary) hypertension: Secondary | ICD-10-CM | POA: Insufficient documentation

## 2020-02-24 DIAGNOSIS — R0789 Other chest pain: Secondary | ICD-10-CM | POA: Diagnosis present

## 2020-02-24 DIAGNOSIS — I2510 Atherosclerotic heart disease of native coronary artery without angina pectoris: Secondary | ICD-10-CM | POA: Insufficient documentation

## 2020-02-24 LAB — CBC
HCT: 40.8 % (ref 39.0–52.0)
Hemoglobin: 13.4 g/dL (ref 13.0–17.0)
MCH: 30.3 pg (ref 26.0–34.0)
MCHC: 32.8 g/dL (ref 30.0–36.0)
MCV: 92.3 fL (ref 80.0–100.0)
Platelets: 237 10*3/uL (ref 150–400)
RBC: 4.42 MIL/uL (ref 4.22–5.81)
RDW: 13.3 % (ref 11.5–15.5)
WBC: 6.8 10*3/uL (ref 4.0–10.5)
nRBC: 0 % (ref 0.0–0.2)

## 2020-02-24 LAB — BASIC METABOLIC PANEL
Anion gap: 8 (ref 5–15)
BUN: 15 mg/dL (ref 8–23)
CO2: 23 mmol/L (ref 22–32)
Calcium: 9.6 mg/dL (ref 8.9–10.3)
Chloride: 105 mmol/L (ref 98–111)
Creatinine, Ser: 1.42 mg/dL — ABNORMAL HIGH (ref 0.61–1.24)
GFR calc Af Amer: 55 mL/min — ABNORMAL LOW (ref >60)
GFR calc non Af Amer: 47 mL/min — ABNORMAL LOW (ref >60)
Glucose, Bld: 171 mg/dL — ABNORMAL HIGH (ref 70–99)
Potassium: 3.9 mmol/L (ref 3.5–5.1)
Sodium: 136 mmol/L (ref 135–145)

## 2020-02-24 LAB — TROPONIN I (HIGH SENSITIVITY)
Troponin I (High Sensitivity): 13 ng/L (ref <18)
Troponin I (High Sensitivity): 17 ng/L (ref <18)

## 2020-02-24 NOTE — ED Triage Notes (Signed)
Patient reports woke around midnight with indigestion feeling and blood pressure going up and down.  Also states in his right arm feels like bugs are crawling on it.

## 2020-02-24 NOTE — Discharge Instructions (Signed)
Double your GERD medication your Zegerid to 40 mg daily.  You may take this as a combined single daily dose or morning and night.  Check your blood pressures throughout the day both when you are feeling good as well as if you are having other symptoms.  Keep a log over the next 2 weeks to determine what your baseline blood pressure radius.  If your epigastric pressure and burning sensation improves with the increased omeprazole no further follow-up is necessary.  If the increased dosing does not improve your symptoms, follow-up primary care.  If your blood pressure stabilizes after you feel better, no follow-up is needed.  If you have consistently elevated blood pressure readings after your stomach symptoms improve, follow-up with primary care.

## 2020-02-24 NOTE — ED Notes (Signed)
Pt is going to call wife and have his bring BP medication so that he can take them.

## 2020-02-24 NOTE — ED Notes (Signed)
See triage note  Presents with epigastric pain and elevated b/p  States this discomfort woke him up during the night  States he is feeling"shakey"

## 2020-02-24 NOTE — ED Provider Notes (Addendum)
[facility] Emergency Department Provider Note  ____________________________________________  Time seen: Approximately 2:03 PM  I have reviewed the triage vital signs and the nursing notes.   HISTORY  Chief Complaint Chest Pain    HPI Edwin Olson is a 79 y.o. male who presents the emergency department complaining of epigastric/inferior chest pressure and burning sensation.  Patient states that last night he was lying down, had a bloated/full sensation in the epigastric region.  This progressed to a pressure sensation and he did have some burning sensation into the lower chest.  Patient states that he has had some issues at nighttime when laying down with this sensation.  Last night was the worst so he presents the emergency department for evaluation.  Patient states that he is also noted that his blood pressure which has been well maintained on metoprolol and Micardis has been varying.  Patient states that he typically notices that his blood pressure is elevated when he has this bloated/pressure sensation in the epigastric region.  Patient does have a history of GERD.  States that he has been on omeprazole for a long period with good results.  Patient is not changed or altered his diet or eating habits.  Patient currently is asymptomatic.         Past Medical History:  Diagnosis Date   Arthritis    Coronary artery disease    Diabetes mellitus without complication ([place])    Diverticulitis    Diverticulitis    Dysrhythmia    PVC's   GERD (gastroesophageal reflux disease)    Hypertension    Mitral valve insufficiency     Patient Active Problem List   Diagnosis Date Noted   Unstable angina ([place]) 02/26/2016    Past Surgical History:  Procedure Laterality Date   BACK SURGERY     cardiac stents      cardiac stents    CHOLECYSTECTOMY     COLONOSCOPY WITH PROPOFOL N/A 05/10/2015   Procedure: COLONOSCOPY WITH PROPOFOL;  Surgeon: Hulen Luster,  MD;  Location: [facility] ENDOSCOPY;  Service: Gastroenterology;  Laterality: N/A;    Prior to Admission medications   Medication Sig Start Date End Date Taking? Authorizing Provider  aspirin EC 81 MG tablet Take 81 mg by mouth daily.    [provider]  diazepam (VALIUM) 5 MG tablet Take 5 mg by mouth at bedtime as needed for anxiety.    [provider]  doxycycline (VIBRAMYCIN) 100 MG capsule Take 1 capsule (100 mg total) by mouth 2 (two) times daily. 04/07/18   Gregor Hams, MD  gabapentin (NEURONTIN) 600 MG tablet Take 600 mg by mouth 2 (two) times daily.     [provider]  insulin glargine (LANTUS) 100 UNIT/ML injection Inject 50 Units into the skin daily. 50 units at 2100    [provider]  insulin lispro (HUMALOG) 100 UNIT/ML injection Inject 15-22 Units into the skin 2 (two) times daily. 22 units in the morning and 15 units at night    [provider]  lovastatin (MEVACOR) 40 MG tablet Take 40 mg by mouth at bedtime.    [provider]  metFORMIN (GLUCOPHAGE) 500 MG tablet Take by mouth 2 (two) times daily with a meal.    [provider]  metoprolol succinate (TOPROL-XL) 25 MG 24 hr tablet Take 1 tablet by mouth every evening. 09/02/15 09/01/16  [provider]  metoprolol succinate (TOPROL-XL) 50 MG 24 hr tablet Take 1 tablet by mouth  every morning. 11/04/15   [provider]  nitroGLYCERIN (NITROSTAT) 0.4 MG SL tablet Place 0.4 mg under the tongue every 5 (five) minutes as needed for chest pain.    [provider]  Omeprazole-Sodium Bicarbonate (ZEGERID) 20-1100 MG CAPS capsule Take 1 capsule by mouth daily before breakfast.    [provider]  telmisartan (MICARDIS) 80 MG tablet Take 1 tablet by mouth every morning. 01/06/16 01/05/17  [provider]  zolpidem (AMBIEN) 5 MG tablet Take 5 mg by mouth at bedtime as needed for sleep.    [provider]    Allergies Clonidine,  Glipizide, Atorvastatin, Crestor [rosuvastatin], Doxycycline, Isosorbide, Meclizine, Percocet [oxycodone-acetaminophen], Ultram [tramadol], Amlodipine, Atenolol, Clopidogrel, Lisinopril, Losartan, Metaxalone, Penicillins, and Plavix [clopidogrel bisulfate]  No family history on file.  Social History Social History   Tobacco Use   Smoking status: Never Smoker   Smokeless tobacco: Never Used  Substance Use Topics   Alcohol use: No   Drug use: No     Review of Systems  Constitutional: No fever/chills Eyes: No visual changes. No discharge ENT: No upper respiratory complaints. Cardiovascular: Burning chest pain associated with epigastric complaints.  No symptoms currently Respiratory: no cough. No SOB. Gastrointestinal: Epigastric abdominal pain when laying down, none currently.  No nausea, no vomiting.  No diarrhea.  No constipation. Musculoskeletal: Negative for musculoskeletal pain. Skin: Negative for rash, abrasions, lacerations, ecchymosis. Neurological: Negative for headaches, focal weakness or numbness. 10-point ROS otherwise negative.  ____________________________________________   PHYSICAL EXAM:  VITAL SIGNS: ED Triage Vitals  Enc Vitals Group     BP 02/24/20 0641 (!) 176/88     Pulse Rate 02/24/20 0641 89     Resp 02/24/20 0641 18     Temp 02/24/20 0641 (!) 97.4 F (36.3 C)     Temp Source 02/24/20 0641 Oral     SpO2 02/24/20 0641 98 %     Weight 02/24/20 0640 186 lb (84.4 kg)     Height 02/24/20 0640 5\' 10"  (1.778 m)     Head Circumference --      Peak Flow --      Pain Score 02/24/20 0640 5     Pain Loc --      Pain Edu? --      Excl. in [place]? --      Constitutional: Alert and oriented. Well appearing and in no acute distress. Eyes: Conjunctivae are normal. PERRL. EOMI. Head: Atraumatic. ENT:      Ears:       Nose: No congestion/rhinnorhea.      Mouth/Throat: Mucous membranes are moist.  Neck: No stridor.    Cardiovascular: Normal rate, regular  rhythm. Normal S1 and S2.  Good peripheral circulation. Respiratory: Normal respiratory effort without tachypnea or retractions. Lungs CTAB. Good air entry to the bases with no decreased or absent breath sounds. Gastrointestinal: Bowel sounds 4 quadrants. Soft and nontender to palpation. No guarding or rigidity. No palpable masses. No distention. No CVA tenderness. Musculoskeletal: Full range of motion to all extremities. No gross deformities appreciated. Neurologic:  Normal speech and language. No gross focal neurologic deficits are appreciated.  Skin:  Skin is warm, dry and intact. No rash noted. Psychiatric: Mood and affect are normal. Speech and behavior are normal. Patient exhibits appropriate insight and judgement.   ____________________________________________   LABS (all labs ordered are listed, but only abnormal results are displayed)  Labs Reviewed  BASIC METABOLIC PANEL - Abnormal; Notable for the following components:  Result Value   Glucose, Bld 171 (*)    Creatinine, Ser 1.42 (*)    GFR calc non Af Amer 47 (*)    GFR calc Af Amer 55 (*)    All other components within normal limits  CBC  TROPONIN I (HIGH SENSITIVITY)  TROPONIN I (HIGH SENSITIVITY)   ____________________________________________  EKG  ED ECG REPORT I, Charline Bills Raynesha Tiedt,  personally viewed and interpreted this ECG.   Date: 02/24/2020  EKG Time: 0643 hrs.  Rate: 84 bpm  Rhythm: unchanged from previous tracings, normal sinus rhythm  Axis: Left axis deviation  Intervals:left anterior fascicular block  ST&T Change: Nonspecific T wave changes.  No ST elevation or depression noted  Normal sinus rhythm.  No STEMI.   ____________________________________________  RADIOLOGY I personally viewed and evaluated these images as part of my medical decision making, as well as reviewing the written report by the radiologist.  DG Chest 2 View  Result Date: 02/24/2020 CLINICAL DATA:  Chest pain EXAM:  CHEST - 2 VIEW COMPARISON:  February 26, 2016 FINDINGS: The heart size and mediastinal contours are within normal limits. Both lungs are clear. The visualized skeletal structures are unremarkable. IMPRESSION: No active cardiopulmonary disease. Electronically Signed   By: Dorise Bullion III M.D   On: 02/24/2020 08:26    ____________________________________________    PROCEDURES  Procedure(s) performed:    Procedures    Medications - No data to display   ____________________________________________   INITIAL IMPRESSION / ASSESSMENT AND PLAN / ED COURSE  Pertinent labs & imaging results that were available during my care of the patient were reviewed by me and considered in my medical decision making (see chart for details).  Review of the [place] CSRS was performed in accordance of the [place] prior to dispensing any controlled drugs.           Patient's diagnosis is consistent with GERD, hypertension.  Patient presents emergency department with approximately 2-week history of worsening epigastric pain/bloating at nighttime.  Patient states that the symptoms only are present when laying down.  He states that last night the pressure was worse than normal and he had a burning sensation in his chest.  Symptoms are currently gone.  He states that he has noticed that his blood pressure has been fluctuating as well but when asked, patient states that he typically checks his blood pressure when he does not feel well and notices that the blood pressure rises when he does not feel well.  Patient states that previous lead to this, his blood pressures been well maintained on his current regimen of metoprolol and Micardis.  At this time I feel that symptoms are likely GERD related.  Patient labs, imaging, EKG was reassuring at this time.  I have encouraged the patient to double his omeprazole to 40 mg a day.  If this settles down his GI symptoms he should continue this dose.  If patient's blood pressure  issues resolve with his stomach problems have been better managed, he does not need to follow-up with primary care for his blood pressure.  At this time I would not change his blood pressure medications as again he states that this only goes up when he does not feel well.  I have encouraged patient to keep a log of his blood pressures with both feeling okay as well as if he does not feel well to trend and find what his true baseline blood pressure is currently.  Return precautions are discussed with the  patient.  He will follow up with primary care of GI symptoms her blood pressure does not resolve..Patient is given ED precautions to return to the ED for any worsening or new symptoms.     ____________________________________________  FINAL CLINICAL IMPRESSION(S) / ED DIAGNOSES  Final diagnoses:  Gastroesophageal reflux disease, unspecified whether esophagitis present  Essential hypertension      NEW MEDICATIONS STARTED DURING THIS VISIT:  ED Discharge Orders    None          This chart was dictated using voice recognition software/Dragon. Despite best efforts to proofread, errors can occur which can change the meaning. Any change was purely unintentional.    Darletta Moll, PA-C 02/24/20 1410    Kendarius Vigen, Charline Bills, PA-C 02/24/20 1416    Vladimir Crofts, MD 02/24/20 [phone]

## 2020-02-26 ENCOUNTER — Ambulatory Visit: Payer: Medicare Other | Attending: Internal Medicine

## 2020-02-26 DIAGNOSIS — Z23 Encounter for immunization: Secondary | ICD-10-CM

## 2020-02-26 NOTE — Progress Notes (Signed)
   Covid-19 Vaccination Clinic  Name:  ALEXANDRE FARIES    MRN: [id number] DOB: Nov 24, 1940  02/26/2020  Mr. Cones was observed post Covid-19 immunization for 15 minutes without incident. He was provided with Vaccine Information Sheet and instruction to access the V-Safe system.   Mr. Fellman was instructed to call 911 with any severe reactions post vaccine: Marland Kitchen Difficulty breathing  . Swelling of face and throat  . A fast heartbeat  . A bad rash all over body  . Dizziness and weakness

## 2020-03-05 ENCOUNTER — Other Ambulatory Visit: Payer: Self-pay | Admitting: Internal Medicine

## 2020-03-05 DIAGNOSIS — R1032 Left lower quadrant pain: Secondary | ICD-10-CM

## 2020-03-27 ENCOUNTER — Ambulatory Visit
Admission: RE | Admit: 2020-03-27 | Discharge: 2020-03-27 | Disposition: A | Discharge status: Home / Self Care | Payer: Medicare Other | Source: Ambulatory Visit | Attending: Internal Medicine | Admitting: Internal Medicine

## 2020-03-27 ENCOUNTER — Other Ambulatory Visit: Payer: Self-pay

## 2020-03-27 DIAGNOSIS — R1032 Left lower quadrant pain: Secondary | ICD-10-CM | POA: Insufficient documentation

## 2020-03-27 MED ORDER — IOHEXOL 300 MG/ML  SOLN
100.0000 mL | Freq: Once | INTRAMUSCULAR | Status: AC | PRN
  Administered 2020-03-27: 100 mL via INTRAVENOUS

## 2020-05-10 ENCOUNTER — Other Ambulatory Visit: Payer: Self-pay

## 2020-05-10 ENCOUNTER — Encounter: Payer: Self-pay | Admitting: Emergency Medicine

## 2020-05-10 ENCOUNTER — Emergency Department: Payer: Medicare Other

## 2020-05-10 ENCOUNTER — Emergency Department
Admission: EM | Admit: 2020-05-10 | Discharge: 2020-05-11 | Disposition: A | Discharge status: Home / Self Care | Payer: Medicare Other | Attending: Emergency Medicine | Admitting: Emergency Medicine

## 2020-05-10 DIAGNOSIS — R1012 Left upper quadrant pain: Secondary | ICD-10-CM | POA: Diagnosis not present

## 2020-05-10 DIAGNOSIS — I10 Essential (primary) hypertension: Secondary | ICD-10-CM | POA: Diagnosis not present

## 2020-05-10 DIAGNOSIS — Z79899 Other long term (current) drug therapy: Secondary | ICD-10-CM | POA: Insufficient documentation

## 2020-05-10 DIAGNOSIS — R0602 Shortness of breath: Secondary | ICD-10-CM | POA: Insufficient documentation

## 2020-05-10 DIAGNOSIS — M79602 Pain in left arm: Secondary | ICD-10-CM | POA: Diagnosis not present

## 2020-05-10 DIAGNOSIS — Z5321 Procedure and treatment not carried out due to patient leaving prior to being seen by health care provider: Secondary | ICD-10-CM | POA: Diagnosis not present

## 2020-05-10 NOTE — ED Triage Notes (Signed)
Patient presents to Emergency Department via [facility] from home with complaints of left arm pain and L & RUQ pain 8/10 that resolved with loose bowel movements   History of HTN and takes 50 mg metoprolol BID, and telmisrtan, and Imdur  Rescue had a BP of 220, EMS 179/96 AFIB  Rate 60-80s, 95 % RA  Pt also reports SOB when lying down after this happened

## 2020-05-10 NOTE — ED Triage Notes (Signed)
Pt denies pain att but reports feeling "a little lightheaded"  Pt without respiratory distress and CMS intact

## 2020-05-11 LAB — CBC
HCT: 37.9 % — ABNORMAL LOW (ref 39.0–52.0)
Hemoglobin: 12.4 g/dL — ABNORMAL LOW (ref 13.0–17.0)
MCH: 29.4 pg (ref 26.0–34.0)
MCHC: 32.7 g/dL (ref 30.0–36.0)
MCV: 89.8 fL (ref 80.0–100.0)
Platelets: 245 10*3/uL (ref 150–400)
RBC: 4.22 MIL/uL (ref 4.22–5.81)
RDW: 13.7 % (ref 11.5–15.5)
WBC: 6.9 10*3/uL (ref 4.0–10.5)
nRBC: 0 % (ref 0.0–0.2)

## 2020-05-11 LAB — BASIC METABOLIC PANEL
Anion gap: 12 (ref 5–15)
BUN: 14 mg/dL (ref 8–23)
CO2: 21 mmol/L — ABNORMAL LOW (ref 22–32)
Calcium: 8.8 mg/dL — ABNORMAL LOW (ref 8.9–10.3)
Chloride: 107 mmol/L (ref 98–111)
Creatinine, Ser: 1.27 mg/dL — ABNORMAL HIGH (ref 0.61–1.24)
GFR, Estimated: 58 mL/min — ABNORMAL LOW (ref >60)
Glucose, Bld: 176 mg/dL — ABNORMAL HIGH (ref 70–99)
Potassium: 3.6 mmol/L (ref 3.5–5.1)
Sodium: 140 mmol/L (ref 135–145)

## 2020-05-11 LAB — TROPONIN I (HIGH SENSITIVITY): Troponin I (High Sensitivity): 12 ng/L (ref <18)

## 2020-05-11 LAB — PROTIME-INR
INR: 1 (ref 0.8–1.2)
Prothrombin Time: 13 seconds (ref 11.4–15.2)

## 2020-05-11 NOTE — ED Notes (Signed)
Informed by registration that pt felt better and was leaving to go home at this time - this RN was giving meds in triage # 1

## 2020-05-29 ENCOUNTER — Emergency Department: Payer: Medicare Other

## 2020-05-29 ENCOUNTER — Other Ambulatory Visit: Payer: Self-pay

## 2020-05-29 ENCOUNTER — Emergency Department
Admission: EM | Admit: 2020-05-29 | Discharge: 2020-05-29 | Disposition: A | Discharge status: Home / Self Care | Payer: Medicare Other | Attending: Emergency Medicine | Admitting: Emergency Medicine

## 2020-05-29 DIAGNOSIS — R1012 Left upper quadrant pain: Secondary | ICD-10-CM | POA: Diagnosis present

## 2020-05-29 DIAGNOSIS — I25110 Atherosclerotic heart disease of native coronary artery with unstable angina pectoris: Secondary | ICD-10-CM | POA: Insufficient documentation

## 2020-05-29 DIAGNOSIS — Z7984 Long term (current) use of oral hypoglycemic drugs: Secondary | ICD-10-CM | POA: Diagnosis not present

## 2020-05-29 DIAGNOSIS — Z7982 Long term (current) use of aspirin: Secondary | ICD-10-CM | POA: Insufficient documentation

## 2020-05-29 DIAGNOSIS — Z79899 Other long term (current) drug therapy: Secondary | ICD-10-CM | POA: Insufficient documentation

## 2020-05-29 DIAGNOSIS — R0789 Other chest pain: Secondary | ICD-10-CM | POA: Insufficient documentation

## 2020-05-29 DIAGNOSIS — Z794 Long term (current) use of insulin: Secondary | ICD-10-CM | POA: Diagnosis not present

## 2020-05-29 DIAGNOSIS — E119 Type 2 diabetes mellitus without complications: Secondary | ICD-10-CM | POA: Insufficient documentation

## 2020-05-29 DIAGNOSIS — I10 Essential (primary) hypertension: Secondary | ICD-10-CM | POA: Diagnosis not present

## 2020-05-29 LAB — BASIC METABOLIC PANEL
Anion gap: 10 (ref 5–15)
BUN: 13 mg/dL (ref 8–23)
CO2: 25 mmol/L (ref 22–32)
Calcium: 9.3 mg/dL (ref 8.9–10.3)
Chloride: 104 mmol/L (ref 98–111)
Creatinine, Ser: 1.36 mg/dL — ABNORMAL HIGH (ref 0.61–1.24)
GFR, Estimated: 54 mL/min — ABNORMAL LOW (ref >60)
Glucose, Bld: 217 mg/dL — ABNORMAL HIGH (ref 70–99)
Potassium: 4.1 mmol/L (ref 3.5–5.1)
Sodium: 139 mmol/L (ref 135–145)

## 2020-05-29 LAB — CBC
HCT: 39.2 % (ref 39.0–52.0)
Hemoglobin: 12.6 g/dL — ABNORMAL LOW (ref 13.0–17.0)
MCH: 28.4 pg (ref 26.0–34.0)
MCHC: 32.1 g/dL (ref 30.0–36.0)
MCV: 88.5 fL (ref 80.0–100.0)
Platelets: 259 10*3/uL (ref 150–400)
RBC: 4.43 MIL/uL (ref 4.22–5.81)
RDW: 13.5 % (ref 11.5–15.5)
WBC: 8.6 10*3/uL (ref 4.0–10.5)
nRBC: 0 % (ref 0.0–0.2)

## 2020-05-29 LAB — TROPONIN I (HIGH SENSITIVITY)
Troponin I (High Sensitivity): 9 ng/L (ref <18)
Troponin I (High Sensitivity): 11 ng/L (ref <18)

## 2020-05-29 MED ORDER — METOPROLOL SUCCINATE ER 50 MG PO TB24
50.0000 mg | ORAL_TABLET | ORAL | Status: AC
  Administered 2020-05-29: 50 mg via ORAL
  Filled 2020-05-29: qty 1

## 2020-05-29 MED ORDER — ASPIRIN 81 MG PO CHEW
81.0000 mg | CHEWABLE_TABLET | Freq: Once | ORAL | Status: AC
  Administered 2020-05-29: 81 mg via ORAL
  Filled 2020-05-29: qty 1

## 2020-05-29 NOTE — ED Notes (Signed)
Pt assisted to toilet at this time, tolerated well. Pt placed back on monitor in bed, warm blanket given at this time

## 2020-05-29 NOTE — ED Notes (Signed)
Pt verbalized understanding of d/c instructions at this time and denies further questions.   Pt ambulated to lobby at this time, NAD noted at this time

## 2020-05-29 NOTE — Consult Note (Signed)
[facility] Consultation Note  Patient ID: Edwin Olson, MRN: [id number], DOB/AGE: Mar 28, 1941 79 y.o. Admit date: 05/29/2020   Date of Consult: 05/29/2020 Primary Physician: Idelle Crouch, MD Primary Cardiologist: Nehemiah Massed  Chief Complaint:  Chief Complaint  Patient presents with  . Chest Pain   Reason for Consult: Chest discomfort  HPI: 79 y.o. male with known coronary artery disease status post PCI and stent placement in the past peripheral vascular disease hypertension hyperlipidemia paroxysmal nonvalvular atrial fibrillation and mild LV systolic dysfunction by echocardiogram.  In the past the patient has done fairly well on appropriate medication management but occasionally has some atypical chest discomfort.  He has been placed on isosorbide in the past and currently is taking isosorbide daily.  Recently in September he fell and hurt himself and had some right-sided chest discomfort.  Since then he has had periodic episodes where he has a spontaneous occurrence of left upper quadrant discomfort which slowly radiates up into his left upper chest and shoulder.  Is traveling chest discomfort does cause some shortness of breath and weakness.  It last for several hours at a time and is occasionally relieved spontaneously but sometimes just fades.  On Monday he had an episode for which she took a nitroglycerin and had some type of relief.  Then he had a recurrence of it today for which it did not relieve it.  It is still somewhat sore.  When palpating his left ribs in his axilla he does have some wincing and chest pain.  Currently the patient is comfortable though and there is no evidence of significant distress.  Currently his troponin levels are 9/11 with an EKG showing normal sinus rhythm left axis deviation anterior infarct age undetermined.  These EKG changes have not been different for quite some time.  Currently its atypical symptoms which need further evaluation at this  time  Past Medical History:  Diagnosis Date  . Arthritis   . Coronary artery disease   . Diabetes mellitus without complication ([place])   . Diverticulitis   . Diverticulitis   . Dysrhythmia    PVC's  . GERD (gastroesophageal reflux disease)   . Hypertension   . Mitral valve insufficiency       Surgical History:  Past Surgical History:  Procedure Laterality Date  . BACK SURGERY    . cardiac stents      cardiac stents   . CHOLECYSTECTOMY    . COLONOSCOPY WITH PROPOFOL N/A 05/10/2015   Procedure: COLONOSCOPY WITH PROPOFOL;  Surgeon: Hulen Luster, MD;  Location: [facility] ENDOSCOPY;  Service: Gastroenterology;  Laterality: N/A;     Home Meds: Prior to Admission medications   Medication Sig Start Date End Date Taking? Authorizing Provider  aspirin EC 81 MG tablet Take 81 mg by mouth daily.    [provider]  diazepam (VALIUM) 5 MG tablet Take 5 mg by mouth at bedtime as needed for anxiety.    [provider]  doxycycline (VIBRAMYCIN) 100 MG capsule Take 1 capsule (100 mg total) by mouth 2 (two) times daily. 04/07/18   Gregor Hams, MD  gabapentin (NEURONTIN) 600 MG tablet Take 600 mg by mouth 2 (two) times daily.     [provider]  insulin glargine (LANTUS) 100 UNIT/ML injection Inject 50 Units into the skin daily. 50 units at 2100    [provider]  insulin lispro (HUMALOG) 100 UNIT/ML injection Inject 15-22 Units into the skin 2 (two) times daily. 22 units  in the morning and 15 units at night    [provider]  lovastatin (MEVACOR) 40 MG tablet Take 40 mg by mouth at bedtime.    [provider]  metFORMIN (GLUCOPHAGE) 500 MG tablet Take by mouth 2 (two) times daily with a meal.    [provider]  metoprolol succinate (TOPROL-XL) 25 MG 24 hr tablet Take 1 tablet by mouth every evening. 09/02/15 09/01/16  [provider]  metoprolol succinate (TOPROL-XL) 50 MG 24 hr tablet Take 1 tablet by mouth every morning.  11/04/15   [provider]  nitroGLYCERIN (NITROSTAT) 0.4 MG SL tablet Place 0.4 mg under the tongue every 5 (five) minutes as needed for chest pain.    [provider]  Omeprazole-Sodium Bicarbonate (ZEGERID) 20-1100 MG CAPS capsule Take 1 capsule by mouth daily before breakfast.    [provider]  telmisartan (MICARDIS) 80 MG tablet Take 1 tablet by mouth every morning. 01/06/16 01/05/17  [provider]  zolpidem (AMBIEN) 5 MG tablet Take 5 mg by mouth at bedtime as needed for sleep.    [provider]    Inpatient Medications:     Allergies:  Allergies  Allergen Reactions  . Clonidine Swelling    Pt states turned his mouth white inside and gums and teeth ached.  . Dapagliflozin     Other reaction(s): Abdominal Pain  . Glipizide Rash and Swelling  . Atorvastatin     Other reaction(s): Other (See Comments), Unknown  . Crestor [Rosuvastatin] Itching  . Doxycycline Other (See Comments)    Other reaction(s): Unknown  . Fenofibrate     Other reaction(s): Other (See Comments) Severe indigestion  . Isosorbide   . Meclizine     Other reaction(s): Unknown  . Percocet [Oxycodone-Acetaminophen] Nausea And Vomiting  . Ultram [Tramadol] Itching  . Amlodipine Rash  . Atenolol Rash  . Clopidogrel Rash  . Lisinopril Rash  . Losartan Rash  . Metaxalone Rash  . Penicillins Rash    Has patient had a PCN reaction causing immediate rash, facial/tongue/throat swelling, SOB or lightheadedness with hypotension: Yes Has patient had a PCN reaction causing severe rash involving mucus membranes or skin necrosis: Yes Has patient had a PCN reaction that required hospitalization Yes Has patient had a PCN reaction occurring within the last 10 years: No If all of the above answers are "NO", then may proceed with Cephalosporin use.   Marland Kitchen Plavix [Clopidogrel Bisulfate] Itching and Rash    Social History   Socioeconomic History  . Marital status: Married     Spouse name: Not on file  . Number of children: Not on file  . Years of education: Not on file  . Highest education level: Not on file  Occupational History  . Not on file  Tobacco Use  . Smoking status: Never Smoker  . Smokeless tobacco: Never Used  Substance and Sexual Activity  . Alcohol use: No  . Drug use: No  . Sexual activity: Not on file  Other Topics Concern  . Not on file  Social History Narrative  . Not on file   Social Determinants of Health   Financial Resource Strain:   . Difficulty of Paying Living Expenses: Not on file  Food Insecurity:   . Worried About Charity fundraiser in the Last Year: Not on file  . Ran Out of Food in the Last Year: Not on file  Transportation Needs:   . Lack of Transportation (Medical): Not on file  .  Lack of Transportation (Non-Medical): Not on file  Physical Activity:   . Days of Exercise per Week: Not on file  . Minutes of Exercise per Session: Not on file  Stress:   . Feeling of Stress : Not on file  Social Connections:   . Frequency of Communication with Friends and Family: Not on file  . Frequency of Social Gatherings with Friends and Family: Not on file  . Attends Religious Services: Not on file  . Active Member of Clubs or Organizations: Not on file  . Attends Archivist Meetings: Not on file  . Marital Status: Not on file  Intimate Partner Violence:   . Fear of Current or Ex-Partner: Not on file  . Emotionally Abused: Not on file  . Physically Abused: Not on file  . Sexually Abused: Not on file     No family history on file.   Review of Systems Positive for chest and abdominal discomfort Negative for: General:  chills, fever, night sweats or weight changes.  Cardiovascular: PND orthopnea syncope dizziness  Dermatological skin lesions rashes Respiratory: Cough congestion Urologic: Frequent urination urination at night and hematuria Abdominal: negative for nausea, vomiting, diarrhea, bright red blood  per rectum, melena, or hematemesis Neurologic: negative for visual changes, and/or hearing changes  All other systems reviewed and are otherwise negative except as noted above.  Labs: No results for input(s): CKTOTAL, CKMB, TROPONINI in the last 72 hours. Lab Results  Component Value Date   WBC 8.6 05/29/2020   HGB 12.6 (L) 05/29/2020   HCT 39.2 05/29/2020   MCV 88.5 05/29/2020   PLT 259 05/29/2020    Recent Labs  Lab 05/29/20 1320  NA 139  K 4.1  CL 104  CO2 25  BUN 13  CREATININE 1.36*  CALCIUM 9.3  GLUCOSE 217*   No results found for: CHOL, HDL, LDLCALC, TRIG No results found for: DDIMER  Radiology/Studies:  DG Chest 2 View  Result Date: 05/29/2020 CLINICAL DATA:  Chest pain. EXAM: CHEST - 2 VIEW COMPARISON:  May 10, 2020. FINDINGS: The heart size and mediastinal contours are within normal limits. Both lungs are clear. No visible pleural effusions or pneumothorax. No acute osseous abnormality. IMPRESSION: No active cardiopulmonary disease. Electronically Signed   By: Margaretha Sheffield MD   On: 05/29/2020 13:48   DG Chest 2 View  Result Date: 05/11/2020 CLINICAL DATA:  Upper abdominal pain, left arm pain EXAM: CHEST - 2 VIEW COMPARISON:  None. FINDINGS: Lungs are well expanded, symmetric, and clear. No pneumothorax or pleural effusion. Cardiac size within normal limits. Pulmonary vascularity is normal. Osseous structures are age-appropriate. No acute bone abnormality. IMPRESSION: No active cardiopulmonary disease. Electronically Signed   By: Fidela Salisbury MD   On: 05/11/2020 00:36    EKG: Normal sinus rhythm with left axis deviation anterior infarct age undetermined unchanged from before  Weights: Filed Weights   05/29/20 1318  Weight: 85.7 kg     Physical Exam: Blood pressure (!) 180/89, pulse 70, temperature 98.7 F (37.1 C), temperature source Oral, resp. rate 14, height 5\' 11"  (1.803 m), weight 85.7 kg, SpO2 97 %. Body mass index is 26.36  kg/m. General: Well developed, well nourished, in no acute distress. Head eyes ears nose throat: Normocephalic, atraumatic, sclera non-icteric, no xanthomas, nares are without discharge. No apparent thyromegaly and/or mass  Lungs: Normal respiratory effort.  no wheezes, no rales, no rhonchi.  Heart: RRR with normal S1 S2. no murmur gallop, no rub, PMI is normal  size and placement, carotid upstroke normal without bruit, jugular venous pressure is normal Abdomen: Soft, non-tender, non-distended with normoactive bowel sounds. No hepatomegaly. No rebound/guarding. No obvious abdominal masses. Abdominal aorta is normal size without bruit Extremities: No edema. no cyanosis, no clubbing, no ulcers  Peripheral : 2+ bilateral upper extremity pulses, 2+ bilateral femoral pulses, 2+ bilateral dorsal pedal pulse Neuro: Alert and oriented. No facial asymmetry. No focal deficit. Moves all extremities spontaneously. Musculoskeletal: Normal muscle tone without kyphosis Psych:  Responds to questions appropriately with a normal affect.    Assessment: 79 year old male with known coronary disease status post previous PCI and stent placement hypertension hyperlipidemia peripheral vascular disease paroxysmal nonvalvular atrial fibrillation with abnormal EKG and atypical chest discomfort without evidence of congestive heart failure or myocardial infarction or acute coronary syndrome at this time.  Plan: 1.  Continue observation for further evaluation with telemetry and EKG for the next hour 2.  Reassessment of troponin and if no elevation of troponin.  Would consider discharged home with further outpatient follow-up including the possibility of treadmill stress test echocardiogram 3.  Continuation of isosorbide each day and nitroglycerin as necessary for other episodes of chest discomfort as needed 4.  Continue other medical management for risk factor modification  Signed, Corey Skains M.D. [facility] 05/29/2020, 5:47 PM

## 2020-05-29 NOTE — ED Triage Notes (Signed)
Pt c/o left sided chest pain that radiating into the left shoulder and arm for the past 2 weeks, today he states while driving he almost blacked out. States on Monday he took a nitro pill and it did relief the pain some temporarily

## 2020-05-29 NOTE — ED Provider Notes (Signed)
[facility] Emergency Department Provider Note   ____________________________________________   First MD Initiated Contact with Patient 05/29/20 1629     (approximate)  I have reviewed the triage vital signs and the nursing notes.   HISTORY  Chief Complaint Chest Pain    HPI Edwin Olson is a 79 y.o. male the history of coronary disease and diabetes  Patient presents for left upper abdominal pain that radiates to his chest.  Patient reports he had many episodes of this, several over the last couple months.  To the ER previous for same and seen his cardiologist.  I scheduled him for a stress test but cannot be done until about the second week of January.  He continues to have episodes where he will start to develop a sharp discomfort in his lower very left upper abdomen after eating, and then it will radiate to his left shoulder and left arm.  It sharp not associated any shortness of breath.  The symptoms will go away frequently on their own within minutes.  Today had this happen while he was driving his car when he felt lightheaded during the episode.  Symptoms have since abated but he did have to take a nitroglycerin tablet  Takes aspirin and blood pressure medicine daily  He reports during these episodes his blood pressure will go very high and then will come back down slowly as the pain goes away  He has been evaluated and reports has had multiple  evaluations for same.  Today symptoms were same but they seem to be occurring more frequently now occurring up to once a day over the last week  He relates the symptoms all started several days after he had a fall back in around and beginning of September  Past Medical History:  Diagnosis Date  . Arthritis   . Coronary artery disease   . Diabetes mellitus without complication ([place])   . Diverticulitis   . Diverticulitis   . Dysrhythmia    PVC's  . GERD (gastroesophageal reflux disease)   .  Hypertension   . Mitral valve insufficiency     Patient Active Problem List   Diagnosis Date Noted  . Unstable angina ([place]) 02/26/2016    Past Surgical History:  Procedure Laterality Date  . BACK SURGERY    . cardiac stents      cardiac stents   . CHOLECYSTECTOMY    . COLONOSCOPY WITH PROPOFOL N/A 05/10/2015   Procedure: COLONOSCOPY WITH PROPOFOL;  Surgeon: Hulen Luster, MD;  Location: [facility] ENDOSCOPY;  Service: Gastroenterology;  Laterality: N/A;    Prior to Admission medications   Medication Sig Start Date End Date Taking? Authorizing Provider  aspirin EC 81 MG tablet Take 81 mg by mouth daily.    [provider]  diazepam (VALIUM) 5 MG tablet Take 5 mg by mouth at bedtime as needed for anxiety.    [provider]  doxycycline (VIBRAMYCIN) 100 MG capsule Take 1 capsule (100 mg total) by mouth 2 (two) times daily. 04/07/18   Gregor Hams, MD  gabapentin (NEURONTIN) 600 MG tablet Take 600 mg by mouth 2 (two) times daily.     [provider]  insulin glargine (LANTUS) 100 UNIT/ML injection Inject 50 Units into the skin daily. 50 units at 2100    [provider]  insulin lispro (HUMALOG) 100 UNIT/ML injection Inject 15-22 Units into the skin 2 (two) times daily. 22 units in the morning and 15 units at night  [provider]  lovastatin (MEVACOR) 40 MG tablet Take 40 mg by mouth at bedtime.    [provider]  metFORMIN (GLUCOPHAGE) 500 MG tablet Take by mouth 2 (two) times daily with a meal.    [provider]  metoprolol succinate (TOPROL-XL) 25 MG 24 hr tablet Take 1 tablet by mouth every evening. 09/02/15 09/01/16  [provider]  metoprolol succinate (TOPROL-XL) 50 MG 24 hr tablet Take 1 tablet by mouth every morning. 11/04/15   [provider]  nitroGLYCERIN (NITROSTAT) 0.4 MG SL tablet Place 0.4 mg under the tongue every 5 (five) minutes as needed for chest pain.    [provider]    Omeprazole-Sodium Bicarbonate (ZEGERID) 20-1100 MG CAPS capsule Take 1 capsule by mouth daily before breakfast.    [provider]  telmisartan (MICARDIS) 80 MG tablet Take 1 tablet by mouth every morning. 01/06/16 01/05/17  [provider]  zolpidem (AMBIEN) 5 MG tablet Take 5 mg by mouth at bedtime as needed for sleep.    [provider]    Allergies Clonidine, Dapagliflozin, Glipizide, Atorvastatin, Crestor [rosuvastatin], Doxycycline, Fenofibrate, Isosorbide, Meclizine, Percocet [oxycodone-acetaminophen], Ultram [tramadol], Amlodipine, Atenolol, Clopidogrel, Lisinopril, Losartan, Metaxalone, Penicillins, and Plavix [clopidogrel bisulfate]  No family history on file.  Social History Social History   Tobacco Use  . Smoking status: Never Smoker  . Smokeless tobacco: Never Used  Substance Use Topics  . Alcohol use: No  . Drug use: No    Review of Systems Constitutional: No fever/chills Eyes: No visual changes. ENT: No sore throat. Cardiovascular: See HPI.  Pain and symptom-free now Respiratory: Denies shortness of breath. Gastrointestinal: No abdominal pain now, but when symptoms started to start in his left upper abdomen shooting sharp pain towards his left shoulder and arm.   Genitourinary: Negative for dysuria. Musculoskeletal: Negative for back pain. Skin: Negative for rash. Neurological: Negative for headaches, areas of focal weakness or numbness.    ____________________________________________   PHYSICAL EXAM:  VITAL SIGNS: ED Triage Vitals  Enc Vitals Group     BP 05/29/20 1313 (!) 156/90     Pulse Rate 05/29/20 1313 77     Resp 05/29/20 1313 18     Temp 05/29/20 1313 98.7 F (37.1 C)     Temp Source 05/29/20 1313 Oral     SpO2 05/29/20 1313 97 %     Weight 05/29/20 1318 189 lb (85.7 kg)     Height 05/29/20 1318 5\' 11"  (1.803 m)     Head Circumference --      Peak Flow --      Pain Score 05/29/20 1317 8     Pain Loc --       Pain Edu? --      Excl. in [place]? --     Constitutional: Alert and oriented. Well appearing and in no acute distress. Eyes: Conjunctivae are normal. Head: Atraumatic. Nose: No congestion/rhinnorhea. Mouth/Throat: Mucous membranes are moist. Neck: No stridor.  Cardiovascular: Normal rate, regular rhythm. Grossly normal heart sounds.  Good peripheral circulation. Respiratory: Normal respiratory effort.  No retractions. Lungs CTAB. Gastrointestinal: Soft and nontender. No distention. Musculoskeletal: No lower extremity tenderness nor edema. Neurologic:  Normal speech and language. No gross focal neurologic deficits are appreciated.  Skin:  Skin is warm, dry and intact. No rash noted. Psychiatric: Mood and affect are normal. Speech and behavior are normal.  ____________________________________________   LABS (all labs ordered are listed, but only abnormal results are displayed)  Labs Reviewed  BASIC METABOLIC PANEL - Abnormal; Notable for the following components:      Result Value   Glucose, Bld 217 (*)    Creatinine, Ser 1.36 (*)    GFR, Estimated 54 (*)    All other components within normal limits  CBC - Abnormal; Notable for the following components:   Hemoglobin 12.6 (*)    All other components within normal limits  TROPONIN I (HIGH SENSITIVITY)  TROPONIN I (HIGH SENSITIVITY)   ____________________________________________  EKG  Reviewed inter by me at 1350 Heart rate 89 QRS 110 QTc 440 Normal sinus rhythm, left axis.  No evidence of acute ischemia.  Compared with previous EKG in our system this appears to be unchanged with regard to any significant differences ____________________________________________  RADIOLOGY  No acute findings on chest x-ray ____________________________________________   PROCEDURES  Procedure(s) performed: None  Procedures  Critical Care performed: No  ____________________________________________   INITIAL IMPRESSION / ASSESSMENT  AND PLAN / ED COURSE  Pertinent labs & imaging results that were available during my care of the patient were reviewed by me and considered in my medical decision making (see chart for details).   Differential diagnosis includes, but is not limited to, ACS, aortic dissection, pulmonary embolism, cardiac tamponade, pneumothorax, pneumonia, pericarditis, myocarditis, GI-related causes including esophagitis/gastritis, and musculoskeletal chest wall pain.  She reports the symptoms seem to start in his left upper abdomen within a couple hours of eating and lancinating pain towards the shoulder.  Symptoms have abated.  His symptoms seem very atypical of ACS.  Reports it is a sharp stinging pain lancinating and then goes away.  Did however go away after taking nitroglycerin today and he has a history of coronary risk factors but his EKG is reassuring and his troponins are normal  He is somewhat hypertensive, but reports at home his blood pressure as high as 244+ systolic when his episodes occur.  Also Dr. Alveria Apley previous note indicates similar episodes occurring  Discussed case and care with Dr. Nehemiah Massed   Clinical Course as of May 29 1802  Wed May 29, 2020  1725 Dr. Nehemiah Massed of cardiology currently in seeing patient    [MQ]  1734 Dr. Nehemiah Massed advises discharge to follow-up tomorrow with Dr. Doy Hutching if repeat troponin reassuring.    [MQ]    Clinical Course User Index [MQ] Delman Kitten, MD    HEAR Score: 4   Return precautions and treatment recommendations and follow-up discussed with the patient who is agreeable with the plan.  ____________________________________________   FINAL CLINICAL IMPRESSION(S) / ED DIAGNOSES  Final diagnoses:  Atypical chest pain        Note:  This document was prepared using Dragon voice recognition software and may include unintentional dictation errors       Delman Kitten, MD 05/29/20 1803

## 2020-05-29 NOTE — ED Notes (Signed)
Cardiologist at bedside at this time.

## 2020-07-05 ENCOUNTER — Other Ambulatory Visit: Admission: RE | Admit: 2020-07-05 | Payer: Medicare Other | Source: Ambulatory Visit

## 2020-07-26 ENCOUNTER — Other Ambulatory Visit: Payer: Self-pay

## 2020-07-26 ENCOUNTER — Other Ambulatory Visit
Admission: RE | Admit: 2020-07-26 | Discharge: 2020-07-26 | Disposition: A | Discharge status: Home / Self Care | Payer: Medicare Other | Source: Ambulatory Visit | Attending: Gastroenterology | Admitting: Gastroenterology

## 2020-07-26 DIAGNOSIS — Z20822 Contact with and (suspected) exposure to covid-19: Secondary | ICD-10-CM | POA: Insufficient documentation

## 2020-07-26 DIAGNOSIS — Z01812 Encounter for preprocedural laboratory examination: Secondary | ICD-10-CM | POA: Insufficient documentation

## 2020-07-26 LAB — SARS CORONAVIRUS 2 (TAT 6-24 HRS): SARS Coronavirus 2: NEGATIVE

## 2020-07-29 ENCOUNTER — Encounter: Payer: Self-pay | Admitting: *Deleted

## 2020-07-30 ENCOUNTER — Other Ambulatory Visit: Payer: Self-pay

## 2020-07-30 ENCOUNTER — Ambulatory Visit: Payer: Medicare Other | Admitting: Anesthesiology

## 2020-07-30 ENCOUNTER — Ambulatory Visit
Admission: RE | Admit: 2020-07-30 | Discharge: 2020-07-30 | Disposition: A | Discharge status: Home / Self Care | Payer: Medicare Other | Attending: Gastroenterology | Admitting: Gastroenterology

## 2020-07-30 ENCOUNTER — Encounter
Admission: RE | Disposition: A | Discharge status: Home / Self Care | Payer: Self-pay | Source: Home / Self Care | Attending: Gastroenterology

## 2020-07-30 DIAGNOSIS — Z86010 Personal history of colonic polyps: Secondary | ICD-10-CM | POA: Diagnosis not present

## 2020-07-30 DIAGNOSIS — Z79899 Other long term (current) drug therapy: Secondary | ICD-10-CM | POA: Insufficient documentation

## 2020-07-30 DIAGNOSIS — D124 Benign neoplasm of descending colon: Secondary | ICD-10-CM | POA: Insufficient documentation

## 2020-07-30 DIAGNOSIS — E119 Type 2 diabetes mellitus without complications: Secondary | ICD-10-CM | POA: Insufficient documentation

## 2020-07-30 DIAGNOSIS — D125 Benign neoplasm of sigmoid colon: Secondary | ICD-10-CM | POA: Insufficient documentation

## 2020-07-30 DIAGNOSIS — Z888 Allergy status to other drugs, medicaments and biological substances status: Secondary | ICD-10-CM | POA: Insufficient documentation

## 2020-07-30 DIAGNOSIS — Z794 Long term (current) use of insulin: Secondary | ICD-10-CM | POA: Insufficient documentation

## 2020-07-30 DIAGNOSIS — Z885 Allergy status to narcotic agent status: Secondary | ICD-10-CM | POA: Insufficient documentation

## 2020-07-30 DIAGNOSIS — I2510 Atherosclerotic heart disease of native coronary artery without angina pectoris: Secondary | ICD-10-CM | POA: Diagnosis not present

## 2020-07-30 DIAGNOSIS — K640 First degree hemorrhoids: Secondary | ICD-10-CM | POA: Diagnosis not present

## 2020-07-30 DIAGNOSIS — Z1211 Encounter for screening for malignant neoplasm of colon: Secondary | ICD-10-CM | POA: Insufficient documentation

## 2020-07-30 DIAGNOSIS — Z7984 Long term (current) use of oral hypoglycemic drugs: Secondary | ICD-10-CM | POA: Insufficient documentation

## 2020-07-30 DIAGNOSIS — D122 Benign neoplasm of ascending colon: Secondary | ICD-10-CM | POA: Diagnosis not present

## 2020-07-30 DIAGNOSIS — Z7982 Long term (current) use of aspirin: Secondary | ICD-10-CM | POA: Insufficient documentation

## 2020-07-30 DIAGNOSIS — Z880 Allergy status to penicillin: Secondary | ICD-10-CM | POA: Diagnosis not present

## 2020-07-30 DIAGNOSIS — K5730 Diverticulosis of large intestine without perforation or abscess without bleeding: Secondary | ICD-10-CM | POA: Insufficient documentation

## 2020-07-30 DIAGNOSIS — Z9049 Acquired absence of other specified parts of digestive tract: Secondary | ICD-10-CM | POA: Insufficient documentation

## 2020-07-30 HISTORY — PX: COLONOSCOPY WITH PROPOFOL: SHX5780

## 2020-07-30 LAB — GLUCOSE, CAPILLARY: Glucose-Capillary: 133 mg/dL — ABNORMAL HIGH (ref 70–99)

## 2020-07-30 SURGERY — COLONOSCOPY WITH PROPOFOL
Anesthesia: General

## 2020-07-30 MED ORDER — LIDOCAINE HCL (PF) 2 % IJ SOLN
INTRAMUSCULAR | Status: AC
  Filled 2020-07-30: qty 5

## 2020-07-30 MED ORDER — PROPOFOL 10 MG/ML IV BOLUS
INTRAVENOUS | Status: DC | PRN
  Administered 2020-07-30: 30 mg via INTRAVENOUS
  Administered 2020-07-30: 60 mg via INTRAVENOUS

## 2020-07-30 MED ORDER — SODIUM CHLORIDE 0.9 % IV SOLN
INTRAVENOUS | Status: DC
  Administered 2020-07-30: 20 mL/h via INTRAVENOUS

## 2020-07-30 MED ORDER — PROPOFOL 500 MG/50ML IV EMUL
INTRAVENOUS | Status: DC | PRN
  Administered 2020-07-30: 100 ug/kg/min via INTRAVENOUS

## 2020-07-30 MED ORDER — PROPOFOL 10 MG/ML IV BOLUS
INTRAVENOUS | Status: AC
  Filled 2020-07-30: qty 120

## 2020-07-30 NOTE — Transfer of Care (Signed)
Immediate Anesthesia Transfer of Care Note  Patient: Montrey Buist [facility]  Procedure(s) Performed: COLONOSCOPY WITH PROPOFOL (N/A )  Patient Location: PACU and Endoscopy Unit  Anesthesia Type:General  Level of Consciousness: awake, drowsy and patient cooperative  Airway & Oxygen Therapy: Patient Spontanous Breathing  Post-op Assessment: Report given to RN and Post -op Vital signs reviewed and stable  Post vital signs: Reviewed and stable  Last Vitals:  Vitals Value Taken Time  BP 115/71 07/30/20 0828  Temp 36.1 C 07/30/20 0828  Pulse 76 07/30/20 0828  Resp 18 07/30/20 0828  SpO2 97 % 07/30/20 0828    Last Pain:  Vitals:   07/30/20 0828  TempSrc: Temporal  PainSc:          Complications: No complications documented.

## 2020-07-30 NOTE — Anesthesia Preprocedure Evaluation (Signed)
Anesthesia Evaluation  Patient identified by MRN, date of birth, ID band Patient awake    Reviewed: Allergy & Precautions, H&P , NPO status , Patient's Chart, lab work & pertinent test results  History of Anesthesia Complications Negative for: history of anesthetic complications  Airway Mallampati: III  TM Distance: <3 FB Neck ROM: limited    Dental  (+) Chipped, Missing   Pulmonary neg pulmonary ROS, neg shortness of breath,    Pulmonary exam normal        Cardiovascular Exercise Tolerance: Good hypertension, (-) angina+ CAD and + Cardiac Stents  (-) DOE Normal cardiovascular exam+ dysrhythmias      Neuro/Psych negative neurological ROS  negative psych ROS   GI/Hepatic Neg liver ROS, GERD  Medicated and Controlled,  Endo/Other  diabetes, Type 2  Renal/GU negative Renal ROS  negative genitourinary   Musculoskeletal  (+) Arthritis ,   Abdominal   Peds  Hematology negative hematology ROS (+)   Anesthesia Other Findings Past Medical History: No date: Arthritis No date: Coronary artery disease No date: Diabetes mellitus without complication (HCC) No date: Diverticulitis No date: Diverticulitis No date: Dysrhythmia     Comment:  PVC's No date: GERD (gastroesophageal reflux disease) No date: Hypertension No date: Mitral valve insufficiency  Past Surgical History: No date: BACK SURGERY No date: CARDIAC CATHETERIZATION No date: cardiac stents      Comment:  cardiac stents  No date: CHOLECYSTECTOMY 05/10/2015: COLONOSCOPY WITH PROPOFOL; N/A     Comment:  Procedure: COLONOSCOPY WITH PROPOFOL;  Surgeon: Hulen Luster, MD;  Location: [facility] ENDOSCOPY;  Service:               Gastroenterology;  Laterality: N/A;  BMI    Body Mass Index: 25.54 kg/m      Reproductive/Obstetrics negative OB ROS                             Anesthesia Physical Anesthesia Plan  ASA:  III  Anesthesia Plan: General   Post-op Pain Management:    Induction: Intravenous  PONV Risk Score and Plan: Propofol infusion and TIVA  Airway Management Planned: Natural Airway and Nasal Cannula  Additional Equipment:   Intra-op Plan:   Post-operative Plan:   Informed Consent: I have reviewed the patients History and Physical, chart, labs and discussed the procedure including the risks, benefits and alternatives for the proposed anesthesia with the patient or authorized representative who has indicated his/her understanding and acceptance.     Dental Advisory Given  Plan Discussed with: Anesthesiologist, CRNA and Surgeon  Anesthesia Plan Comments: (Patient consented for risks of anesthesia including but not limited to:  - adverse reactions to medications - risk of airway placement if required - damage to eyes, teeth, lips or other oral mucosa - nerve damage due to positioning  - sore throat or hoarseness - Damage to heart, brain, nerves, lungs, other parts of body or loss of life  Patient voiced understanding.)        Anesthesia Quick Evaluation

## 2020-07-30 NOTE — Interval H&P Note (Signed)
History and Physical Interval Note:  07/30/2020 8:01 AM  Edwin Olson  has presented today for surgery, with the diagnosis of PH COLON POLYPS.  The various methods of treatment have been discussed with the patient and family. After consideration of risks, benefits and other options for treatment, the patient has consented to  Procedure(s): COLONOSCOPY WITH PROPOFOL (N/A) as a surgical intervention.  The patient's history has been reviewed, patient examined, no change in status, stable for surgery.  I have reviewed the patient's chart and labs.  Questions were answered to the patient's satisfaction.     [name]  Ok to proceed with colonoscopy

## 2020-07-30 NOTE — Addendum Note (Signed)
Addendum  created 07/30/20 0910 by Lowry Bowl, CRNA   Charge Capture section accepted

## 2020-07-30 NOTE — H&P (Signed)
Outpatient short stay form Pre-procedure 07/30/2020 7:59 AM Edwin Miyamoto MD, MPH  Primary Physician: Dr. Doy Hutching  Reason for visit:  Surveillance  History of present illness:   80 y/o gentleman with history of CAD, DM II, and adenomatous polyps here for surveillance colonoscopy. No family history of GI malignancies. No blood thinners. History of cholecystectomy. No new GI symptoms besides mild rectal bleeding due to prep and chronic lower abdominal pain with negative CT scan.    Current Facility-Administered Medications:  .  0.9 %  sodium chloride infusion, , Intravenous, Continuous, Zylee Marchiano, Hilton Cork, MD, Last Rate: 20 mL/hr at 07/30/20 0714, 20 mL/hr at 07/30/20 4097  Medications Prior to Admission  Medication Sig Dispense Refill Last Dose  . aspirin EC 81 MG tablet Take 81 mg by mouth daily.   Past Week at Unknown time  . diazepam (VALIUM) 5 MG tablet Take 5 mg by mouth at bedtime as needed for anxiety.   Past Week at Unknown time  . doxycycline (VIBRAMYCIN) 100 MG capsule Take 1 capsule (100 mg total) by mouth 2 (two) times daily. 20 capsule 0 Past Week at Unknown time  . gabapentin (NEURONTIN) 600 MG tablet Take 600 mg by mouth 2 (two) times daily.    07/29/2020 at Unknown time  . insulin glargine (LANTUS) 100 UNIT/ML injection Inject 50 Units into the skin daily. 50 units at 2100   07/29/2020 at Unknown time  . insulin lispro (HUMALOG) 100 UNIT/ML injection Inject 15-22 Units into the skin 2 (two) times daily. 22 units in the morning and 15 units at night   07/29/2020 at Unknown time  . lovastatin (MEVACOR) 40 MG tablet Take 40 mg by mouth at bedtime.   07/29/2020 at Unknown time  . metFORMIN (GLUCOPHAGE) 500 MG tablet Take by mouth 2 (two) times daily with a meal.   Past Week at Unknown time  . metoprolol succinate (TOPROL-XL) 50 MG 24 hr tablet Take 1 tablet by mouth every morning.   07/30/2020 at 0600  . nitroGLYCERIN (NITROSTAT) 0.4 MG SL tablet Place 0.4 mg under the tongue every 5  (five) minutes as needed for chest pain.   Past Month at Unknown time  . Omeprazole-Sodium Bicarbonate (ZEGERID) 20-1100 MG CAPS capsule Take 1 capsule by mouth daily before breakfast.   Past Week at Unknown time  . zolpidem (AMBIEN) 5 MG tablet Take by mouth at bedtime as needed for sleep.   07/30/2020 at 0600  . metoprolol succinate (TOPROL-XL) 25 MG 24 hr tablet Take 1 tablet by mouth every evening.     Marland Kitchen telmisartan (MICARDIS) 80 MG tablet Take 1 tablet by mouth every morning.        Allergies  Allergen Reactions  . Clonidine Swelling    Pt states turned his mouth white inside and gums and teeth ached.  . Dapagliflozin     Other reaction(s): Abdominal Pain  . Glipizide Rash and Swelling  . Atorvastatin     Other reaction(s): Other (See Comments), Unknown  . Crestor [Rosuvastatin] Itching  . Doxycycline Other (See Comments)    Other reaction(s): Unknown  . Fenofibrate     Other reaction(s): Other (See Comments) Severe indigestion  . Isosorbide   . Meclizine     Other reaction(s): Unknown  . Percocet [Oxycodone-Acetaminophen] Nausea And Vomiting  . Ultram [Tramadol] Itching  . Amlodipine Rash  . Atenolol Rash  . Clopidogrel Rash  . Lisinopril Rash  . Losartan Rash  . Metaxalone Rash  . Penicillins Rash  Has patient had a PCN reaction causing immediate rash, facial/tongue/throat swelling, SOB or lightheadedness with hypotension: Yes Has patient had a PCN reaction causing severe rash involving mucus membranes or skin necrosis: Yes Has patient had a PCN reaction that required hospitalization Yes Has patient had a PCN reaction occurring within the last 10 years: No If all of the above answers are "NO", then may proceed with Cephalosporin use.   Marland Kitchen Plavix [Clopidogrel Bisulfate] Itching and Rash     Past Medical History:  Diagnosis Date  . Arthritis   . Coronary artery disease   . Diabetes mellitus without complication ([place])   . Diverticulitis   . Diverticulitis   .  Dysrhythmia    PVC's  . GERD (gastroesophageal reflux disease)   . Hypertension   . Mitral valve insufficiency     Review of systems:  Otherwise negative.    Physical Exam  Gen: Appears stated age.  HEENT: PERRLA. Lungs: No respiratory distress CV: RRR Abd: soft, benign, no masses Ext: No edema    Planned procedures: Proceed with colonoscopy. The patient understands the nature of the planned procedure, indications, risks, alternatives and potential complications including but not limited to bleeding, infection, perforation, damage to internal organs and possible oversedation/side effects from anesthesia. The patient agrees and gives consent to proceed.  Please refer to procedure notes for findings, recommendations and patient disposition/instructions.     Edwin Miyamoto MD, MPH Gastroenterology 07/30/2020  7:59 AM

## 2020-07-30 NOTE — Anesthesia Postprocedure Evaluation (Signed)
Anesthesia Post Note  Patient: Edwin Olson [facility]  Procedure(s) Performed: COLONOSCOPY WITH PROPOFOL (N/A )  Patient location during evaluation: Endoscopy Anesthesia Type: General Level of consciousness: awake and alert Pain management: pain level controlled Vital Signs Assessment: post-procedure vital signs reviewed and stable Respiratory status: spontaneous breathing, nonlabored ventilation, respiratory function stable and patient connected to nasal cannula oxygen Cardiovascular status: blood pressure returned to baseline and stable Postop Assessment: no apparent nausea or vomiting Anesthetic complications: no   No complications documented.   Last Vitals:  Vitals:   07/30/20 0848 07/30/20 0858  BP: 128/75 (!) 150/80  Pulse: 66 63  Resp: 15 16  Temp:    SpO2: 100% 100%    Last Pain:  Vitals:   07/30/20 0858  TempSrc:   PainSc: 0-No pain                 Edwin Olson

## 2020-07-30 NOTE — Op Note (Signed)
[facility] Gastroenterology Patient Name: Edwin Olson Procedure Date: 07/30/2020 8:01 AM MRN: [id number] Account #: [id number] Date of Birth: Jul 20, 1940 Admit Type: Outpatient Age: 80 Room: [facility] ENDO ROOM 1 Gender: Male Note Status: Finalized Procedure:             Colonoscopy Indications:           Surveillance: Personal history of adenomatous polyps                         on last colonoscopy > 5 years ago Providers:             Andrey Farmer MD, MD Referring MD:          Leonie Douglas. Doy Hutching, MD (Referring MD) Medicines:             Monitored Anesthesia Care Complications:         No immediate complications. Estimated blood loss:                         Minimal. Procedure:             Pre-Anesthesia Assessment:                        - Prior to the procedure, a History and Physical was                         performed, and patient medications and allergies were                         reviewed. The patient is competent. The risks and                         benefits of the procedure and the sedation options and                         risks were discussed with the patient. All questions                         were answered and informed consent was obtained.                         Patient identification and proposed procedure were                         verified by the physician, the nurse, the anesthetist                         and the technician in the endoscopy suite. Mental                         Status Examination: alert and oriented. Airway                         Examination: normal oropharyngeal airway and neck                         mobility. Respiratory Examination: clear to  auscultation. CV Examination: normal. Prophylactic                         Antibiotics: The patient does not require prophylactic                         antibiotics. Prior Anticoagulants: The patient has                         taken no previous  anticoagulant or antiplatelet agents                         except for aspirin. ASA Grade Assessment: III - A                         patient with severe systemic disease. After reviewing                         the risks and benefits, the patient was deemed in                         satisfactory condition to undergo the procedure. The                         anesthesia plan was to use monitored anesthesia care                         (MAC). Immediately prior to administration of                         medications, the patient was re-assessed for adequacy                         to receive sedatives. The heart rate, respiratory                         rate, oxygen saturations, blood pressure, adequacy of                         pulmonary ventilation, and response to care were                         monitored throughout the procedure. The physical                         status of the patient was re-assessed after the                         procedure.                        After obtaining informed consent, the colonoscope was                         passed under direct vision. Throughout the procedure,                         the patient's blood pressure, pulse, and oxygen  saturations were monitored continuously. The                         Colonoscope was introduced through the anus and                         advanced to the the cecum, identified by appendiceal                         orifice and ileocecal valve. The colonoscopy was                         performed without difficulty. The patient tolerated                         the procedure well. The quality of the bowel                         preparation was good except the sigmoid colon was fair. Findings:      The perianal and digital rectal examinations were normal.      Many small and large-mouthed diverticula were found in the sigmoid colon       and descending colon.      Semi-solid stool was  found in the sigmoid colon, making visualization       difficult.      A few small-mouthed diverticula were found in the ascending colon.      A 2 mm polyp was found in the ascending colon. The polyp was sessile.       The polyp was removed with a jumbo cold forceps. Resection and retrieval       were complete. Estimated blood loss was minimal.      A 2 mm polyp was found in the descending colon. The polyp was sessile.       The polyp was removed with a jumbo cold forceps. Resection and retrieval       were complete. Estimated blood loss was minimal.      A 3 mm polyp was found in the sigmoid colon. The polyp was sessile. The       polyp was removed with a cold snare. Resection and retrieval were       complete. Estimated blood loss was minimal.      Internal hemorrhoids were found during retroflexion. The hemorrhoids       were Grade I (internal hemorrhoids that do not prolapse).      The exam was otherwise without abnormality on direct and retroflexion       views. Impression:            - Diverticulosis in the sigmoid colon and in the                         descending colon.                        - Stool in the sigmoid colon.                        - Diverticulosis in the ascending colon.                        - One  2 mm polyp in the ascending colon, removed with                         a jumbo cold forceps. Resected and retrieved.                        - One 2 mm polyp in the descending colon, removed with                         a jumbo cold forceps. Resected and retrieved.                        - One 3 mm polyp in the sigmoid colon, removed with a                         cold snare. Resected and retrieved.                        - Internal hemorrhoids.                        - The examination was otherwise normal on direct and                         retroflexion views. Recommendation:        - Discharge patient to home.                        - Resume previous diet.                         - Continue present medications.                        - Await pathology results.                        - Repeat colonoscopy in 12 months because the bowel                         preparation was suboptimal. Only repeat if benefits                         outweigh risks.                        - Return to referring physician as previously                         scheduled. Procedure Code(s):     --- Professional ---                        [phone], Colonoscopy, flexible; with removal of                         tumor(s), polyp(s), or other lesion(s) by snare                         technique  09643, 29, Colonoscopy, flexible; with biopsy, single                         or multiple Diagnosis Code(s):     --- Professional ---                        Z86.010, Personal history of colonic polyps                        K64.0, First degree hemorrhoids                        K63.5, Polyp of colon                        K57.30, Diverticulosis of large intestine without                         perforation or abscess without bleeding CPT copyright 2019 [employer]. All rights reserved. The codes documented in this report are preliminary and upon coder review may  be revised to meet current compliance requirements. Andrey Farmer MD, MD 07/30/2020 8:37:49 AM Number of Addenda: 0 Note Initiated On: 07/30/2020 8:01 AM Scope Withdrawal Time: 0 hours 8 minutes 10 seconds  Total Procedure Duration: 0 hours 16 minutes 5 seconds  Estimated Blood Loss:  Estimated blood loss was minimal.      [facility]

## 2020-07-31 ENCOUNTER — Encounter: Payer: Self-pay | Admitting: Gastroenterology

## 2020-07-31 LAB — SURGICAL PATHOLOGY

## 2021-04-17 ENCOUNTER — Ambulatory Visit
Payer: Medicare Other | Attending: Student in an Organized Health Care Education/Training Program | Admitting: Student in an Organized Health Care Education/Training Program

## 2021-04-17 ENCOUNTER — Encounter: Payer: Self-pay | Admitting: Student in an Organized Health Care Education/Training Program

## 2021-04-17 ENCOUNTER — Other Ambulatory Visit: Payer: Self-pay

## 2021-04-17 VITALS — BP 144/79 | HR 72 | Temp 96.4°F | Resp 16 | Ht 71.0 in | Wt 189.0 lb

## 2021-04-17 DIAGNOSIS — G894 Chronic pain syndrome: Secondary | ICD-10-CM | POA: Diagnosis present

## 2021-04-17 DIAGNOSIS — G8929 Other chronic pain: Secondary | ICD-10-CM | POA: Diagnosis present

## 2021-04-17 DIAGNOSIS — M961 Postlaminectomy syndrome, not elsewhere classified: Secondary | ICD-10-CM | POA: Diagnosis not present

## 2021-04-17 DIAGNOSIS — M47816 Spondylosis without myelopathy or radiculopathy, lumbar region: Secondary | ICD-10-CM | POA: Diagnosis present

## 2021-04-17 DIAGNOSIS — M48062 Spinal stenosis, lumbar region with neurogenic claudication: Secondary | ICD-10-CM | POA: Diagnosis not present

## 2021-04-17 DIAGNOSIS — M5416 Radiculopathy, lumbar region: Secondary | ICD-10-CM | POA: Insufficient documentation

## 2021-04-17 NOTE — Progress Notes (Signed)
Safety precautions to be maintained throughout the outpatient stay will include: orient to surroundings, keep bed in low position, maintain call bell within reach at all times, provide assistance with transfer out of bed and ambulation.  

## 2021-04-17 NOTE — Patient Instructions (Signed)

## 2021-04-17 NOTE — Progress Notes (Signed)
Patient: Edwin Olson ZOXWRUE  Service Category: E/M  Provider: Gillis Santa, MD  DOB: [birth year]  DOS: 04/17/2021  Referring Provider: Deetta Perla, MD  MRN: [id number]  Setting: Ambulatory outpatient  PCP: Idelle Crouch, MD  Type: New Patient  Specialty: Interventional Pain Management    Location: Office  Delivery: Face-to-face     Primary Reason(s) for Visit: Encounter for initial evaluation of one or more chronic problems (new to examiner) potentially causing chronic pain, and posing a threat to normal musculoskeletal function. (Level of risk: High) CC: Hip Pain (right), Back Pain (Right side), and Neck Pain  HPI  [name] is a 80 y.o. year old, male patient, who comes for the first time to our practice referred by Deetta Perla, MD for our initial evaluation of his chronic pain. 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 Today he comes in for evaluation of his Hip Pain (right), Back Pain (Right side), and Neck Pain  Pain Assessment: Location: Right Hip Radiating: down outside of thigh to knee; up right side of back to neck Onset: More than a month ago Duration: Chronic pain Quality: Aching, Throbbing Severity: 9 /10 (subjective, self-reported pain score)  Effect on ADL: limits daily activities; cannot walk for long Timing: Constant Modifying factors: hydrocodone BP: (!) 144/79  HR: 72  Onset and Duration: Gradual and Present longer than 3 months Cause of pain: Work related accident or event Severity: No change since onset Timing: Morning, Afternoon, Not influenced by the time of the day, During activity or exercise, and After activity or exercise Aggravating Factors: Bending, Kneeling, Lifiting, Walking, Walking uphill, and Working Alleviating Factors: Resting, Sitting, TENS, Warm  showers or baths, and physical therapy Associated Problems: Night-time cramps, Erectile dysfunction, and Numbness Quality of Pain: Aching, Agonizing, Annoying, Cramping, Tiring, Uncomfortable, and Work related Previous Examinations or Tests: CT scan, MRI scan, X-rays, and Neurological evaluation Previous Treatments: Epidural steroid injections, Narcotic medications, Physical Therapy, and TENS  Patient is a pleasant 80 year old male who presents with a chief complaint of low back pain with radiation from his right buttock down his posterior lateral leg stopping at his right knee.  This is been going on for many years.  He has a history of lumbar spine surgery 1970s.  No hardware was placed at that time.  He has done physical therapy in the past with limited response.  He is fairly active and tries to walk as much as he can during the day.  He has been evaluated by neurosurgery who did not recommend surgical treatment for his condition.  Patient endorses pain overlying his right SI joint as well.  He has had lumbar epidural steroid injections in the past with limited response however these were over 4 years ago.  He is on a multimodal pain regimen which includes baclofen, Valium 5 mg nightly as needed, gabapentin 600 mg twice daily, hydrocodone as needed for pain.  He is also on insulin as he is a type II diabetic.  Historic Controlled Substance Pharmacotherapy Review   Historical Monitoring: The patient  reports no history of drug use. List of all UDS Test(s): No results found for: MDMA, COCAINSCRNUR, [place], [place], CANNABQUANT, THCU, [place] List of other Serum/Urine Drug Screening Test(s):  No results found for: AMPHSCRSER, BARBSCRSER, BENZOSCRSER, COCAINSCRSER, COCAINSCRNUR, PCPSCRSER, PCPQUANT, THCSCRSER, THCU, CANNABQUANT, OPIATESCRSER, OXYSCRSER, PROPOXSCRSER, [place] Historical Background  Evaluation: [place] PMP: PDMP not reviewed this encounter. Online review of the past 1-monthperiod conducted.              [place] Department of public safety, offender search: (Editor, commissioningInformation) Non-contributory Risk Assessment Profile: Aberrant behavior: None observed or detected today Risk factors for fatal opioid overdose: None identified today  Fatal overdose hazard ratio (HR): Calculation deferred Non-fatal overdose hazard ratio (HR): Calculation deferred Risk of opioid abuse or dependence: 0.7-3.0% with doses ? 36 MME/day and 6.1-26% with doses ? 120 MME/day. Substance use disorder (SUD) risk level: See below Personal History of Substance Abuse (SUD-Substance use disorder):  Alcohol: Negative  Illegal Drugs: Negative  Rx Drugs: Negative  ORT Risk Level calculation: Low Risk  Opioid Risk Tool - 04/17/21 1005       Family History of Substance Abuse   Alcohol Positive Male    Illegal Drugs Negative    Rx Drugs Negative      Personal History of Substance Abuse   Alcohol Negative    Illegal Drugs Negative    Rx Drugs Negative      Age   Age between 156-45years  No      History of Preadolescent Sexual Abuse   History of Preadolescent Sexual Abuse Negative or Male      Psychological Disease   Psychological Disease Negative    Depression Negative      Total Score   Opioid Risk Tool Scoring 3    Opioid Risk Interpretation Low Risk            ORT Scoring interpretation table:  Score <3 = Low Risk for SUD  Score between 4-7 = Moderate Risk for SUD  Score >8 = High Risk for Opioid Abuse   PHQ-2 Depression Scale:  Total score:    PHQ-2 Scoring interpretation table: (Score and probability of major depressive disorder)  Score 0 = No depression  Score 1 = 15.4% Probability  Score 2 = 21.1% Probability  Score 3 = 38.4% Probability  Score 4 = 45.5% Probability  Score 5 = 56.4% Probability  Score 6 = 78.6% Probability   PHQ-9 Depression Scale:  Total score:    PHQ-9 Scoring interpretation table:  Score 0-4 = No depression  Score 5-9 = Mild depression  Score 10-14 = Moderate  depression  Score 15-19 = Moderately severe depression  Score 20-27 = Severe depression (2.4 times higher risk of SUD and 2.89 times higher risk of overuse)    Meds   Current Outpatient Medications:    aspirin EC 81 MG tablet, Take 81 mg by mouth daily., Disp: , Rfl:    baclofen (LIORESAL) 10 MG tablet, Take 10 mg by mouth 3 (three) times daily., Disp: , Rfl:    colchicine 0.6 MG tablet, Take 0.6 mg by mouth daily as needed., Disp: , Rfl:    diazepam (VALIUM) 5 MG tablet, Take 5 mg by mouth at bedtime as needed for anxiety., Disp: , Rfl:    doxycycline (VIBRAMYCIN) 100 MG capsule, Take 1 capsule (100 mg total) by mouth 2 (two) times daily., Disp: 20 capsule, Rfl: 0   gabapentin (NEURONTIN) 600 MG tablet, Take 600 mg by mouth 2 (two) times daily. , Disp: , Rfl:    HYDROcodone-acetaminophen (NORCO/VICODIN) 5-325 MG tablet, Take 1 tablet by mouth every 6 (six) hours as needed for moderate pain., Disp: , Rfl:    insulin glargine (LANTUS) 100 UNIT/ML injection, Inject 50 Units into the skin daily. 50 units at  2100, Disp: , Rfl:    insulin lispro (HUMALOG) 100 UNIT/ML injection, Inject 15-22 Units into the skin 2 (two) times daily. 22 units in the morning and 15 units at night, Disp: , Rfl:    isosorbide dinitrate (ISORDIL) 30 MG tablet, Take 30 mg by mouth 2 (two) times daily., Disp: , Rfl:    lovastatin (MEVACOR) 40 MG tablet, Take 40 mg by mouth at bedtime., Disp: , Rfl:    metFORMIN (GLUCOPHAGE) 500 MG tablet, Take by mouth 2 (two) times daily with a meal., Disp: , Rfl:    metoprolol succinate (TOPROL-XL) 50 MG 24 hr tablet, Take 1 tablet by mouth every morning., Disp: , Rfl:    nitroGLYCERIN (NITROSTAT) 0.4 MG SL tablet, Place 0.4 mg under the tongue every 5 (five) minutes as needed for chest pain., Disp: , Rfl:    Omeprazole-Sodium Bicarbonate (ZEGERID) 20-1100 MG CAPS capsule, Take 1 capsule by mouth daily before breakfast., Disp: , Rfl:    telmisartan (MICARDIS) 80 MG tablet, Take 1 tablet  by mouth every morning., Disp: , Rfl:    zolpidem (AMBIEN) 5 MG tablet, Take by mouth at bedtime as needed for sleep., Disp: , Rfl:    metoprolol succinate (TOPROL-XL) 25 MG 24 hr tablet, Take 1 tablet by mouth every evening., Disp: , Rfl:   Imaging Review  Cervical Imaging: Cervical MR wo contrast: Results for orders placed during the hospital encounter of 11/06/11  MR Cervical Spine Wo Contrast  Narrative *RADIOLOGY REPORT*  Clinical Data: 80 year old male with increased cervical pain, left shoulder and year pain.  MRI CERVICAL SPINE WITHOUT CONTRAST  Technique:  Multiplanar and multiecho pulse sequences of the cervical spine, to include the craniocervical junction and cervicothoracic junction, were obtained according to standard protocol without intravenous contrast.  Comparison: [facility] of cervical radiographs 10/26/2011 and earlier.  Findings: Cervicomedullary junction is within normal limits.  Chronic straightening of cervical lordosis.  Multilevel degenerative disc osteophyte complexes in the cervical spine are detailed below.  There is mild marrow edema anteriorly at the C7-T1 level which appears degenerative.  No definite acute osseous abnormality. Visualized paraspinal soft tissues are within normal limits.  Spinal cord signal is within normal limits at all visualized levels.  C2-C3:  Negative disc.  Moderate left facet hypertrophy.  Mild to moderate left C3 foraminal stenosis.  C3-C4:  Circumferential disc osteophyte complex eccentric to the right.  No spinal stenosis.  Mild left and moderate to severe right C4 foraminal stenosis.  C4-C5:  Circumferential disc osteophyte complex eccentric to the left.  Mild left facet hypertrophy.  No significant spinal stenosis.  Severe left and mild right C5 foraminal stenosis.  C5-C6:  Circumferential disc osteophyte complex eccentric anteriorly.  No significant stenosis.  C6-C7:  Circumferential disc  osteophyte complex eccentric to the right.  Mild ligament flavum hypertrophy.  Borderline to mild spinal stenosis without definite spinal cord mass effect.  Mild left and moderate to severe right C7 foraminal stenosis.  C7-T1:  Circumferential disc osteophyte complex eccentric to the right.  Prominent broad-based component of disc protrusion or extrusion suspected (series 3 image 8, series 7 image 25). Ligament flavum and mild facet hypertrophy.  Mild spinal stenosis with minimal if any mass effect on the spinal cord.  Moderate to severe bilateral C8 foraminal stenosis, with a component of soft disc contributing on the right.  T1-T2:  Negative.  Incidental left T2 perineural cyst partially visible.  IMPRESSION: 1.  Multilevel chronic cervical spondylosis. 2.  Superimposed right  paracentral disc protrusion or extrusion at C7-T1 suspected and contributing to mild spinal stenosis and severe bilateral C8 foraminal stenosis. 3.  Borderline to mild spinal stenosis at C6-C7. 4.  Moderate or severe neural foraminal stenosis at the right C4, left C5 and right C7 nerve levels.  Original Report Authenticated By: Randall An, M.D.  CThoracic DG 2-3 views: Results for orders placed during the hospital encounter of 08/03/15  DG Thoracic Spine 2 View  Narrative CLINICAL DATA:  Patient from home [facility] with complaint of chest pain. Patient recently had a medication switch from losartan to micardis. Patient noted that he will intermittently have a "fluttering feeling" in his chest after exertion.  EXAM: THORACIC SPINE 2 VIEWS  COMPARISON:  The  FINDINGS: There is mild convex left scoliosis versus positioning. No acute fracture or traumatic subluxation. Degenerative changes are seen in the upper lumbar spine and lower cervical spine. No suspicious lytic or blastic lesions are identified.  IMPRESSION: No evidence for acute thoracic spine abnormality.  Degenerative changes in the  cervical spine and lumbar spine.   Electronically Signed By: Nolon Nations M.D. On: 08/03/2015 16:22 MR Lumbar Spine Wo Contrast  Narrative CLINICAL DATA:  Low left-sided back pain for 7 months. No acute injury. Remote back surgery.  EXAM: MRI LUMBAR SPINE WITHOUT CONTRAST  TECHNIQUE: Multiplanar, multisequence MR imaging was performed. No intravenous contrast was administered.  COMPARISON:  Lumbar MRI 11/05/2009.  FINDINGS: Five lumbar type vertebral bodies are assumed. A convex right scoliosis centered at L3 appears mildly progressive. The lateral alignment is normal. There is no evidence of acute fracture or pars defect. There are progressive endplate degenerative changes asymmetric to the left at L2-3 and L3-4.  The conus medullaris extends to the L1 level and appears normal. No paraspinal abnormalities are identified. As before, the left kidney is not visualized in its normal anatomic location.  There is stable mild disc bulging at T12-L1 and L1-2. No spinal stenosis or nerve root encroachment results.  L2-3: There is progressive disc degeneration with annular bulging and interval development of a small medial foraminal disc protrusion on the left. This narrows the left lateral recess and may contribute to left L3 nerve root encroachment. There is mild facet and ligamentous hypertrophy. The foramina appear sufficiently patent.  L3-4: There is chronic degenerative disc disease with loss of disc height and asymmetric disc bulging on the right related to the scoliosis. There is moderate facet and ligamentous hypertrophy. These factors contribute to stable right greater than left lateral recess stenosis. There is mild foraminal narrowing bilaterally which appears unchanged.  L4-5: There is progressive asymmetric disc bulging and osteophyte formation laterally on the right. There is possible extraforaminal right L4 nerve root encroachment. Mild narrowing of both  lateral recesses appears stable. There is stable mild facet and ligamentous hypertrophy.  L5-S1: There are remote postsurgical changes on the left. There is stable chronic disc degeneration with disc bulging and osteophytes asymmetric to the left. There is moderate facet and ligamentous hypertrophy. There is some epidural fibrosis surrounding the left S1 nerve root sleeve which is mildly displaced posteriorly. Mild foraminal narrowing is present bilaterally. This disc space level has a stable appearance.  IMPRESSION: 1. Compared with the prior MRI from 3 years ago, there is a new small medial foraminal disc protrusion on the left at L2-3 which may contribute to left L3 nerve root encroachment. 2. Stable findings at L3-4 with right greater than left lateral recess and mild biforaminal stenosis.  3. Progressive asymmetric disc bulging and osteophytes laterally on the right at L4-5 may contribute extraforaminal right L4 nerve root encroachment. No left-sided nerve root encroachment identified. 4. Stable findings at L5-S1 status post remote left laminectomy. There is stable posterior displacement of the left S1 nerve root and mild biforaminal stenosis.   Electronically Signed By: Camie Patience On: 03/28/2013 09:36 Narrative CLINICAL DATA:  Chronic low back pain  EXAM: MRI LUMBAR SPINE WITHOUT AND WITH CONTRAST  TECHNIQUE: Multiplanar and multiecho pulse sequences of the lumbar spine were obtained without and with intravenous contrast.  CONTRAST:  31m GADAVIST GADOBUTROL 1 MMOL/ML IV SOLN  COMPARISON:  03/28/2013  FINDINGS: Segmentation:  Normal  Alignment: Grade 1 retrolisthesis at L2-3 and L3-4. Right convex scoliosis apex at L3.  Vertebrae:  No fracture, evidence of discitis, or bone lesion.  Conus medullaris and cauda equina: Conus extends to the L1 level. Conus and cauda equina appear normal.  Paraspinal and other soft tissues: Negative.  Disc levels:  The  T11-12 level is imaged only in the sagittal plane and shows no spinal canal or neural foraminal stenosis.  T12-L1: Mild disc bulge. No stenosis.  L1-L2: Normal disc space and facet joints. There is no spinal canal stenosis. Neural foraminal stenosis.  L2-L3: Disc space narrowing with endplate spurring. Large left lateral osteophyte. Left lateral recess narrowing without central spinal canal stenosis. Severe left neural foraminal stenosis. Unchanged  L3-L4: Disc space narrowing with endplate spurring and small disc bulge. Mild spinal canal stenosis. Moderate right and severe left neural foraminal stenosis. Unchanged  L4-L5: Disc space narrowing with right asymmetric bulge. There is no spinal canal stenosis. Unchanged moderate right neural foraminal stenosis.  L5-S1: Intermediate sized disc bulge. There is no spinal canal stenosis. Unchanged moderate right neural foraminal stenosis.  Visualized sacrum: Normal.  IMPRESSION: 1. Unchanged examination of the lumbar spine with moderate-to-severe neural foraminal stenosis at left L2-3, bilateral L3-4 and right L4-5. 2. Mild spinal canal stenosis at L3-L4, unchanged.   Electronically Signed By: KUlyses JarredM.D. On: 10/30/2019 22:15  Complexity Note: Imaging results reviewed. Results shared with Mr. BSawyers using Layman's terms.                         ROS  Cardiovascular: Heart trouble, Daily Aspirin intake, High blood pressure, Heart attack ( Date:  ), Heart valve problems, Heart catheterization, and Blood thinners:  Anticoagulant Pulmonary or Respiratory: Snoring  Neurological: Abnormal skin sensations (Peripheral Neuropathy) Psychological-Psychiatric: Anxiousness Gastrointestinal: Reflux or heatburn Genitourinary: Kidney disease and Passing kidney stones Hematological: Brusing easily and Bleeding easily Endocrine: High blood sugar controlled without the use of insulin (NIDDM) Rheumatologic: No reported rheumatological  signs and symptoms such as fatigue, joint pain, tenderness, swelling, redness, heat, stiffness, decreased range of motion, with or without associated rash Musculoskeletal: Negative for myasthenia gravis, muscular dystrophy, multiple sclerosis or malignant hyperthermia Work History: Quit going to work on his/her own  Allergies  Mr. BPoplaskiis allergic to clonidine, dapagliflozin, glipizide, atorvastatin, crestor [rosuvastatin], doxycycline, fenofibrate, isosorbide, meclizine, percocet [oxycodone-acetaminophen], ultram [tramadol], amlodipine, atenolol, clopidogrel, lisinopril, losartan, metaxalone, penicillins, and plavix [clopidogrel bisulfate].  Laboratory Chemistry Profile   Renal Lab Results  Component Value Date   BUN 13 05/29/2020   CREATININE 1.36 (H) 05/29/2020   GFRAA 55 (L) 02/24/2020   GFRNONAA 54 (L) 05/29/2020   PROTEINUR NEGATIVE 08/03/2015     Electrolytes Lab Results  Component Value Date   NA 139 05/29/2020   K  4.1 05/29/2020   CL 104 05/29/2020   CALCIUM 9.3 05/29/2020   MG 1.8 08/14/2013     Hepatic Lab Results  Component Value Date   AST 36 12/25/2014   ALT 35 12/25/2014   ALBUMIN 4.1 12/25/2014   ALKPHOS 73 12/25/2014   LIPASE 36 12/21/2014     ID Lab Results  Component Value Date   SARSCOV2NAA NEGATIVE 07/26/2020     Bone No results found for: [place], [id number], TX6468EH2, ZY2482NO0, 25OHVITD1, 25OHVITD2, 25OHVITD3, TESTOFREE, TESTOSTERONE   Endocrine Lab Results  Component Value Date   GLUCOSE 217 (H) 05/29/2020   GLUCOSEU NEGATIVE 08/03/2015   HGBA1C 7.1 (H) 02/26/2016     Neuropathy Lab Results  Component Value Date   HGBA1C 7.1 (H) 02/26/2016     CNS No results found for: COLORCSF, APPEARCSF, RBCCOUNTCSF, WBCCSF, POLYSCSF, LYMPHSCSF, EOSCSF, PROTEINCSF, GLUCCSF, JCVIRUS, CSFOLI, IGGCSF, LABACHR, ACETBL, LABACHR, ACETBL   Inflammation (CRP: Acute  ESR: Chronic) No results found for: CRP, ESRSEDRATE, LATICACIDVEN    Rheumatology No results found for: RF, ANA, LABURIC, URICUR, LYMEIGGIGMAB, LYMEABIGMQN, HLAB27   Coagulation Lab Results  Component Value Date   INR 1.0 05/10/2020   LABPROT 13.0 05/10/2020   PLT 259 05/29/2020     Cardiovascular Lab Results  Component Value Date   CKTOTAL 88 08/13/2013   CKMB 1.5 08/13/2013   TROPONINI <0.03 02/27/2016   HGB 12.6 (L) 05/29/2020   HCT 39.2 05/29/2020     Screening Lab Results  Component Value Date   SARSCOV2NAA NEGATIVE 07/26/2020     Cancer No results found for: CEA, CA125, LABCA2   Allergens No results found for: ALMOND, APPLE, ASPARAGUS, AVOCADO, BANANA, BARLEY, BASIL, BAYLEAF, GREENBEAN, LIMABEAN, WHITEBEAN, BEEFIGE, REDBEET, BLUEBERRY, BROCCOLI, CABBAGE, MELON, CARROT, CASEIN, CASHEWNUT, CAULIFLOWER, CELERY     Note: Lab results reviewed.  PFSH  Drug: Mr. Hardacre  reports no history of drug use. Alcohol:  reports no history of alcohol use. Tobacco:  reports that he has never smoked. He has never used smokeless tobacco. Medical:  has a past medical history of Arthritis, Coronary artery disease, Diabetes mellitus without complication ([place]), Diverticulitis, Diverticulitis, Dysrhythmia, GERD (gastroesophageal reflux disease), Hypertension, and Mitral valve insufficiency. Family: family history is not on file.  Past Surgical History:  Procedure Laterality Date   BACK SURGERY     CARDIAC CATHETERIZATION     cardiac stents      cardiac stents    CHOLECYSTECTOMY     COLONOSCOPY WITH PROPOFOL N/A 05/10/2015   Procedure: COLONOSCOPY WITH PROPOFOL;  Surgeon: Hulen Luster, MD;  Location[facility] ENDOSCOPY;  Service: Gastroenterology;  Laterality: N/A;   COLONOSCOPY WITH PROPOFOL N/A 07/30/2020   Procedure: COLONOSCOPY WITH PROPOFOL;  Surgeon: [name], MD;  Location: [facility] ENDOSCOPY;  Service: Endoscopy;  Laterality: N/A;   Active Ambulatory Problems    Diagnosis Date Noted   Unstable angina ([place]) 02/26/2016   Lumbar radiculopathy  04/17/2021   Spinal stenosis, lumbar region, with neurogenic claudication 04/17/2021   Lumbar facet arthropathy 04/17/2021   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 In no apparent acute distress Vitals:   04/17/21 0942  BP: (!) 144/79  Pulse: 72  Resp: 16  Temp: (!) 96.4 F (35.8 C)  TempSrc: Temporal  SpO2: 97%  Weight: 189 lb (85.7 kg)  Height: 5' 11"  (1.803 m)   BMI Assessment: Estimated body mass index is 26.36 kg/m as calculated from the following:   Height as of this encounter: 5' 11"  (1.803 m).   Weight as of this encounter: 189 lb (85.7 kg).  BMI interpretation table: BMI level Category Range association with higher incidence of chronic pain  <18 kg/m2 Underweight   18.5-24.9 kg/m2 Ideal body weight   25-29.9 kg/m2 Overweight Increased incidence by 20%  30-34.9 kg/m2 Obese (Class I) Increased incidence by 68%  35-39.9 kg/m2 Severe obesity (Class II) Increased incidence by 136%  >40 kg/m2 Extreme obesity (Class III) Increased incidence by 254%   Patient's current BMI Ideal Body weight  Body mass index is 26.36 kg/m. Ideal body weight: 75.3 kg (166 lb 0.1 oz) Adjusted ideal body weight: 79.5 kg (175 lb 3.3 oz)   BMI Readings from Last 4 Encounters:  04/17/21 26.36 kg/m  07/30/20 25.54 kg/m  05/29/20 26.36 kg/m  02/24/20 26.69 kg/m   Wt Readings from Last 4 Encounters:  04/17/21 189 lb (85.7 kg)  07/30/20 178 lb (80.7 kg)  05/29/20 189 lb  (85.7 kg)  02/24/20 186 lb (84.4 kg)    Psych/Mental status: Alert, oriented x 3 (person, place, & time)       Eyes: PERLA Respiratory: No evidence of acute respiratory distress  Lumbar Spine Area Exam  Skin & Axial Inspection: Well healed scar from previous spine surgery detected Alignment: Symmetrical Functional ROM: Pain restricted ROM       Stability: No instability detected Muscle Tone/Strength: Functionally intact. No obvious neuro-muscular anomalies detected. Sensory (Neurological): Dermatomal pain pattern Palpation: No palpable anomalies       Provocative Tests: Hyperextension/rotation test: deferred today       Lumbar quadrant test (Kemp's test): deferred today       Lateral bending test: (+) ipsilateral radicular pain, on the right. Positive for right-sided foraminal stenosis. Patrick's Maneuver: (+) for right-sided S-I arthralgia             FABER* test: (+) for right-sided S-I arthralgia             S-I anterior distraction/compression test: deferred today         S-I lateral compression test: deferred today         S-I Thigh-thrust test: deferred today         S-I Gaenslen's test: deferred today         *(Flexion, ABduction and External Rotation) Gait & Posture Assessment  Ambulation: Limited Gait: Antalgic Posture: Difficulty standing up straight, due to pain  Lower Extremity Exam    Side: Right lower extremity  Side: Left lower extremity  Stability: No instability observed          Stability: No instability observed          Skin & Extremity Inspection: Skin color, temperature, and hair growth are WNL. No peripheral edema or cyanosis. No masses, redness, swelling, asymmetry, or associated skin lesions. No contractures.  Skin & Extremity Inspection: Skin color, temperature, and hair growth are WNL. No peripheral edema or cyanosis. No masses, redness, swelling, asymmetry, or associated skin lesions. No contractures.  Functional ROM: Pain restricted ROM hip                   Functional ROM: Unrestricted ROM                  Muscle Tone/Strength: Functionally intact. No obvious neuro-muscular anomalies detected.  Muscle Tone/Strength: Functionally intact. No obvious neuro-muscular anomalies detected.  Sensory (Neurological): Neuropathic pain pattern, dermatomal        Sensory (Neurological): Unimpaired        DTR: Patellar: deferred today Achilles: deferred today Plantar: deferred today  DTR: Patellar: deferred today Achilles: deferred today Plantar: deferred today  Palpation: No palpable anomalies  Palpation: No palpable anomalies    Assessment  Primary Diagnosis & Pertinent Problem List: The primary encounter diagnosis was Chronic radicular lumbar pain. 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  Visit Diagnosis (New problems to examiner): 1. Chronic radicular lumbar pain   2. Spinal stenosis, lumbar region, with neurogenic claudication   3. 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 Lumbar facet arthropathy   5. 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 Chronic pain syndrome    Plan of Care (Initial workup plan)   Recommend lumbar epidural steroid injection at L3-L4, interlaminar approach for chronic lumbar radicular pain, spinal stenosis which is multilevel.  I did review the patient's lumbar MRI with him in great detail.  He has significant ligamentum flavum hypertrophy, significant facet arthrosis, spinal stenosis as well.  Symptoms are consistent with neurogenic claudication.  Patient's pain could also be related to SI joint arthralgia, piriformis syndrome as his pain is in his right buttock as well.  Positive Patrick's Faber's.  Discussed diagnostic SI joint and possible piriformis injection if lumbar epidural injection  is not helpful.  Future considerations also include diagnostic lumbar facet medial branch nerve blocks, lumbar pain, Qutenza capsaicin treatment for painful diabetic neuropathy   Continue medication management with PCP   Procedure Orders         Lumbar Epidural Injection       Interventional management options: Mr. Nester was informed that there is no guarantee that he would be a candidate for interventional therapies. The decision will be based on the results of diagnostic studies, as well as Mr. Trowbridge risk profile.  Procedure(s) under consideration:  Lumbar epidural steroid injection Caudal injection Sacroiliac injection, piriformis injection Qutenza treatment for diabetic neuropathy.  Provider-requested follow-up: Return in about 6 days (around 04/23/2021) for R L3/4 ESI, without sedation. I spent a total of 60 minutes reviewing chart data, face-to-face evaluation with the patient, counseling and coordination of care as detailed above.  No future appointments.   Note by: Gillis Santa, MD Date: 04/17/2021; Time: 10:25 AM

## 2021-04-23 ENCOUNTER — Ambulatory Visit: Payer: Medicare Other | Admitting: Student in an Organized Health Care Education/Training Program

## 2021-04-30 ENCOUNTER — Ambulatory Visit (HOSPITAL_BASED_OUTPATIENT_CLINIC_OR_DEPARTMENT_OTHER): Payer: Medicare Other | Admitting: Student in an Organized Health Care Education/Training Program

## 2021-04-30 ENCOUNTER — Ambulatory Visit
Admission: RE | Admit: 2021-04-30 | Discharge: 2021-04-30 | Disposition: A | Discharge status: Home / Self Care | Payer: Medicare Other | Source: Ambulatory Visit | Attending: Student in an Organized Health Care Education/Training Program | Admitting: Student in an Organized Health Care Education/Training Program

## 2021-04-30 ENCOUNTER — Other Ambulatory Visit: Payer: Self-pay

## 2021-04-30 ENCOUNTER — Encounter: Payer: Self-pay | Admitting: Student in an Organized Health Care Education/Training Program

## 2021-04-30 VITALS — BP 126/85 | HR 72 | Temp 97.1°F | Resp 15 | Ht 71.0 in | Wt 176.0 lb

## 2021-04-30 DIAGNOSIS — G8929 Other chronic pain: Secondary | ICD-10-CM | POA: Diagnosis not present

## 2021-04-30 DIAGNOSIS — M5416 Radiculopathy, lumbar region: Secondary | ICD-10-CM | POA: Insufficient documentation

## 2021-04-30 DIAGNOSIS — M48062 Spinal stenosis, lumbar region with neurogenic claudication: Secondary | ICD-10-CM | POA: Diagnosis present

## 2021-04-30 DIAGNOSIS — G894 Chronic pain syndrome: Secondary | ICD-10-CM

## 2021-04-30 MED ORDER — SODIUM CHLORIDE 0.9% FLUSH
2.0000 mL | Freq: Once | INTRAVENOUS | Status: AC
  Administered 2021-04-30: 2 mL

## 2021-04-30 MED ORDER — LIDOCAINE HCL 2 % IJ SOLN
20.0000 mL | Freq: Once | INTRAMUSCULAR | Status: AC
  Administered 2021-04-30: 400 mg
  Filled 2021-04-30: qty 20

## 2021-04-30 MED ORDER — SODIUM CHLORIDE (PF) 0.9 % IJ SOLN
INTRAMUSCULAR | Status: AC
  Filled 2021-04-30: qty 10

## 2021-04-30 MED ORDER — DEXAMETHASONE SODIUM PHOSPHATE 10 MG/ML IJ SOLN
10.0000 mg | Freq: Once | INTRAMUSCULAR | Status: AC
  Administered 2021-04-30: 10 mg
  Filled 2021-04-30: qty 1

## 2021-04-30 MED ORDER — ROPIVACAINE HCL 2 MG/ML IJ SOLN
INTRAMUSCULAR | Status: AC
  Filled 2021-04-30: qty 20

## 2021-04-30 MED ORDER — ROPIVACAINE HCL 2 MG/ML IJ SOLN
2.0000 mL | Freq: Once | INTRAMUSCULAR | Status: AC
  Administered 2021-04-30: 2 mL via EPIDURAL

## 2021-04-30 MED ORDER — IOHEXOL 180 MG/ML  SOLN
10.0000 mL | Freq: Once | INTRAMUSCULAR | Status: AC
  Administered 2021-04-30: 5 mL via EPIDURAL

## 2021-04-30 NOTE — Progress Notes (Signed)
PROVIDER NOTE: Information contained herein reflects review and annotations entered in association with encounter. Interpretation of such information and data should be left to medically-trained personnel. Information provided to patient can be located elsewhere in the medical record under "Patient Instructions". Document created using STT-dictation technology, any transcriptional errors that may result from process are unintentional.    Patient: Edwin Olson  Service Category: Procedure Provider: Gillis Santa, MD DOB: 25-Nov-1940 DOS: 04/30/2021 Location: [facility] MRN: [id number] Setting: Ambulatory - outpatient Referring Provider: Idelle Crouch, MD Type: Established Patient Specialty: Interventional Pain Management PCP: Idelle Crouch, MD  Primary Reason for Visit: Interventional Pain Management Treatment. CC: Hip Pain (right)    Procedure:          Anesthesia, Analgesia, Anxiolysis:  Type: Diagnostic Epidural Steroid Injection #1  Region: Caudal Level: Sacrococcygeal   Laterality: Midline aiming at the right  Anesthesia: Local (1-2% Lidocaine)  Anxiolysis: None  Sedation: None  Guidance: Fluoroscopy           Position: Prone   Indications: 1. Chronic radicular lumbar pain   2. Lumbar radiculopathy (Right L4/5)   3. Spinal stenosis, lumbar region, with neurogenic claudication   4. Chronic pain syndrome    Pain Score: Pre-procedure: 5 /10 Post-procedure: 0-No pain/10     Pre-op H&P Assessment:  Edwin Olson is a 80 y.o. (year old), male patient, seen today for interventional treatment. He  has a past surgical history that includes cardiac stents ; Back surgery; Cholecystectomy; Colonoscopy with propofol (N/A, 05/10/2015); Cardiac catheterization; and Colonoscopy with propofol (N/A, 07/30/2020). Edwin Olson has a current medication list which includes the following prescription(s): aspirin ec, baclofen, colchicine, diazepam, doxycycline, gabapentin,  hydrocodone-acetaminophen, insulin glargine, insulin lispro, isosorbide dinitrate, lovastatin, metformin, metoprolol succinate, nitroglycerin, omeprazole-sodium bicarbonate, zolpidem, metoprolol succinate, and telmisartan. His primarily concern today is the Hip Pain (right)  Initial Vital Signs:  Pulse/HCG Rate: 72ECG Heart Rate: 72 Temp:  (!) 97.1 F (36.2 C) Resp: 14 BP: 130/85 SpO2: 99 %  BMI: Estimated body mass index is 24.55 kg/m as calculated from the following:   Height as of this encounter: 5\' 11"  (1.803 m).   Weight as of this encounter: 176 lb (79.8 kg).  Risk Assessment: Allergies: Reviewed. He is allergic to clonidine, dapagliflozin, glipizide, atorvastatin, crestor [rosuvastatin], doxycycline, fenofibrate, isosorbide, meclizine, percocet [oxycodone-acetaminophen], ultram [tramadol], amlodipine, atenolol, clopidogrel, lisinopril, losartan, metaxalone, penicillins, and plavix [clopidogrel bisulfate].  Allergy Precautions: None required Coagulopathies: Reviewed. None identified.  Blood-thinner therapy: None at this time Active Infection(s): Reviewed. None identified. Edwin Olson is afebrile  Site Confirmation: Edwin Olson was asked to confirm the procedure and laterality before marking the site Procedure checklist: Completed Consent: Before the procedure and under the influence of no sedative(s), amnesic(s), or anxiolytics, the patient was informed of the treatment options, risks and possible complications. To fulfill our ethical and legal obligations, as recommended by the American Medical Association's Code of Ethics, I have informed the patient of my clinical impression; the nature and purpose of the treatment or procedure; the risks, benefits, and possible complications of the intervention; the alternatives, including doing nothing; the risk(s) and benefit(s) of the alternative treatment(s) or procedure(s); and the risk(s) and benefit(s) of doing nothing. The patient was  provided information about the general risks and possible complications associated with the procedure. These may include, but are not limited to: failure to achieve desired goals, infection, bleeding, organ or nerve damage, allergic reactions, paralysis, and death. In addition, the patient was informed  of those risks and complications associated to Spine-related procedures, such as failure to decrease pain; infection (i.e.: Meningitis, epidural or intraspinal abscess); bleeding (i.e.: epidural hematoma, subarachnoid hemorrhage, or any other type of intraspinal or peri-dural bleeding); organ or nerve damage (i.e.: Any type of peripheral nerve, nerve root, or spinal cord injury) with subsequent damage to sensory, motor, and/or autonomic systems, resulting in permanent pain, numbness, and/or weakness of one or several areas of the body; allergic reactions; (i.e.: anaphylactic reaction); and/or death. Furthermore, the patient was informed of those risks and complications associated with the medications. These include, but are not limited to: allergic reactions (i.e.: anaphylactic or anaphylactoid reaction(s)); adrenal axis suppression; blood sugar elevation that in diabetics may result in ketoacidosis or comma; water retention that in patients with history of congestive heart failure may result in shortness of breath, pulmonary edema, and decompensation with resultant heart failure; weight gain; swelling or edema; medication-induced neural toxicity; particulate matter embolism and blood vessel occlusion with resultant organ, and/or nervous system infarction; and/or aseptic necrosis of one or more joints. Finally, the patient was informed that Medicine is not an exact science; therefore, there is also the possibility of unforeseen or unpredictable risks and/or possible complications that may result in a catastrophic outcome. The patient indicated having understood very clearly. We have given the patient no guarantees  and we have made no promises. Enough time was given to the patient to ask questions, all of which were answered to the patient's satisfaction. Edwin Olson has indicated that he wanted to continue with the procedure. Attestation: I, the ordering provider, attest that I have discussed with the patient the benefits, risks, side-effects, alternatives, likelihood of achieving goals, and potential problems during recovery for the procedure that I have provided informed consent. Date  Time: 04/30/2021 11:11 AM  Pre-Procedure Preparation:  Monitoring: As per clinic protocol. Respiration, ETCO2, SpO2, BP, heart rate and rhythm monitor placed and checked for adequate function Safety Precautions: Patient was assessed for positional comfort and pressure points before starting the procedure. Time-out: I initiated and conducted the "Time-out" before starting the procedure, as per protocol. The patient was asked to participate by confirming the accuracy of the "Time Out" information. Verification of the correct person, site, and procedure were performed and confirmed by me, the nursing staff, and the patient. "Time-out" conducted as per Joint Commission's Universal Protocol (UP.01.01.01). Time: 1141  Description of Procedure:          Target Area: Caudal Epidural Canal. Approach: Midline approach. Area Prepped: Entire Posterior Sacrococcygeal Region DuraPrep (Iodine Povacrylex [0.7% available iodine] and Isopropyl Alcohol, 74% w/w) Safety Precautions: Aspiration looking for blood return was conducted prior to all injections. At no point did we inject any substances, as a needle was being advanced. No attempts were made at seeking any paresthesias. Safe injection practices and needle disposal techniques used. Medications properly checked for expiration dates. SDV (single dose vial) medications used. Description of the Procedure: Protocol guidelines were followed. The patient was placed in position over the  fluoroscopy table. The target area was identified and the area prepped in the usual manner. Skin & deeper tissues infiltrated with local anesthetic. Appropriate amount of time allowed to pass for local anesthetics to take effect. The procedure needles were then advanced to the target area. Proper needle placement secured. Negative aspiration confirmed. Solution injected in intermittent fashion, asking for systemic symptoms every 0.5cc of injectate. The needles were then removed and the area cleansed, making sure to leave some of the  prepping solution back to take advantage of its long term bactericidal properties. Vitals:   04/30/21 1138 04/30/21 1141 04/30/21 1146 04/30/21 1153  BP: 137/77 128/71 129/76 126/85  Pulse:      Resp: 15 13 15 15   Temp:      TempSrc:      SpO2: 99% 98% 98% 98%  Weight:      Height:        Start Time: 1141 hrs. End Time: 1150 hrs. Materials:  Needle(s) Type: Epidural needle Gauge: 22G Length: 3.5-in Medication(s): Please see orders for medications and dosing details.  5cc solution made of 2 cc of preservative-free saline, 2 cc of 0.2% ropivacaine, 1 cc of Decadron 10 mg/cc.   Imaging Guidance (Spinal):          Type of Imaging Technique: Fluoroscopy Guidance (Spinal) Indication(s): Assistance in needle guidance and placement for procedures requiring needle placement in or near specific anatomical locations not easily accessible without such assistance. Exposure Time: Please see nurses notes. Contrast: Before injecting any contrast, we confirmed that the patient did not have an allergy to iodine, shellfish, or radiological contrast. Once satisfactory needle placement was completed at the desired level, radiological contrast was injected. Contrast injected under live fluoroscopy. No contrast complications. See chart for type and volume of contrast used. Fluoroscopic Guidance: I was personally present during the use of fluoroscopy. "Tunnel Vision Technique" used  to obtain the best possible view of the target area. Parallax error corrected before commencing the procedure. "Direction-depth-direction" technique used to introduce the needle under continuous pulsed fluoroscopy. Once target was reached, antero-posterior, oblique, and lateral fluoroscopic projection used confirm needle placement in all planes. Images permanently stored in EMR. Interpretation: I personally interpreted the imaging intraoperatively. Adequate needle placement confirmed in multiple planes. Appropriate spread of contrast into desired area was observed. No evidence of afferent or efferent intravascular uptake. No intrathecal or subarachnoid spread observed. Permanent images saved into the patient's record.   Post-operative Assessment:  Post-procedure Vital Signs:  Pulse/HCG Rate: 7272 Temp:  (!) 97.1 F (36.2 C) Resp: 15 BP: 126/85 SpO2: 98 %  EBL: None  Complications: No immediate post-treatment complications observed by team, or reported by patient.  Note: The patient tolerated the entire procedure well. A repeat set of vitals were taken after the procedure and the patient was kept under observation following institutional policy, for this type of procedure. Post-procedural neurological assessment was performed, showing return to baseline, prior to discharge. The patient was provided with post-procedure discharge instructions, including a section on how to identify potential problems. Should any problems arise concerning this procedure, the patient was given instructions to immediately contact us, at any time, without hesitation. In any case, we plan to contact the patient by telephone for a follow-up status report regarding this interventional procedure.  Comments:  No additional relevant information.  Plan of Care  Orders:  Orders Placed This Encounter  Procedures   DG PA[facility] C-ARM 1-60 MIN NO REPORT    Intraoperative interpretation by procedural physician at [facility].    Standing Status:   Standing    Number of Occurrences:   1    Order Specific Question:   Reason for exam:    Answer:   Assistance in needle guidance and placement for procedures requiring needle placement in or near specific anatomical locations not easily accessible without such assistance.     Medications ordered for procedure: Meds ordered this encounter  Medications   iohexol (OMNIPAQUE) 180  MG/ML injection 10 mL    Must be Myelogram-compatible. If not available, you may substitute with a water-soluble, non-ionic, hypoallergenic, myelogram-compatible radiological contrast medium.   lidocaine (XYLOCAINE) 2 % (with pres) injection 400 mg   sodium chloride flush (NS) 0.9 % injection 2 mL   ropivacaine (PF) 2 mg/mL (0.2%) (NAROPIN) injection 2 mL   dexamethasone (DECADRON) injection 10 mg   Medications administered: We administered iohexol, lidocaine, sodium chloride flush, ropivacaine (PF) 2 mg/mL (0.2%), and dexamethasone.  See the medical record for exact dosing, route, and time of administration.  Follow-up plan:   Return in about 4 weeks (around 05/28/2021) for Post Procedure Evaluation, virtual.     Caudal ESI 04/30/21  Recent Visits Date Type Provider Dept  04/17/21 Office Visit Gillis Santa, MD [facility]  Showing recent visits within past 90 days and meeting all other requirements Today's Visits Date Type Provider Dept  04/30/21 Procedure visit Gillis Santa, MD [facility]  Showing today's visits and meeting all other requirements Future Appointments Date Type Provider Dept  06/04/21 Appointment Gillis Santa, MD [facility]  Showing future appointments within next 90 days and meeting all other requirements Disposition: Discharge home  Discharge (Date  Time): 04/30/2021; 1159 hrs.   Primary Care Physician: Idelle Crouch, MD Location: [facility] Note by: Gillis Santa, MD Date:  04/30/2021; Time: 1:15 PM  Disclaimer:  Medicine is not an exact science. The only guarantee in medicine is that nothing is guaranteed. It is important to note that the decision to proceed with this intervention was based on the information collected from the patient. The Data and conclusions were drawn from the patient's questionnaire, the interview, and the physical examination. Because the information was provided in large part by the patient, it cannot be guaranteed that it has not been purposely or unconsciously manipulated. Every effort has been made to obtain as much relevant data as possible for this evaluation. It is important to note that the conclusions that lead to this procedure are derived in large part from the available data. Always take into account that the treatment will also be dependent on availability of resources and existing treatment guidelines, considered by other Pain Management Practitioners as being common knowledge and practice, at the time of the intervention. For Medico-Legal purposes, it is also important to point out that variation in procedural techniques and pharmacological choices are the acceptable norm. The indications, contraindications, technique, and results of the above procedure should only be interpreted and judged by a Board-Certified Interventional Pain Specialist with extensive familiarity and expertise in the same exact procedure and technique.

## 2021-04-30 NOTE — Progress Notes (Signed)
Safety precautions to be maintained throughout the outpatient stay will include: orient to surroundings, keep bed in low position, maintain call bell within reach at all times, provide assistance with transfer out of bed and ambulation.  

## 2021-04-30 NOTE — Patient Instructions (Signed)

## 2021-05-01 ENCOUNTER — Telehealth: Payer: Self-pay

## 2021-05-01 NOTE — Telephone Encounter (Signed)
Post procedure phone call. Patient states he is doing well.  

## 2021-05-16 ENCOUNTER — Telehealth: Payer: Self-pay

## 2021-05-16 NOTE — Telephone Encounter (Signed)
Patient states Dr. Holley Raring did a procedure on him a couple of weeks ago, the first place he tried to go in didn't work so he went into a second space. He is saying the first space Dr. Holley Raring tried to go into is puffy, swelling, and giving him pain.

## 2021-05-19 ENCOUNTER — Other Ambulatory Visit: Payer: Self-pay

## 2021-05-19 ENCOUNTER — Ambulatory Visit
Payer: Medicare Other | Attending: Student in an Organized Health Care Education/Training Program | Admitting: Student in an Organized Health Care Education/Training Program

## 2021-05-19 ENCOUNTER — Encounter: Payer: Self-pay | Admitting: Student in an Organized Health Care Education/Training Program

## 2021-05-19 ENCOUNTER — Telehealth: Payer: Self-pay | Admitting: *Deleted

## 2021-05-19 VITALS — BP 159/83 | HR 73 | Temp 97.2°F | Resp 15 | Ht 71.0 in | Wt 182.0 lb

## 2021-05-19 DIAGNOSIS — B999 Unspecified infectious disease: Secondary | ICD-10-CM | POA: Insufficient documentation

## 2021-05-19 DIAGNOSIS — M7989 Other specified soft tissue disorders: Secondary | ICD-10-CM | POA: Insufficient documentation

## 2021-05-19 MED ORDER — CEPHALEXIN 500 MG PO CAPS: 500.0000 mg | ORAL_CAPSULE | Freq: Four times a day (QID) | ORAL | 0 refills | Status: AC

## 2021-05-19 NOTE — Telephone Encounter (Signed)
Returned patient phone call.  He states that with last procedure, the first space that BL tried to enter was too tight and he was unable to gain access.  The level of insertion was changed.  Patient states that at the first injections site, it is swollen, puffy, painful and black.  States when he reached around to the back the site ruptured and "got all over him".  Said he had some prednisone and he began taking them and he felt that things got better.  I told him that I feel we should take a look at this.  Patient states he can arrive by 2.  I told him that would be good and we will have a look at it and let BL assess.

## 2021-05-19 NOTE — Progress Notes (Signed)
PROVIDER NOTE: Information contained herein reflects review and annotations entered in association with encounter. Interpretation of such information and data should be left to medically-trained personnel. Information provided to patient can be located elsewhere in the medical record under "Patient Instructions". Document created using STT-dictation technology, any transcriptional errors that may result from process are unintentional.    Patient: Edwin Olson [email or web address]  Service Category: E/M  Provider: Gillis Santa, MD  DOB: 1941/02/28  DOS: 05/19/2021  Specialty: Interventional Pain Management  MRN: [id number]  Setting: Ambulatory outpatient  PCP: Idelle Crouch, MD  Type: Established Patient    Referring Provider: Idelle Crouch, MD  Location: Office  Delivery: Face-to-face     HPI  [name], a 80 y.o. year old male, is here today because of his Soft tissue infection of lumbar spine [M79.89, B99.9]. [name] primary complain today is lesion of right buttock Pain Assessment: Severity of Chronic pain is reported as a 5 /10. Location: Back Lower/denies. Onset:  . Quality: Dull. Timing: Intermittent. Modifying factor(s): hydrocodone. Vitals:  height is 5' 11"  (1.803 m) and weight is 182 lb (82.6 kg). His temporal temperature is 97.2 F (36.2 C) (abnormal). His blood pressure is 159/83 (abnormal) and his pulse is 73. His respiration is 15 and oxygen saturation is 100%.   Reason for encounter: post-procedure assessment.   Patient is status post right caudal ESI 04/30/2021.  He states that the procedure did not provide long-lasting pain relief.  Approximately 4 days after his injection, he noticed an area adjacent to his injection site that became tender to palpation and somewhat red.  He states that the redness has improved over the last couple of days but he still wanted to come in to have it looked at.  No fevers or drainage noted.    ROS  Constitutional: Denies any fever or  chills Gastrointestinal: No reported hemesis, hematochezia, vomiting, or acute GI distress Musculoskeletal:  Right buttock pain that is improving. Neurological: No reported episodes of acute onset apraxia, aphasia, dysarthria, agnosia, amnesia, paralysis, loss of coordination, or loss of consciousness  Medication Review  HYDROcodone-acetaminophen, Omeprazole-Sodium Bicarbonate, aspirin EC, baclofen, cephALEXin, colchicine, diazepam, doxycycline, gabapentin, insulin glargine, insulin lispro, isosorbide dinitrate, lovastatin, metFORMIN, metoprolol succinate, nitroGLYCERIN, telmisartan, and zolpidem  History Review  Allergy: [name] is allergic to clonidine, dapagliflozin, glipizide, atorvastatin, crestor [rosuvastatin], doxycycline, fenofibrate, isosorbide, meclizine, percocet [oxycodone-acetaminophen], ultram [tramadol], amlodipine, atenolol, clopidogrel, lisinopril, losartan, metaxalone, penicillins, and plavix [clopidogrel bisulfate]. Drug: [name]  reports no history of drug use. Alcohol:  reports no history of alcohol use. Tobacco:  reports that he has never smoked. He has never used smokeless tobacco. Social: [name]  reports that he has never smoked. He has never used smokeless tobacco. He reports that he does not drink alcohol and does not use drugs. Medical:  has a past medical history of Arthritis, Coronary artery disease, Diabetes mellitus without complication ([place]), Diverticulitis, Diverticulitis, Dysrhythmia, GERD (gastroesophageal reflux disease), Hypertension, and Mitral valve insufficiency. Surgical: [name]  has a past surgical history that includes cardiac stents ; Back surgery; Cholecystectomy; Colonoscopy with propofol (N/A, 05/10/2015); Cardiac catheterization; and Colonoscopy with propofol (N/A, 07/30/2020). Family: family history is not on file.  Laboratory Chemistry Profile   Renal Lab Results  Component Value Date   BUN 13 05/29/2020   CREATININE 1.36 (H)  05/29/2020   GFRAA 55 (L) 02/24/2020   GFRNONAA 54 (L) 05/29/2020    Hepatic Lab Results  Component Value Date   AST  36 12/25/2014   ALT 35 12/25/2014   ALBUMIN 4.1 12/25/2014   ALKPHOS 73 12/25/2014   LIPASE 36 12/21/2014    Electrolytes Lab Results  Component Value Date   NA 139 05/29/2020   K 4.1 05/29/2020   CL 104 05/29/2020   CALCIUM 9.3 05/29/2020   MG 1.8 08/14/2013    Bone No results found for: VD25OH, VD125OH2TOT, ZY6063KZ6, WF0932TF5, 25OHVITD1, 25OHVITD2, 25OHVITD3, TESTOFREE, TESTOSTERONE  Inflammation (CRP: Acute Phase) (ESR: Chronic Phase) No results found for: CRP, ESRSEDRATE, LATICACIDVEN       Note: Above Lab results reviewed.   Physical Exam  General appearance: Well nourished, well developed, and well hydrated. In no apparent acute distress Mental status: Alert, oriented x 3 (person, place, & time)       Respiratory: No evidence of acute respiratory distress Eyes: PERLA Vitals: BP (!) 159/83   Pulse 73   Temp (!) 97.2 F (36.2 C) (Temporal)   Resp 15   Ht 5' 11"  (1.803 m)   Wt 182 lb (82.6 kg)   SpO2 100%   BMI 25.38 kg/m  BMI: Estimated body mass index is 25.38 kg/m as calculated from the following:   Height as of this encounter: 5' 11"  (1.803 m).   Weight as of this encounter: 182 lb (82.6 kg). Ideal: Ideal body weight: 75.3 kg (166 lb 0.1 oz) Adjusted ideal body weight: 78.2 kg (172 lb 6.5 oz)  Right buttock lesion that is improving and crusted over.  No drainage noted.  Slightly tender to palpation.  Assessment   Diagnosis  1. Soft tissue infection of lumbar spine        Plan of Care    [name] has a current medication list which includes the following long-term medication(s): colchicine, gabapentin, insulin glargine, insulin lispro, isosorbide dinitrate, lovastatin, metformin, metoprolol succinate, metoprolol succinate, nitroglycerin, omeprazole-sodium bicarbonate, telmisartan, and zolpidem.  Pharmacotherapy  (Medications Ordered): Meds ordered this encounter  Medications   cephALEXin (KEFLEX) 500 MG capsule    Sig: Take 1 capsule (500 mg total) by mouth 4 (four) times daily for 7 days.    Dispense:  28 capsule    Refill:  0    Do not place medication on "Automatic Refill". Fill one day early if pharmacy is closed on scheduled refill date.    Follow-up plan:   Return for Keep sch. appt.     Caudal ESI 04/30/21   Recent Visits Date Type Provider Dept  04/30/21 Procedure visit Gillis Santa, MD [facility]  04/17/21 Office Visit Gillis Santa, MD [facility]  Showing recent visits within past 90 days and meeting all other requirements Today's Visits Date Type Provider Dept  05/19/21 Office Visit Gillis Santa, MD [facility]  Showing today's visits and meeting all other requirements Future Appointments Date Type Provider Dept  06/04/21 Appointment Gillis Santa, MD [facility]  Showing future appointments within next 90 days and meeting all other requirements I discussed the assessment and treatment plan with the patient. The patient was provided an opportunity to ask questions and all were answered. The patient agreed with the plan and demonstrated an understanding of the instructions.  Patient advised to call back or seek an in-person evaluation if the symptoms or condition worsens.  Duration of encounter: 10mnutes.  Note by: BGillis Santa MD Date: 05/19/2021; Time: 2:20 PM

## 2021-05-19 NOTE — Patient Instructions (Signed)
Keflex has been escribed to your pharmacy.

## 2021-06-03 ENCOUNTER — Telehealth: Payer: Self-pay

## 2021-06-03 NOTE — Telephone Encounter (Signed)
Called patient. No answer. LM to call office so we could review this before his appt. 06/04/21

## 2021-06-04 ENCOUNTER — Other Ambulatory Visit: Payer: Self-pay

## 2021-06-04 ENCOUNTER — Ambulatory Visit
Payer: Medicare Other | Attending: Student in an Organized Health Care Education/Training Program | Admitting: Student in an Organized Health Care Education/Training Program

## 2021-06-04 ENCOUNTER — Encounter: Payer: Self-pay | Admitting: Student in an Organized Health Care Education/Training Program

## 2021-06-04 DIAGNOSIS — M5416 Radiculopathy, lumbar region: Secondary | ICD-10-CM | POA: Diagnosis not present

## 2021-06-04 DIAGNOSIS — M48062 Spinal stenosis, lumbar region with neurogenic claudication: Secondary | ICD-10-CM

## 2021-06-04 DIAGNOSIS — G8929 Other chronic pain: Secondary | ICD-10-CM

## 2021-06-04 NOTE — Progress Notes (Signed)
Patient: Edwin Olson  Service Category: E/M  Provider: Gillis Santa, MD  DOB: 1941-06-19  DOS: 06/04/2021  Location: Office  MRN: [id number]  Setting: Ambulatory outpatient  Referring Provider: Idelle Crouch, MD  Type: Established Patient  Specialty: Interventional Pain Management  PCP: Idelle Crouch, MD  Location: Remote location  Delivery: TeleHealth     Virtual Encounter - Pain Management PROVIDER NOTE: Information contained herein reflects review and annotations entered in association with encounter. Interpretation of such information and data should be left to medically-trained personnel. Information provided to patient can be located elsewhere in the medical record under "Patient Instructions". Document created using STT-dictation technology, any transcriptional errors that may result from process are unintentional.    Contact & Pharmacy Preferred: [phone] Home: [phone] (home) Mobile: [phone] (mobile) E-mail: No e-mail address on record  [place], [place] - [address] [place] [address] Phone: [phone] Fax: [phone]  [place], [place] - [address] [place] [address] Phone: [phone] Fax: [phone]   Pre-screening  Mr. Peral offered "in-person" vs "virtual" encounter. Edwin Olson indicated preferring virtual for this encounter.   Reason COVID-19*  Social distancing based on CDC and AMA recommendations.   I contacted Edwin Olson on 06/04/2021 via telephone.      I clearly identified myself as Gillis Santa, MD. I verified that I was speaking with the correct person using two identifiers (Name: Edwin Olson, and date of birth: 10/16/1940).  Consent I sought verbal advanced consent from Edwin Olson for virtual visit interactions. I informed Edwin Olson of possible security and privacy concerns, risks, and limitations associated with providing "not-in-person"  medical evaluation and management services. I also informed Edwin Olson of the availability of "in-person" appointments. Finally, I informed him that there would be a charge for the virtual visit and that Edwin Olson could be  personally, fully or partially, financially responsible for it. Edwin Olson expressed understanding and agreed to proceed.   Historic Elements   Edwin Olson is a 80 y.o. year old, male patient evaluated Edwin Olson after our last contact on 05/19/2021. Edwin Olson  has a past medical history of Arthritis, Coronary artery disease, Diabetes mellitus without complication ([place]), Diverticulitis, Diverticulitis, Dysrhythmia, GERD (gastroesophageal reflux disease), Hypertension, and Mitral valve insufficiency. Edwin Olson also  has a past surgical history that includes cardiac stents ; Back surgery; Cholecystectomy; Colonoscopy with propofol (N/A, 05/10/2015); Cardiac catheterization; and Colonoscopy with propofol (N/A, 07/30/2020). Edwin Olson has a current medication list which includes the following prescription(s): aspirin ec, baclofen, colchicine, diazepam, doxycycline, gabapentin, hydrocodone-acetaminophen, insulin glargine, insulin lispro, isosorbide dinitrate, lovastatin, metformin, metoprolol succinate, metoprolol succinate, nitroglycerin, omeprazole-sodium bicarbonate, telmisartan, and zolpidem. Edwin Olson  reports that Edwin Olson has never smoked. Edwin Olson has never used smokeless tobacco. Edwin Olson reports that Edwin Olson does not drink alcohol and does not use drugs. Edwin Olson is allergic to clonidine, dapagliflozin, glipizide, atorvastatin, crestor [rosuvastatin], doxycycline, fenofibrate, isosorbide, meclizine, percocet [oxycodone-acetaminophen], ultram [tramadol], amlodipine, atenolol, clopidogrel, lisinopril, losartan, metaxalone, penicillins, and plavix [clopidogrel bisulfate].   HPI  Edwin Olson, Edwin Olson is being contacted for a post-procedure assessment.    Post-Procedure Evaluation  Procedure(s):  Procedure:          Anesthesia,  Analgesia, Anxiolysis:  Type: Diagnostic Epidural Steroid Injection #1  Region: Caudal Level: Sacrococcygeal   Laterality: Midline aiming at the right  Anesthesia: Local (1-2% Lidocaine)  Anxiolysis: None  Sedation: None  Guidance: Fluoroscopy  Position: Prone   Indications: 1. Chronic radicular lumbar pain   2. Lumbar radiculopathy (Right L4/5)   3. Spinal stenosis, lumbar region, with neurogenic claudication   4. Chronic pain syndrome    Pain Score: Pre-procedure: 5 /10 Post-procedure: 0-No pain/10       Anxiolysis: Please see nurses note.  Effectiveness during initial hour after procedure (Ultra-Short Term Relief): 80 %   Local anesthetic used: Long-acting (4-6 hours) Effectiveness: Defined as any analgesic benefit obtained secondary to the administration of local anesthetics. This carries significant diagnostic value as to the etiological location, or anatomical origin, of the pain. Duration of benefit is expected to coincide with the duration of the local anesthetic used.  Effectiveness during initial 4-6 hours after procedure (Short-Term Relief): 80 %   Long-term benefit: Defined as any relief past the pharmacologic duration of the local anesthetics.  Effectiveness past the initial 6 hours after procedure (Long-Term Relief): 80 % (for about 8-9 days then gradually returned)   Benefits, current: Defined as benefit present at the time of this evaluation.   Analgesia:  30-35%, pain returning, patient would like to repeat caudal #2 Function: Somewhat improved ROM: Somewhat improved   Laboratory Chemistry Profile   Renal Lab Results  Component Value Date   BUN 13 05/29/2020   CREATININE 1.36 (H) 05/29/2020   GFRAA 55 (L) 02/24/2020   GFRNONAA 54 (L) 05/29/2020    Hepatic Lab Results  Component Value Date   AST 36 12/25/2014   ALT 35 12/25/2014   ALBUMIN 4.1 12/25/2014   ALKPHOS 73 12/25/2014   LIPASE 36 12/21/2014    Electrolytes Lab Results   Component Value Date   NA 139 05/29/2020   K 4.1 05/29/2020   CL 104 05/29/2020   CALCIUM 9.3 05/29/2020   MG 1.8 08/14/2013    Bone No results found for: VD25OH, VD125OH2TOT, [id number], GB1517OH6, 25OHVITD1, 25OHVITD2, 25OHVITD3, TESTOFREE, TESTOSTERONE  Inflammation (CRP: Acute Phase) (ESR: Chronic Phase) No results found for: CRP, ESRSEDRATE, LATICACIDVEN       Note: Above Lab results reviewed.   Assessment  The primary encounter diagnosis was Chronic radicular lumbar pain. Diagnoses of Spinal stenosis, lumbar region, with neurogenic claudication and Lumbar radiculopathy (Right L4/5) were also pertinent to this visit.  Plan of Care    Edwin Olson has a current medication list which includes the following long-term medication(s): colchicine, gabapentin, insulin glargine, insulin lispro, isosorbide dinitrate, lovastatin, metformin, metoprolol succinate, metoprolol succinate, nitroglycerin, omeprazole-sodium bicarbonate, telmisartan, and zolpidem.   REPEAT Caudal ESI #2, increase volume  Orders:  Orders Placed This Encounter  Procedures   Caudal Epidural Injection    Standing Status:   Future    Standing Expiration Date:   07/05/2021    Scheduling Instructions:     Laterality: Midline     Level(s): Sacrococcygeal canal (Tailbone area)     Sedation: without     Scheduling Timeframe: As soon as pre-approved    Order Specific Question:   Where will this procedure be performed?    Answer:   [facility]    Follow-up plan:   Return in about 1 week (around 06/11/2021) for Caudal #2, without sedation.     Caudal ESI 04/30/21    Recent Visits Date Type Provider Dept  05/19/21 Office Visit Gillis Santa, MD [facility]  04/30/21 Procedure visit Gillis Santa, MD [facility]  04/17/21 Office Visit Gillis Santa, MD [facility]  Showing recent visits within past 90 days and  meeting all other requirements Edwin Olson's Visits Date Type  Provider Dept  06/04/21 Office Visit Gillis Santa, MD [facility]  Showing Edwin Olson's visits and meeting all other requirements Future Appointments No visits were found meeting these conditions. Showing future appointments within next 90 days and meeting all other requirements I discussed the assessment and treatment plan with the patient. The patient was provided an opportunity to ask questions and all were answered. The patient agreed with the plan and demonstrated an understanding of the instructions.  Patient advised to call back or seek an in-person evaluation if the symptoms or condition worsens.  Duration of encounter: 82mnutes.  Note by: BGillis Santa MD Date: 06/04/2021; Time: 2:06 PM

## 2021-06-04 NOTE — Patient Instructions (Signed)

## 2021-06-16 ENCOUNTER — Ambulatory Visit
Payer: Medicare Other | Attending: Student in an Organized Health Care Education/Training Program | Admitting: Student in an Organized Health Care Education/Training Program

## 2021-06-16 ENCOUNTER — Other Ambulatory Visit: Payer: Self-pay

## 2021-06-16 ENCOUNTER — Encounter: Payer: Self-pay | Admitting: Student in an Organized Health Care Education/Training Program

## 2021-06-16 DIAGNOSIS — M48062 Spinal stenosis, lumbar region with neurogenic claudication: Secondary | ICD-10-CM

## 2021-06-16 DIAGNOSIS — G8929 Other chronic pain: Secondary | ICD-10-CM

## 2021-06-16 DIAGNOSIS — M5416 Radiculopathy, lumbar region: Secondary | ICD-10-CM

## 2021-06-16 NOTE — Progress Notes (Signed)
Safety precautions to be maintained throughout the outpatient stay will include: orient to surroundings, keep bed in low position, maintain call bell within reach at all times, provide assistance with transfer out of bed and ambulation.  

## 2021-06-16 NOTE — Progress Notes (Signed)
Caudal ESI deferred today given minimal pain as well as right buttock scab that is in close proximity to follow-up injection site.  Patient is a diabetic.  I inspected this area.  It is scabbed over and slightly erythematous however it is not tender to touch and the patient states that it is healing.  He was prescribed antibiotics after his last caudal epidural steroid injection when this appeared he states that he took 9 days worth and discontinued given GI side effects.  Patient would let us know when he wants to repeat LESI after his scab is healed.

## 2021-06-17 ENCOUNTER — Telehealth: Payer: Self-pay

## 2021-06-17 NOTE — Telephone Encounter (Signed)
Post procedure phone call.   No answer.  

## 2021-07-28 IMAGING — CR DG CHEST 2V
1 series · 2 of 2 positions shown · non-contrast
Comparison: May 10, 2020.

CLINICAL DATA: Chest pain.

EXAM:
CHEST - 2 VIEW

[Series 1: dg chest 2 view · 0.14mm/px · 2 of 2 slices shown]
[im 1/2]
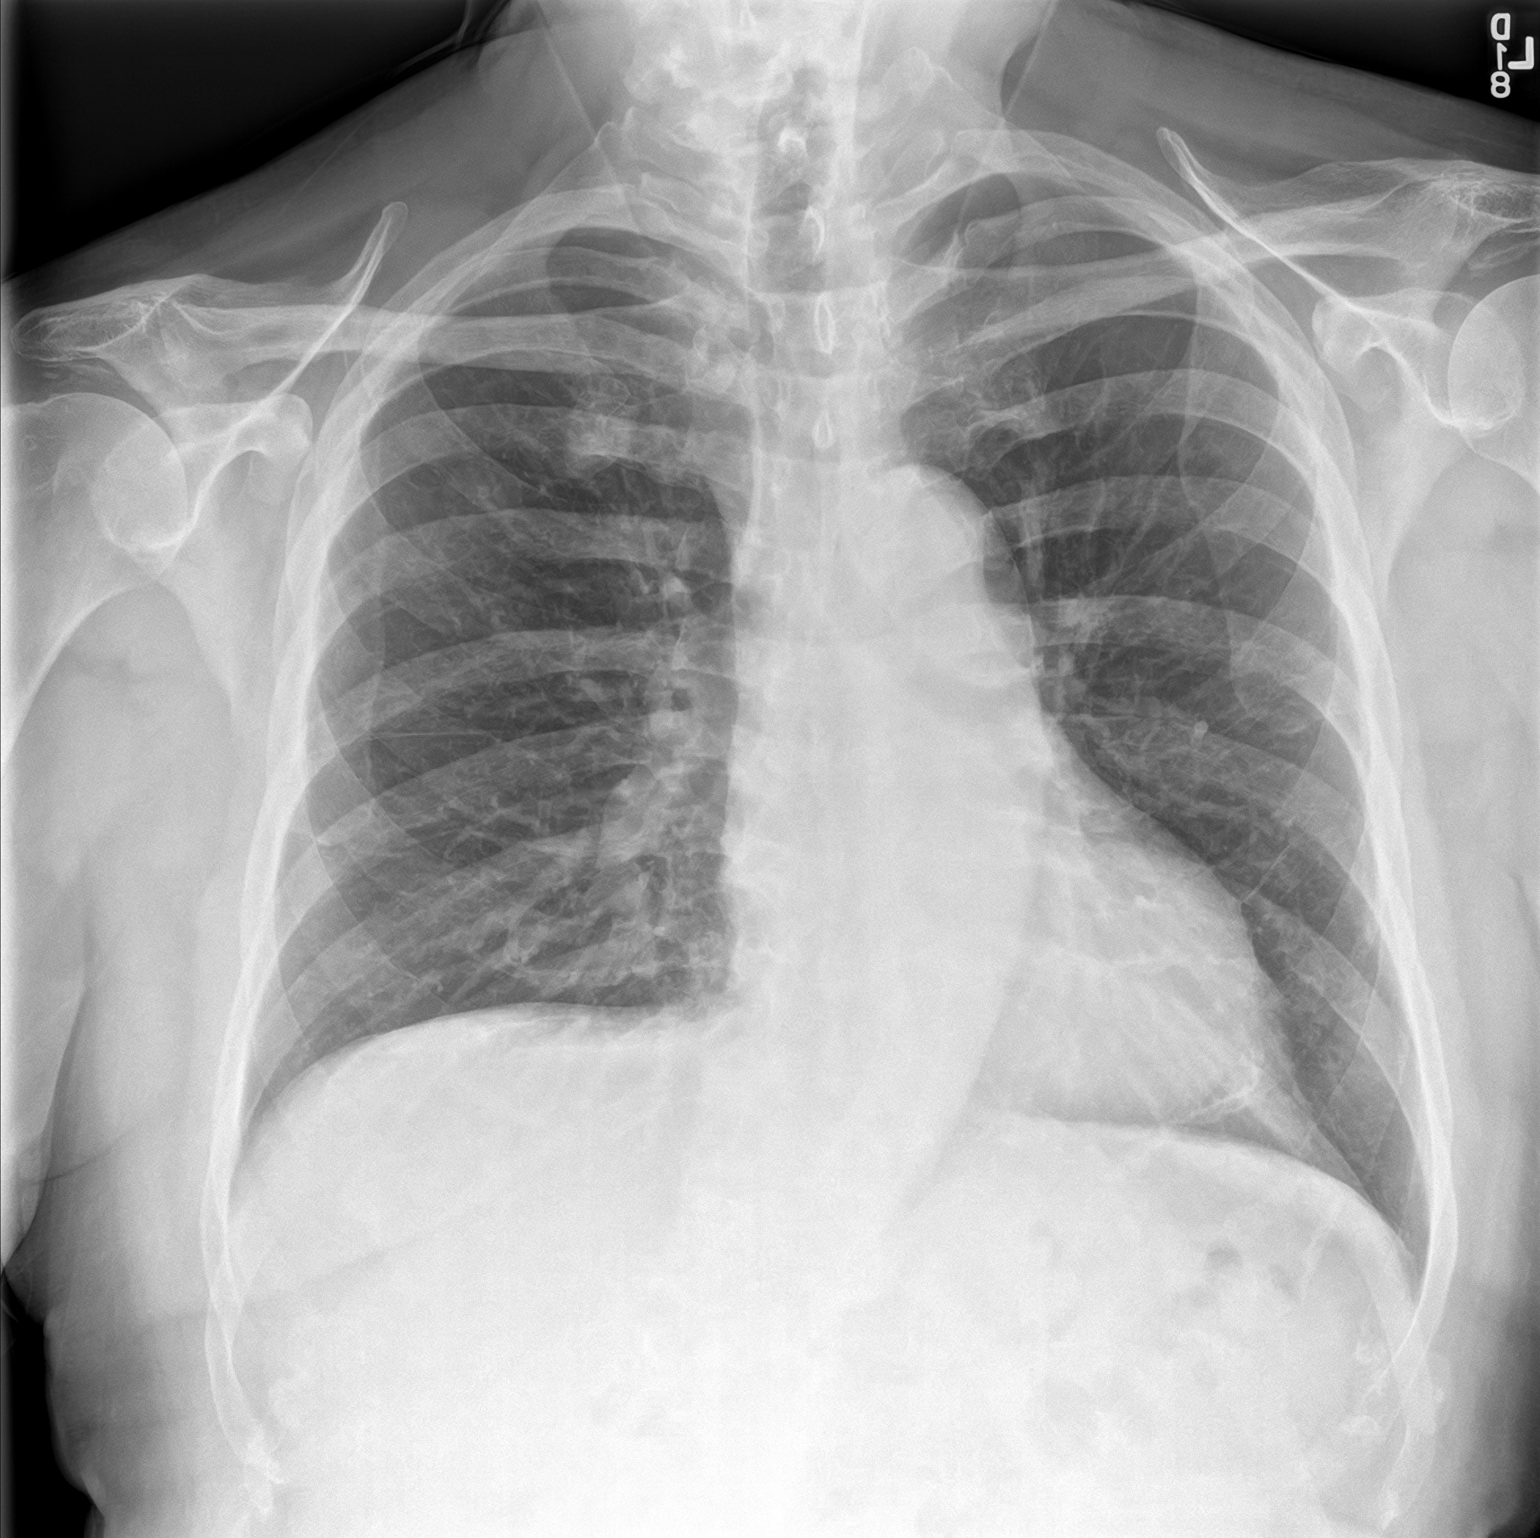
[im 2/2]
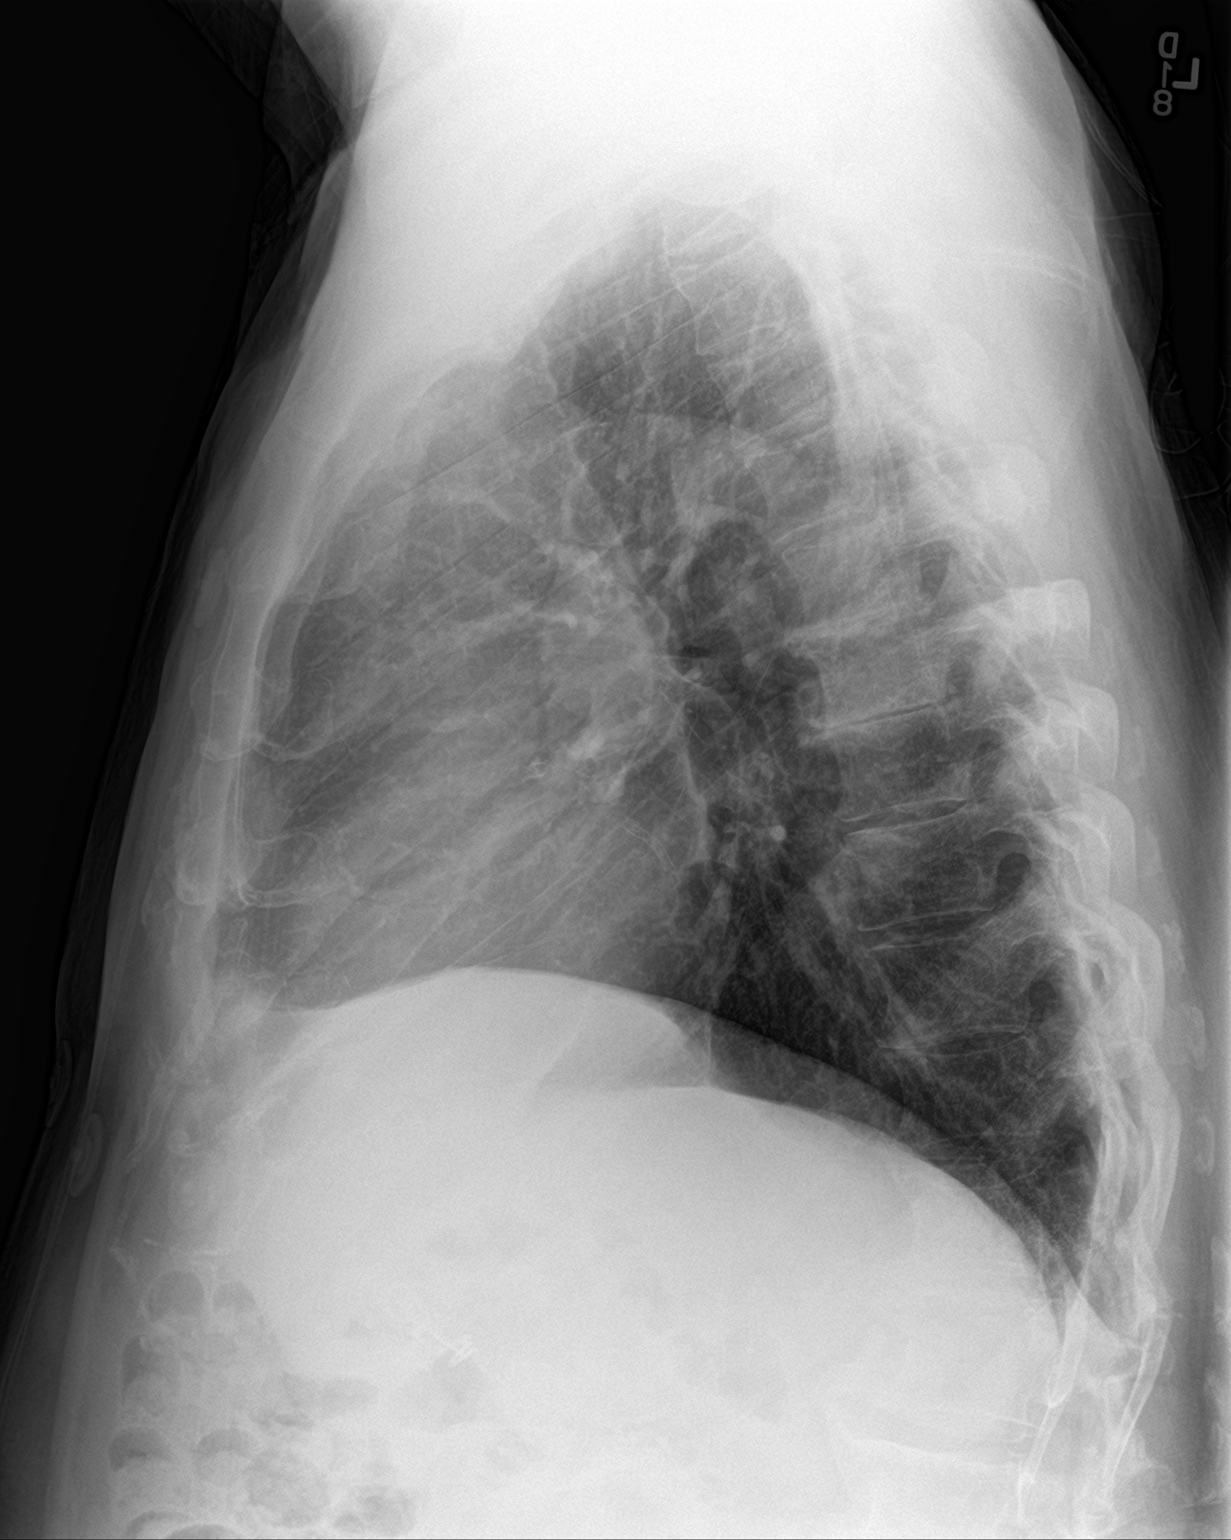

[2 of 2 positions shown; findings below may reference images not displayed]

FINDINGS: The heart size and mediastinal contours are within normal limits.
Both lungs are clear. No visible pleural effusions or pneumothorax.
No acute osseous abnormality.
IMPRESSION: No active cardiopulmonary disease.

## 2021-10-13 ENCOUNTER — Emergency Department: Payer: Medicare Other

## 2021-10-13 ENCOUNTER — Other Ambulatory Visit: Payer: Self-pay

## 2021-10-13 ENCOUNTER — Emergency Department
Admission: EM | Admit: 2021-10-13 | Discharge: 2021-10-13 | Disposition: A | Discharge status: Home / Self Care | Payer: Medicare Other | Attending: Emergency Medicine | Admitting: Emergency Medicine

## 2021-10-13 ENCOUNTER — Encounter: Payer: Self-pay | Admitting: Intensive Care

## 2021-10-13 DIAGNOSIS — E119 Type 2 diabetes mellitus without complications: Secondary | ICD-10-CM | POA: Diagnosis not present

## 2021-10-13 DIAGNOSIS — R519 Headache, unspecified: Secondary | ICD-10-CM | POA: Diagnosis not present

## 2021-10-13 DIAGNOSIS — I129 Hypertensive chronic kidney disease with stage 1 through stage 4 chronic kidney disease, or unspecified chronic kidney disease: Secondary | ICD-10-CM | POA: Insufficient documentation

## 2021-10-13 DIAGNOSIS — N189 Chronic kidney disease, unspecified: Secondary | ICD-10-CM | POA: Insufficient documentation

## 2021-10-13 DIAGNOSIS — G500 Trigeminal neuralgia: Secondary | ICD-10-CM

## 2021-10-13 DIAGNOSIS — I2510 Atherosclerotic heart disease of native coronary artery without angina pectoris: Secondary | ICD-10-CM | POA: Diagnosis not present

## 2021-10-13 DIAGNOSIS — M5412 Radiculopathy, cervical region: Secondary | ICD-10-CM | POA: Diagnosis not present

## 2021-10-13 DIAGNOSIS — M542 Cervicalgia: Secondary | ICD-10-CM | POA: Diagnosis present

## 2021-10-13 LAB — CBC
HCT: 42.2 % (ref 39.0–52.0)
Hemoglobin: 13.6 g/dL (ref 13.0–17.0)
MCH: 29.1 pg (ref 26.0–34.0)
MCHC: 32.2 g/dL (ref 30.0–36.0)
MCV: 90.4 fL (ref 80.0–100.0)
Platelets: 267 10*3/uL (ref 150–400)
RBC: 4.67 MIL/uL (ref 4.22–5.81)
RDW: 13.5 % (ref 11.5–15.5)
WBC: 8.1 10*3/uL (ref 4.0–10.5)
nRBC: 0 % (ref 0.0–0.2)

## 2021-10-13 LAB — TROPONIN I (HIGH SENSITIVITY): Troponin I (High Sensitivity): 11 ng/L (ref <18)

## 2021-10-13 LAB — BASIC METABOLIC PANEL
Anion gap: 9 (ref 5–15)
BUN: 14 mg/dL (ref 8–23)
CO2: 25 mmol/L (ref 22–32)
Calcium: 9.1 mg/dL (ref 8.9–10.3)
Chloride: 103 mmol/L (ref 98–111)
Creatinine, Ser: 1.33 mg/dL — ABNORMAL HIGH (ref 0.61–1.24)
GFR, Estimated: 54 mL/min — ABNORMAL LOW (ref >60)
Glucose, Bld: 202 mg/dL — ABNORMAL HIGH (ref 70–99)
Potassium: 4.1 mmol/L (ref 3.5–5.1)
Sodium: 137 mmol/L (ref 135–145)

## 2021-10-13 MED ORDER — DEXAMETHASONE SODIUM PHOSPHATE 10 MG/ML IJ SOLN
10.0000 mg | Freq: Once | INTRAMUSCULAR | Status: AC
  Administered 2021-10-13: 10 mg via INTRAVENOUS
  Filled 2021-10-13: qty 1

## 2021-10-13 MED ORDER — DICLOFENAC SODIUM 1 % EX GEL: 4.0000 g | Freq: Three times a day (TID) | CUTANEOUS | 0 refills | Status: AC

## 2021-10-13 MED ORDER — ACETAMINOPHEN 500 MG PO TABS
1000.0000 mg | ORAL_TABLET | Freq: Once | ORAL | Status: AC
  Administered 2021-10-13: 1000 mg via ORAL
  Filled 2021-10-13: qty 2

## 2021-10-13 MED ORDER — IOHEXOL 300 MG/ML  SOLN
100.0000 mL | Freq: Once | INTRAMUSCULAR | Status: AC | PRN
  Administered 2021-10-13: 100 mL via INTRAVENOUS

## 2021-10-13 MED ORDER — KETOROLAC TROMETHAMINE 30 MG/ML IJ SOLN
15.0000 mg | Freq: Once | INTRAMUSCULAR | Status: AC
  Administered 2021-10-13: 15 mg via INTRAVENOUS
  Filled 2021-10-13: qty 1

## 2021-10-13 NOTE — ED Provider Notes (Signed)
? ?[facility] ?Provider Note ? ? ? Event Date/Time  ? First MD Initiated Contact with Patient 10/13/21 1257   ?  (approximate) ? ? ?History  ? ?Neck Pain and Headache ? ? ?HPI ? ?Edwin Olson is a 81 y.o. male with past medical history of coronary disease, hypertension, hyperlipidemia, A-fib, here with left-sided facial/neck pain.  The patient states that today, he developed fairly cute onset of sharp, stabbing, left facial pain and upper neck pain.  He states he has a history of trigeminal neuralgia with similar face pain, but states that he also has some pain in his upper neck that radiates down towards his lower neck.  Does not go to his upper extremity or chest.  Denies any lower extremity symptoms.  Of note, he does state that he fell approximately week ago and hit this area.  Has had some mild sensitivity since then.  Denies any weakness or numbness.  No other complaints. ?  ? ? ?Physical Exam  ? ?Triage Vital Signs: ?ED Triage Vitals  ?Enc Vitals Group  ?   BP 10/13/21 1131 132/89  ?   Pulse Rate 10/13/21 1131 75  ?   Resp 10/13/21 1131 16  ?   Temp 10/13/21 1131 98.1 ?F (36.7 ?C)  ?   Temp Source 10/13/21 1131 Oral  ?   SpO2 10/13/21 1131 96 %  ?   Weight 10/13/21 1132 182 lb (82.6 kg)  ?   Height 10/13/21 1132 '5\' 11"'$  (1.803 m)  ?   Head Circumference --   ?   Peak Flow --   ?   Pain Score 10/13/21 1131 10  ?   Pain Loc --   ?   Pain Edu? --   ?   Excl. in [place]? --   ? ? ?Most recent vital signs: ?Vitals:  ? 10/13/21 1500 10/13/21 1530  ?BP: (!) 161/88 (!) 158/76  ?Pulse: 72 78  ?Resp: 19 17  ?Temp:  98.4 ?F (36.9 ?C)  ?SpO2: 100% 99%  ? ? ? ?General: Awake, no distress.  ?CV:  Good peripheral perfusion.  ?Resp:  Normal effort.  ?Abd:  No distention.  ?Other:  Moderate tenderness to palpation over the left upper paracervical spine as well as the postauricular space and trigeminal nerve.  Patient has reproduction of sharp, shooting pain with palpation of the parotid.  Strength 5 out of  5 bilateral upper and lower extremities.  Fully intact grip strength bilaterally.  Normal sensation to light touch and pinprick bilateral upper and lower extremities. ? ?No facial swelling.  Tympanic membrane is normal.  No oropharyngeal swelling or edema.  Uvula midline. ? ? ?ED Results / Procedures / Treatments  ? ?Labs ?(all labs ordered are listed, but only abnormal results are displayed) ?Labs Reviewed  ?BASIC METABOLIC PANEL - Abnormal; Notable for the following components:  ?    Result Value  ? Glucose, Bld 202 (*)   ? Creatinine, Ser 1.33 (*)   ? GFR, Estimated 54 (*)   ? All other components within normal limits  ?CBC  ?TROPONIN I (HIGH SENSITIVITY)  ?TROPONIN I (HIGH SENSITIVITY)  ? ? ? ?EKG ?Normal sinus rhythm, ventricular rate 72.  PR 180, QRS 112, QTc 438.  No acute ST elevations or depressions.  Nonspecific T wave changes. ? ? ?RADIOLOGY ?CT head: No acute intracranial normality ?CT C-spine/neck: Cervical spondylolysis, fairly significant, diffusely.  No apparent oropharyngeal swelling or infection.  No masses. ? ? ?I  also independently reviewed and agree wit radiologist interpretations. ? ? ?PROCEDURES: ? ?Critical Care performed: No ? ? ?MEDICATIONS ORDERED IN ED: ?Medications  ?dexamethasone (DECADRON) injection 10 mg (10 mg Intravenous Given 10/13/21 1401)  ?acetaminophen (TYLENOL) tablet 1,000 mg (1,000 mg Oral Given 10/13/21 1401)  ?ketorolac (TORADOL) 30 MG/ML injection 15 mg (15 mg Intravenous Given 10/13/21 1401)  ?iohexol (OMNIPAQUE) 300 MG/ML solution 100 mL (100 mLs Intravenous Contrast Given 10/13/21 1420)  ? ? ? ?IMPRESSION / MDM / ASSESSMENT AND PLAN / ED COURSE  ?I reviewed the triage vital signs and the nursing notes. ?             ?               ? ? ?The patient is on the cardiac monitor to evaluate for evidence of arrhythmia and/or significant heart rate changes. ? ? ?Ddx:  ?Trigeminal neuralgia, cervical radiculopathy, dental infection, sinusitis, cluster headache/primary headache  syndrome ? ? ?MDM:  ?81 year old male with history of coronary disease, hypertension, hyperlipidemia, trigeminal neuralgia, here with left-sided facial pain, neck pain.  Clinically, my primary suspicion is possible cervical radiculopathy versus exacerbation of his trigeminal neuralgia, though distribution does extend to his neck area though this could be just referred pain.  Cranial nerves II through XII fully intact.  Patient has no evidence of abnormality on ear or oropharyngeal exam.  CT C-spine shows significant cervical radiculopathy which fits with the symptoms, otherwise is unremarkable.  No evidence of mass or lesion in the area.  Otherwise, patient is very well-appearing.  EKG is nonischemic and troponin negative, do not suspect referred cardiac pain.  CBC and BMP unremarkable.  No leukocytosis.  Renal function is at baseline. ? ?Given patient's history of trigeminal neuralgia and cervical radiculopathy with symptoms consistent with this, reproducible on exam, will treat for possible exacerbation in the setting of his recent fall.  No signs of acute cord compression.  No signs of stroke.  Patient given a dose of Decadron and Toradol.  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  Will hold on additional steroids given his diabetes.  He will follow-up with his PCP if symptoms not improved. ? ? ?MEDICATIONS GIVEN IN ED: ?Medications  ?dexamethasone (DECADRON) injection 10 mg (10 mg Intravenous Given 10/13/21 1401)  ?acetaminophen (TYLENOL) tablet 1,000 mg (1,000 mg Oral Given 10/13/21 1401)  ?ketorolac (TORADOL) 30 MG/ML injection 15 mg (15 mg Intravenous Given 10/13/21 1401)  ?iohexol (OMNIPAQUE) 300 MG/ML solution 100 mL (100 mLs Intravenous Contrast Given 10/13/21 1420)  ? ? ? ?Consults:  ?None ? ? ?EMR reviewed  ?Notes from Dr. Nehemiah Massed visit 05/2020, pain clinic notes for radiculopathy ? ? ? ? ?FINAL CLINICAL IMPRESSION(S) / ED DIAGNOSES  ? ?Final diagnoses:  ?Cervical radiculopathy  ?Trigeminal neuralgia   ? ? ? ?Rx / DC Orders  ? ?ED Discharge Orders   ? ?      Ordered  ?  diclofenac Sodium (VOLTAREN) 1 % GEL  3 times daily       ? 10/13/21 1535  ? ?  ?  ? ?  ? ? ? ?Note:  This document was prepared using Dragon voice recognition software and may include unintentional dictation errors. ?  ?Duffy Bruce, MD ?10/13/21 1606 ? ?

## 2021-10-13 NOTE — ED Triage Notes (Addendum)
Patient reports he has been having severe pain in his head, neck, and shoulder. States if he turns his head to the right it gets worse. Also reports pain between shoulder blades. Reports the shoulder pain has been off and on for months but today is the first day the pain travels up to the top of his head. States he tripped over his lawn mower last Wednesday and fell the the ground hitting his head on a door. Denies LOC. Takes aspirin daily.  ?

## 2021-10-13 NOTE — Discharge Instructions (Addendum)
For the next several days, apply the Voltaren gel to your left neck and area of tenderness of the left cheek. ? ?Take your hydrocodone that you have at home for severe pain.  You could also take Tylenol 1000 mg every 6 hours for mild to moderate pain. ? ?Try a heating pad and gentle stretching of the area if pain persist. ? ?If symptoms do not improve within the next several days, call your primary for further follow-up. ?

## 2021-10-13 NOTE — ED Notes (Signed)
Pt and wife give verbal consent to DC ?

## 2021-11-18 ENCOUNTER — Other Ambulatory Visit: Payer: Self-pay | Admitting: Internal Medicine

## 2021-11-18 DIAGNOSIS — R413 Other amnesia: Secondary | ICD-10-CM

## 2021-11-18 DIAGNOSIS — I2510 Atherosclerotic heart disease of native coronary artery without angina pectoris: Secondary | ICD-10-CM

## 2022-03-30 ENCOUNTER — Ambulatory Visit
Admission: RE | Admit: 2022-03-30 | Discharge: 2022-03-30 | Disposition: A | Discharge status: Home / Self Care | Payer: Medicare Other | Attending: Gastroenterology | Admitting: Gastroenterology

## 2022-03-30 ENCOUNTER — Encounter
Admission: RE | Disposition: A | Discharge status: Home / Self Care | Payer: Self-pay | Source: Home / Self Care | Attending: Gastroenterology

## 2022-03-30 ENCOUNTER — Ambulatory Visit: Payer: Medicare Other | Admitting: Anesthesiology

## 2022-03-30 DIAGNOSIS — K5730 Diverticulosis of large intestine without perforation or abscess without bleeding: Secondary | ICD-10-CM | POA: Insufficient documentation

## 2022-03-30 DIAGNOSIS — G8929 Other chronic pain: Secondary | ICD-10-CM | POA: Diagnosis not present

## 2022-03-30 DIAGNOSIS — Z09 Encounter for follow-up examination after completed treatment for conditions other than malignant neoplasm: Secondary | ICD-10-CM | POA: Diagnosis present

## 2022-03-30 DIAGNOSIS — E119 Type 2 diabetes mellitus without complications: Secondary | ICD-10-CM | POA: Diagnosis not present

## 2022-03-30 DIAGNOSIS — I10 Essential (primary) hypertension: Secondary | ICD-10-CM | POA: Insufficient documentation

## 2022-03-30 DIAGNOSIS — D123 Benign neoplasm of transverse colon: Secondary | ICD-10-CM | POA: Diagnosis not present

## 2022-03-30 DIAGNOSIS — Z9049 Acquired absence of other specified parts of digestive tract: Secondary | ICD-10-CM | POA: Diagnosis not present

## 2022-03-30 DIAGNOSIS — I252 Old myocardial infarction: Secondary | ICD-10-CM | POA: Diagnosis not present

## 2022-03-30 DIAGNOSIS — I2510 Atherosclerotic heart disease of native coronary artery without angina pectoris: Secondary | ICD-10-CM | POA: Diagnosis not present

## 2022-03-30 DIAGNOSIS — Z794 Long term (current) use of insulin: Secondary | ICD-10-CM | POA: Insufficient documentation

## 2022-03-30 DIAGNOSIS — Z955 Presence of coronary angioplasty implant and graft: Secondary | ICD-10-CM | POA: Diagnosis not present

## 2022-03-30 HISTORY — PX: COLONOSCOPY WITH PROPOFOL: SHX5780

## 2022-03-30 LAB — GLUCOSE, CAPILLARY: Glucose-Capillary: 104 mg/dL — ABNORMAL HIGH (ref 70–99)

## 2022-03-30 SURGERY — COLONOSCOPY WITH PROPOFOL
Anesthesia: General

## 2022-03-30 MED ORDER — SODIUM CHLORIDE 0.9 % IV SOLN
INTRAVENOUS | Status: DC
  Administered 2022-03-30: 20 mL/h via INTRAVENOUS

## 2022-03-30 MED ORDER — DEXAMETHASONE SODIUM PHOSPHATE 10 MG/ML IJ SOLN
INTRAMUSCULAR | Status: DC | PRN
  Administered 2022-03-30: 5 mg via INTRAVENOUS

## 2022-03-30 MED ORDER — LIDOCAINE HCL (CARDIAC) PF 100 MG/5ML IV SOSY
PREFILLED_SYRINGE | INTRAVENOUS | Status: DC | PRN
  Administered 2022-03-30: 100 mg via INTRAVENOUS

## 2022-03-30 MED ORDER — SUCCINYLCHOLINE CHLORIDE 200 MG/10ML IV SOSY
PREFILLED_SYRINGE | INTRAVENOUS | Status: DC | PRN
  Administered 2022-03-30: 100 mg via INTRAVENOUS

## 2022-03-30 MED ORDER — PROPOFOL 10 MG/ML IV BOLUS
INTRAVENOUS | Status: DC | PRN
  Administered 2022-03-30: 100 mg via INTRAVENOUS

## 2022-03-30 MED ORDER — PROPOFOL 10 MG/ML IV BOLUS
INTRAVENOUS | Status: AC
  Filled 2022-03-30: qty 40

## 2022-03-30 MED ORDER — ONDANSETRON HCL 4 MG/2ML IJ SOLN
INTRAMUSCULAR | Status: DC | PRN
  Administered 2022-03-30: 4 mg via INTRAVENOUS

## 2022-03-30 NOTE — H&P (Signed)
Outpatient short stay form Pre-procedure 03/30/2022  Edwin Rubenstein, MD  Primary Physician: Idelle Crouch, MD  Reason for visit:  Surveillance  History of present illness:    81 y/o gentleman with history of CAD, DM II, and chronic pain here for surveillance colonoscopy. Last colonoscopy a little over a year ago with inadequate prep. History of cholecystectomy. No blood thinners. No family history of GI malignancies.    Current Facility-Administered Medications:    0.9 %  sodium chloride infusion, , Intravenous, Continuous, Raymonde Hamblin, Hilton Cork, MD, Last Rate: 20 mL/hr at 03/30/22 0946, 20 mL/hr at 03/30/22 0946  Medications Prior to Admission  Medication Sig Dispense Refill Last Dose   aspirin EC 81 MG tablet Take 81 mg by mouth daily.   Past Week   baclofen (LIORESAL) 10 MG tablet Take 10 mg by mouth 3 (three) times daily.   03/29/2022   colchicine 0.6 MG tablet Take 0.6 mg by mouth daily as needed.   03/29/2022   diazepam (VALIUM) 5 MG tablet Take 5 mg by mouth at bedtime as needed for anxiety.   03/29/2022   doxycycline (VIBRAMYCIN) 100 MG capsule Take 1 capsule (100 mg total) by mouth 2 (two) times daily. 20 capsule 0 03/29/2022   gabapentin (NEURONTIN) 600 MG tablet Take 600 mg by mouth 2 (two) times daily.    03/29/2022   HYDROcodone-acetaminophen (NORCO/VICODIN) 5-325 MG tablet Take 1 tablet by mouth every 6 (six) hours as needed for moderate pain.   03/29/2022   insulin glargine (LANTUS) 100 UNIT/ML injection Inject 50 Units into the skin daily. 50 units at 2100   03/29/2022   insulin lispro (HUMALOG) 100 UNIT/ML injection Inject 15-22 Units into the skin 2 (two) times daily. 22 units in the morning and 15 units at night   03/29/2022   isosorbide dinitrate (ISORDIL) 30 MG tablet Take 30 mg by mouth 2 (two) times daily.   03/30/2022 at 0730   lovastatin (MEVACOR) 40 MG tablet Take 40 mg by mouth at bedtime.   03/29/2022   metFORMIN (GLUCOPHAGE) 500 MG tablet Take by mouth 2 (two)  times daily with a meal.   03/29/2022   metoprolol succinate (TOPROL-XL) 100 MG 24 hr tablet Take 1 tablet by mouth 2 times daily at 12 noon and 4 pm.   03/30/2022 at 0730   Omeprazole-Sodium Bicarbonate (ZEGERID) 20-1100 MG CAPS capsule Take 1 capsule by mouth daily before breakfast.   03/29/2022   zolpidem (AMBIEN) 5 MG tablet Take by mouth at bedtime as needed for sleep.   03/29/2022   metoprolol succinate (TOPROL-XL) 25 MG 24 hr tablet Take 1 tablet by mouth every evening.      nitroGLYCERIN (NITROSTAT) 0.4 MG SL tablet Place 0.4 mg under the tongue every 5 (five) minutes as needed for chest pain.      telmisartan (MICARDIS) 80 MG tablet Take 1 tablet by mouth every morning.        Allergies  Allergen Reactions   Clonidine Swelling    Pt states turned his mouth white inside and gums and teeth ached.   Dapagliflozin     Other reaction(s): Abdominal Pain   Glipizide Rash and Swelling   Atorvastatin     Other reaction(s): Other (See Comments), Unknown   Crestor [Rosuvastatin] Itching   Doxycycline Other (See Comments)    Other reaction(s): Unknown   Fenofibrate     Other reaction(s): Other (See Comments) Severe indigestion   Isosorbide    Meclizine  Other reaction(s): Unknown   Percocet [Oxycodone-Acetaminophen] Nausea And Vomiting   Ultram [Tramadol] Itching   Amlodipine Rash   Atenolol Rash   Clopidogrel Rash   Lisinopril Rash   Losartan Rash   Metaxalone Rash   Penicillins Rash    Has patient had a PCN reaction causing immediate rash, facial/tongue/throat swelling, SOB or lightheadedness with hypotension: Yes Has patient had a PCN reaction causing severe rash involving mucus membranes or skin necrosis: Yes Has patient had a PCN reaction that required hospitalization Yes Has patient had a PCN reaction occurring within the last 10 years: No If all of the above answers are "NO", then may proceed with Cephalosporin use.    Plavix [Clopidogrel Bisulfate] Itching and Rash      Past Medical History:  Diagnosis Date   Arthritis    Coronary artery disease    Diabetes mellitus without complication ([place])    Diverticulitis    Diverticulitis    Dysrhythmia    PVC's   GERD (gastroesophageal reflux disease)    Hypertension    Mitral valve insufficiency     Review of systems:  Otherwise negative.    Physical Exam  Gen: Alert, oriented. Appears stated age.  HEENT: PERRLA. Lungs: No respiratory distress CV: RRR Abd: soft, benign, no masses Ext: No edema    Planned procedures: Proceed with colonoscopy. The patient understands the nature of the planned procedure, indications, risks, alternatives and potential complications including but not limited to bleeding, infection, perforation, damage to internal organs and possible oversedation/side effects from anesthesia. The patient agrees and gives consent to proceed.  Please refer to procedure notes for findings, recommendations and patient disposition/instructions.     Edwin Rubenstein, MD [facility] Gastroenterology

## 2022-03-30 NOTE — Op Note (Signed)
[facility] Gastroenterology Patient Name: Edwin Olson Procedure Date: 03/30/2022 9:53 AM MRN: [id number] Account #: [id number] Date of Birth: 16-Oct-1940 Admit Type: Outpatient Age: 81 Room: [facility] ENDO ROOM 3 Gender: Male Note Status: Finalized Instrument Name: [id number] Procedure:             Colonoscopy Indications:           Surveillance: History of adenomatous polyps,                         inadequate prep on last exam (<40yr Providers:             CAndrey FarmerMD, MD Referring MD:          JLeonie Douglas SDoy Hutching MD (Referring MD) Medicines:             Monitored Anesthesia Care Complications:         No immediate complications. Estimated blood loss:                         Minimal. Procedure:             Pre-Anesthesia Assessment:                        - Prior to the procedure, a History and Physical was                         performed, and patient medications and allergies were                         reviewed. The patient is competent. The risks and                         benefits of the procedure and the sedation options and                         risks were discussed with the patient. All questions                         were answered and informed consent was obtained.                         Patient identification and proposed procedure were                         verified by the physician, the nurse, the                         anesthesiologist, the anesthetist and the technician                         in the endoscopy suite. Mental Status Examination:                         alert and oriented. Airway Examination: normal                         oropharyngeal airway and neck mobility. Respiratory  Examination: clear to auscultation. CV Examination:                         normal. Prophylactic Antibiotics: The patient does not                         require prophylactic antibiotics. Prior                          Anticoagulants: The patient has taken no previous                         anticoagulant or antiplatelet agents. ASA Grade                         Assessment: III - A patient with severe systemic                         disease. After reviewing the risks and benefits, the                         patient was deemed in satisfactory condition to                         undergo the procedure. The anesthesia plan was to use                         monitored anesthesia care (MAC). Immediately prior to                         administration of medications, the patient was                         re-assessed for adequacy to receive sedatives. The                         heart rate, respiratory rate, oxygen saturations,                         blood pressure, adequacy of pulmonary ventilation, and                         response to care were monitored throughout the                         procedure. The physical status of the patient was                         re-assessed after the procedure.                        After obtaining informed consent, the colonoscope was                         passed under direct vision. Throughout the procedure,                         the patient's blood pressure, pulse, and oxygen  saturations were monitored continuously. The                         Colonoscope was introduced through the anus and                         advanced to the the cecum, identified by appendiceal                         orifice and ileocecal valve. The colonoscopy was                         performed without difficulty. The patient tolerated                         the procedure well. The quality of the bowel                         preparation was adequate to identify polyps. Findings:      The perianal and digital rectal examinations were normal.      A 2 mm polyp was found in the hepatic flexure. The polyp was sessile.       The polyp was removed with a jumbo  cold forceps. Resection and retrieval       were complete. Estimated blood loss was minimal.      Many small and large-mouthed diverticula were found in the sigmoid colon       and descending colon.      The exam was otherwise without abnormality on direct and retroflexion       views. Impression:            - One 2 mm polyp at the hepatic flexure, removed with                         a jumbo cold forceps. Resected and retrieved.                        - Diverticulosis in the sigmoid colon and in the                         descending colon.                        - The examination was otherwise normal on direct and                         retroflexion views. Recommendation:        - Discharge patient to home.                        - Resume previous diet.                        - Continue present medications.                        - Await pathology results.                        - Repeat colonoscopy is not recommended due to current  age (73 years or older) for surveillance.                        - Return to referring physician as previously                         scheduled. Procedure Code(s):     --- Professional ---                        [phone], Colonoscopy, flexible; with biopsy, single or                         multiple Diagnosis Code(s):     --- Professional ---                        Z86.010, Personal history of colonic polyps                        K63.5, Polyp of colon                        K57.30, Diverticulosis of large intestine without                         perforation or abscess without bleeding CPT copyright 2019 [employer]. All rights reserved. The codes documented in this report are preliminary and upon coder review may  be revised to meet current compliance requirements. Andrey Farmer MD, MD 03/30/2022 10:36:40 AM Number of Addenda: 0 Note Initiated On: 03/30/2022 9:53 AM Scope Withdrawal Time: 0 hours 6 minutes 32  seconds  Total Procedure Duration: 0 hours 14 minutes 7 seconds  Estimated Blood Loss:  Estimated blood loss was minimal.      [facility]

## 2022-03-30 NOTE — Anesthesia Procedure Notes (Signed)
Procedure Name: Intubation Date/Time: 03/30/2022 10:13 AM  Performed by: Fredderick Phenix, CRNAPre-anesthesia Checklist: Patient identified, Emergency Drugs available, Suction available and Patient being monitored Patient Re-evaluated:Patient Re-evaluated prior to induction Oxygen Delivery Method: Circle system utilized Preoxygenation: Pre-oxygenation with 100% oxygen Induction Type: IV induction Ventilation: Mask ventilation without difficulty Laryngoscope Size: McGraph and 4 Tube type: Oral Tube size: 7.0 mm Number of attempts: 1 Airway Equipment and Method: Stylet and Oral airway Placement Confirmation: ETT inserted through vocal cords under direct vision, positive ETCO2 and breath sounds checked- equal and bilateral Secured at: 21 cm Tube secured with: Tape Dental Injury: Teeth and Oropharynx as per pre-operative assessment

## 2022-03-30 NOTE — Transfer of Care (Signed)
Immediate Anesthesia Transfer of Care Note  Patient: Edwin Olson [facility]  Procedure(s) Performed: COLONOSCOPY WITH PROPOFOL  Patient Location: PACU  Anesthesia Type:General  Level of Consciousness: awake and alert   Airway & Oxygen Therapy: Patient Spontanous Breathing and Patient connected to face mask oxygen  Post-op Assessment: Report given to RN and Post -op Vital signs reviewed and stable  Post vital signs: Reviewed and stable  Last Vitals:  Vitals Value Taken Time  BP 151/89 03/30/22 1046  Temp 35.7 C 03/30/22 1046  Pulse 95 03/30/22 1046  Resp 16 03/30/22 1046  SpO2 100 % 03/30/22 1046    Last Pain:  Vitals:   03/30/22 1046  TempSrc: Temporal  PainSc:          Complications: No notable events documented.

## 2022-03-30 NOTE — Anesthesia Preprocedure Evaluation (Signed)
Anesthesia Evaluation  Patient identified by MRN, date of birth, ID band Patient awake    Reviewed: Allergy & Precautions, NPO status , Patient's Chart, lab work & pertinent test results  Airway Mallampati: II  TM Distance: >3 FB Neck ROM: full    Dental  (+) Teeth Intact, Caps,    Pulmonary neg pulmonary ROS,    Pulmonary exam normal breath sounds clear to auscultation       Cardiovascular Exercise Tolerance: Good hypertension, Pt. on medications + CAD, + Past MI and + Cardiac Stents  negative cardio ROS Normal cardiovascular exam+ dysrhythmias  Rhythm:Regular Rate:Normal     Neuro/Psych negative neurological ROS  negative psych ROS   GI/Hepatic negative GI ROS, Neg liver ROS, GERD  Medicated,  Endo/Other  negative endocrine ROSdiabetes, Well Controlled, Type 1, Insulin Dependent  Renal/GU      Musculoskeletal   Abdominal Normal abdominal exam  (+)   Peds negative pediatric ROS (+)  Hematology negative hematology ROS (+)   Anesthesia Other Findings Past Medical History: No date: Arthritis No date: Coronary artery disease No date: Diabetes mellitus without complication ([facility]) No date: Diverticulitis No date: Diverticulitis No date: Dysrhythmia     Comment:  PVC's No date: GERD (gastroesophageal reflux disease) No date: Hypertension No date: Mitral valve insufficiency  Past Surgical History: No date: BACK SURGERY No date: CARDIAC CATHETERIZATION No date: cardiac stents      Comment:  cardiac stents  No date: CHOLECYSTECTOMY 05/10/2015: COLONOSCOPY WITH PROPOFOL; N/A     Comment:  Procedure: COLONOSCOPY WITH PROPOFOL;  Surgeon: Hulen Luster, MD;  Location: [facility] ENDOSCOPY;  Service:               Gastroenterology;  Laterality: N/A; 07/30/2020: COLONOSCOPY WITH PROPOFOL; N/A     Comment:  Procedure: COLONOSCOPY WITH PROPOFOL;  Surgeon:               [name], MD;  Location: [facility]  ENDOSCOPY;                Service: Endoscopy;  Laterality: N/A;  BMI    Body Mass Index: 23.71 kg/m      Reproductive/Obstetrics negative OB ROS                             Anesthesia Physical Anesthesia Plan  ASA: 3  Anesthesia Plan: General   Post-op Pain Management:    Induction: Intravenous  PONV Risk Score and Plan: Ondansetron, Dexamethasone, Midazolam and Treatment may vary due to age or medical condition  Airway Management Planned: Natural Airway  Additional Equipment:   Intra-op Plan:   Post-operative Plan: Extubation in OR  Informed Consent: I have reviewed the patients History and Physical, chart, labs and discussed the procedure including the risks, benefits and alternatives for the proposed anesthesia with the patient or authorized representative who has indicated his/her understanding and acceptance.     Dental Advisory Given  Plan Discussed with: CRNA and Surgeon  Anesthesia Plan Comments:         Anesthesia Quick Evaluation

## 2022-03-30 NOTE — Interval H&P Note (Signed)
History and Physical Interval Note:  03/30/2022 10:06 AM  Edwin Olson  has presented today for surgery, with the diagnosis of hx of adenomatous polpy of colon.  The various methods of treatment have been discussed with the patient and family. After consideration of risks, benefits and other options for treatment, the patient has consented to  Procedure(s): COLONOSCOPY WITH PROPOFOL (N/A) as a surgical intervention.  The patient's history has been reviewed, patient examined, no change in status, stable for surgery.  I have reviewed the patient's chart and labs.  Questions were answered to the patient's satisfaction.     [name]  Ok to proceed with colonoscopy

## 2022-03-30 NOTE — Anesthesia Postprocedure Evaluation (Signed)
Anesthesia Post Note  Patient: Edwin Olson [facility]  Procedure(s) Performed: COLONOSCOPY WITH PROPOFOL  Patient location during evaluation: PACU Anesthesia Type: General Level of consciousness: awake and oriented Pain management: pain level controlled Vital Signs Assessment: post-procedure vital signs reviewed and stable Respiratory status: nonlabored ventilation Cardiovascular status: stable Anesthetic complications: no   No notable events documented.   Last Vitals:  Vitals:   03/30/22 1056 03/30/22 1106  BP: (!) 134/94 (!) 151/78  Pulse: 91   Resp: 19   Temp:    SpO2: 95% 97%    Last Pain:  Vitals:   03/30/22 1046  TempSrc: Temporal  PainSc:                  VAN STAVEREN,Peggy Loge

## 2022-03-31 ENCOUNTER — Encounter: Payer: Self-pay | Admitting: Gastroenterology

## 2022-03-31 LAB — SURGICAL PATHOLOGY

## 2022-04-23 ENCOUNTER — Encounter: Payer: Self-pay | Admitting: Ophthalmology

## 2022-04-27 NOTE — Discharge Instructions (Signed)

## 2022-04-29 ENCOUNTER — Ambulatory Visit: Payer: Medicare Other | Admitting: Anesthesiology

## 2022-04-29 ENCOUNTER — Encounter: Payer: Self-pay | Admitting: Ophthalmology

## 2022-04-29 ENCOUNTER — Other Ambulatory Visit: Payer: Self-pay

## 2022-04-29 ENCOUNTER — Ambulatory Visit
Admission: RE | Admit: 2022-04-29 | Discharge: 2022-04-29 | Disposition: A | Discharge status: Home / Self Care | Payer: Medicare Other | Attending: Ophthalmology | Admitting: Ophthalmology

## 2022-04-29 ENCOUNTER — Encounter
Admission: RE | Disposition: A | Discharge status: Home / Self Care | Payer: Self-pay | Source: Home / Self Care | Attending: Ophthalmology

## 2022-04-29 DIAGNOSIS — H2511 Age-related nuclear cataract, right eye: Secondary | ICD-10-CM | POA: Insufficient documentation

## 2022-04-29 DIAGNOSIS — I25119 Atherosclerotic heart disease of native coronary artery with unspecified angina pectoris: Secondary | ICD-10-CM | POA: Diagnosis not present

## 2022-04-29 DIAGNOSIS — Z7982 Long term (current) use of aspirin: Secondary | ICD-10-CM | POA: Insufficient documentation

## 2022-04-29 DIAGNOSIS — I10 Essential (primary) hypertension: Secondary | ICD-10-CM | POA: Diagnosis not present

## 2022-04-29 DIAGNOSIS — E1136 Type 2 diabetes mellitus with diabetic cataract: Secondary | ICD-10-CM | POA: Insufficient documentation

## 2022-04-29 DIAGNOSIS — K219 Gastro-esophageal reflux disease without esophagitis: Secondary | ICD-10-CM | POA: Insufficient documentation

## 2022-04-29 HISTORY — PX: CATARACT EXTRACTION W/PHACO: SHX586

## 2022-04-29 HISTORY — DX: Torticollis: M43.6

## 2022-04-29 HISTORY — DX: Renal agenesis, unilateral: Q60.0

## 2022-04-29 LAB — GLUCOSE, CAPILLARY
Glucose-Capillary: 111 mg/dL — ABNORMAL HIGH (ref 70–99)
Glucose-Capillary: 125 mg/dL — ABNORMAL HIGH (ref 70–99)

## 2022-04-29 SURGERY — PHACOEMULSIFICATION, CATARACT, WITH IOL INSERTION
Anesthesia: Monitor Anesthesia Care | Site: Eye | Laterality: Right

## 2022-04-29 MED ORDER — LACTATED RINGERS IV SOLN: INTRAVENOUS | Status: DC

## 2022-04-29 MED ORDER — MIDAZOLAM HCL 2 MG/2ML IJ SOLN
INTRAMUSCULAR | Status: DC | PRN
  Administered 2022-04-29: 1 mg via INTRAVENOUS

## 2022-04-29 MED ORDER — SIGHTPATH DOSE#1 NA HYALUR & NA CHOND-NA HYALUR IO KIT
PACK | INTRAOCULAR | Status: DC | PRN
  Administered 2022-04-29: 1 via OPHTHALMIC

## 2022-04-29 MED ORDER — TETRACAINE HCL 0.5 % OP SOLN
1.0000 [drp] | OPHTHALMIC | Status: DC | PRN
  Administered 2022-04-29 (×3): 1 [drp] via OPHTHALMIC

## 2022-04-29 MED ORDER — FENTANYL CITRATE (PF) 100 MCG/2ML IJ SOLN
INTRAMUSCULAR | Status: DC | PRN
  Administered 2022-04-29: 50 ug via INTRAVENOUS

## 2022-04-29 MED ORDER — SIGHTPATH DOSE#1 BSS IO SOLN
INTRAOCULAR | Status: DC | PRN
  Administered 2022-04-29: 75 mL via OPHTHALMIC

## 2022-04-29 MED ORDER — ONDANSETRON HCL 4 MG/2ML IJ SOLN: 4.0000 mg | Freq: Once | INTRAMUSCULAR | Status: DC | PRN

## 2022-04-29 MED ORDER — MOXIFLOXACIN HCL 0.5 % OP SOLN
OPHTHALMIC | Status: DC | PRN
  Administered 2022-04-29: 0.2 mL via OPHTHALMIC

## 2022-04-29 MED ORDER — FENTANYL CITRATE PF 50 MCG/ML IJ SOSY: 25.0000 ug | PREFILLED_SYRINGE | INTRAMUSCULAR | Status: DC | PRN

## 2022-04-29 MED ORDER — SIGHTPATH DOSE#1 BSS IO SOLN
INTRAOCULAR | Status: DC | PRN
  Administered 2022-04-29: 15 mL

## 2022-04-29 MED ORDER — BRIMONIDINE TARTRATE-TIMOLOL 0.2-0.5 % OP SOLN
OPHTHALMIC | Status: DC | PRN
  Administered 2022-04-29: 1 [drp] via OPHTHALMIC

## 2022-04-29 MED ORDER — ARMC OPHTHALMIC DILATING DROPS
1.0000 | OPHTHALMIC | Status: DC | PRN
  Administered 2022-04-29 (×3): 1 via OPHTHALMIC

## 2022-04-29 MED ORDER — SIGHTPATH DOSE#1 BSS IO SOLN
INTRAOCULAR | Status: DC | PRN
  Administered 2022-04-29: 1 mL via INTRAMUSCULAR

## 2022-04-29 SURGICAL SUPPLY — 10 items
CATARACT SUITE SIGHTPATH (MISCELLANEOUS) ×1 IMPLANT
FEE CATARACT SUITE SIGHTPATH (MISCELLANEOUS) ×1 IMPLANT
GLOVE SRG 8 PF TXTR STRL LF DI (GLOVE) ×1 IMPLANT
GLOVE SURG ENC TEXT LTX SZ7.5 (GLOVE) ×1 IMPLANT
GLOVE SURG UNDER POLY LF SZ8 (GLOVE) ×1
LENS IOL TECNIS EYHANCE 19.0 (Intraocular Lens) IMPLANT
NDL FILTER BLUNT 18X1 1/2 (NEEDLE) ×1 IMPLANT
NEEDLE FILTER BLUNT 18X1 1/2 (NEEDLE) ×1 IMPLANT
SYR 3ML LL SCALE MARK (SYRINGE) ×1 IMPLANT
WATER STERILE IRR 250ML POUR (IV SOLUTION) ×1 IMPLANT

## 2022-04-29 NOTE — Op Note (Signed)
LOCATION:  [place]   PREOPERATIVE DIAGNOSIS:    Nuclear sclerotic cataract right eye. H25.11   POSTOPERATIVE DIAGNOSIS:  Nuclear sclerotic cataract right eye.     PROCEDURE:  Phacoemusification with posterior chamber intraocular lens placement of the right eye   ULTRASOUND TIME: Procedure(s) with comments: CATARACT EXTRACTION PHACO AND INTRAOCULAR LENS PLACEMENT (IOC) RIGHT DIABETIC 10.86 01:31.1 (Right) - diabetic  LENS:   Implant Name Type Inv. Item Serial No. Manufacturer Lot No. LRB No. Used Action  LENS IOL TECNIS EYHANCE 19.0 - [id number] Intraocular Lens LENS IOL TECNIS EYHANCE 19.0 [id number] [employer]  Right 1 Implanted         SURGEON:  Wyonia Hough, MD   ANESTHESIA:  Topical with tetracaine drops and 2% Xylocaine jelly, augmented with 1% preservative-free intracameral lidocaine.    COMPLICATIONS:  None.   DESCRIPTION OF PROCEDURE:  The patient was identified in the holding room and transported to the operating room and placed in the supine position under the operating microscope.  The right eye was identified as the operative eye and it was prepped and draped in the usual sterile ophthalmic fashion.   A 1 millimeter clear-corneal paracentesis was made at the 12:00 position.  0.5 ml of preservative-free 1% lidocaine was injected into the anterior chamber. The anterior chamber was filled with Viscoat viscoelastic.  A 2.4 millimeter keratome was used to make a near-clear corneal incision at the 9:00 position.  A curvilinear capsulorrhexis was made with a cystotome and capsulorrhexis forceps.  Balanced salt solution was used to hydrodissect and hydrodelineate the nucleus.   Phacoemulsification was then used in stop and chop fashion to remove the lens nucleus and epinucleus.  The remaining cortex was then removed using the irrigation and aspiration handpiece. Provisc was then placed into the capsular bag to distend it for lens placement.  A lens was then  injected into the capsular bag.  The remaining viscoelastic was aspirated.   Wounds were hydrated with balanced salt solution.  The anterior chamber was inflated to a physiologic pressure with balanced salt solution.  No wound leaks were noted. Vigamox 0.2 ml of a '1mg'$  per ml solution was injected into the anterior chamber for a dose of 0.2 mg of intracameral antibiotic at the completion of the case.   Timolol and Brimonidine drops were applied to the eye.  The patient was taken to the recovery room in stable condition without complications of anesthesia or surgery.   Lileigh Fahringer 04/29/2022, 7:54 AM

## 2022-04-29 NOTE — Transfer of Care (Signed)
Immediate Anesthesia Transfer of Care Note  Patient: Edwin Olson [facility]  Procedure(s) Performed: CATARACT EXTRACTION PHACO AND INTRAOCULAR LENS PLACEMENT (IOC) RIGHT DIABETIC 10.86 01:31.1 (Right: Eye)  Patient Location: PACU  Anesthesia Type: MAC  Level of Consciousness: awake, alert  and patient cooperative  Airway and Oxygen Therapy: Patient Spontanous Breathing and Patient connected to supplemental oxygen  Post-op Assessment: Post-op Vital signs reviewed, Patient's Cardiovascular Status Stable, Respiratory Function Stable, Patent Airway and No signs of Nausea or vomiting  Post-op Vital Signs: Reviewed and stable  Complications: No notable events documented.

## 2022-04-29 NOTE — Anesthesia Postprocedure Evaluation (Signed)
Anesthesia Post Note  Patient: Edwin Olson [facility]  Procedure(s) Performed: CATARACT EXTRACTION PHACO AND INTRAOCULAR LENS PLACEMENT (IOC) RIGHT DIABETIC 10.86 01:31.1 (Right: Eye)  Patient location during evaluation: PACU Anesthesia Type: MAC Level of consciousness: awake and alert Pain management: pain level controlled Vital Signs Assessment: post-procedure vital signs reviewed and stable Respiratory status: spontaneous breathing, nonlabored ventilation, respiratory function stable and patient connected to nasal cannula oxygen Cardiovascular status: stable and blood pressure returned to baseline Postop Assessment: no apparent nausea or vomiting Anesthetic complications: no   No notable events documented.   Last Vitals:  Vitals:   04/29/22 0800 04/29/22 0804  BP: 126/84 136/88  Pulse: 90 91  Resp: 19 17  Temp:    SpO2: 97% 96%    Last Pain:  Vitals:   04/29/22 0804  TempSrc:   PainSc: 0-No pain                 Molli Barrows

## 2022-04-29 NOTE — H&P (Signed)
[facility]   Primary Care Physician:  Idelle Crouch, MD Ophthalmologist: Dr. Leandrew Koyanagi  Pre-Procedure History & Physical: HPI:  Edwin Olson is a 81 y.o. male here for ophthalmic surgery.   Past Medical History:  Diagnosis Date   Arthritis    back   Congenital single kidney    only left kidney   Coronary artery disease    Diabetes mellitus without complication (HCC)    Diverticulitis    Diverticulitis    Dysrhythmia    PVC's   GERD (gastroesophageal reflux disease)    Hypertension    Mitral valve insufficiency    Stiff neck    Limited up and down motion due to arthritis    Past Surgical History:  Procedure Laterality Date   BACK SURGERY     CARDIAC CATHETERIZATION  2015   cardiac stents      cardiac stents    CHOLECYSTECTOMY     COLONOSCOPY WITH PROPOFOL N/A 05/10/2015   Procedure: COLONOSCOPY WITH PROPOFOL;  Surgeon: Hulen Luster, MD;  Location: [facility] ENDOSCOPY;  Service: Gastroenterology;  Laterality: N/A;   COLONOSCOPY WITH PROPOFOL N/A 07/30/2020   Procedure: COLONOSCOPY WITH PROPOFOL;  Surgeon: [name], MD;  Location: [facility] ENDOSCOPY;  Service: Endoscopy;  Laterality: N/A;   COLONOSCOPY WITH PROPOFOL N/A 03/30/2022   Procedure: COLONOSCOPY WITH PROPOFOL;  Surgeon: [name], MD;  Location: [facility] ENDOSCOPY;  Service: Endoscopy;  Laterality: N/A;    Prior to Admission medications   Medication Sig Start Date End Date Taking? Authorizing Provider  allopurinol (ZYLOPRIM) 100 MG tablet Take 100 mg by mouth daily.   Yes [provider]  aspirin EC 81 MG tablet Take 81 mg by mouth daily.   Yes [provider]  azelastine (ASTELIN) 0.1 % nasal spray Place into both nostrils 2 (two) times daily as needed for rhinitis. Use in each nostril as directed   Yes [provider]  colchicine 0.6 MG tablet Take 0.6 mg by mouth daily as needed.   Yes [provider]  diazepam (VALIUM) 5 MG tablet Take 5 mg  by mouth at bedtime as needed for anxiety.   Yes [provider]  DULoxetine (CYMBALTA) 30 MG capsule Take 30 mg by mouth at bedtime.   Yes [provider]  EPINEPHrine 0.3 mg/0.3 mL IJ SOAJ injection Inject 0.3 mg into the muscle as needed for anaphylaxis.   Yes [provider]  ezetimibe (ZETIA) 10 MG tablet Take 10 mg by mouth daily.   Yes [provider]  gabapentin (NEURONTIN) 600 MG tablet Take 600 mg by mouth 2 (two) times daily.    Yes [provider]  HYDROcodone-acetaminophen (NORCO/VICODIN) 5-325 MG tablet Take 1 tablet by mouth every 6 (six) hours as needed for moderate pain.   Yes [provider]  insulin glargine (LANTUS) 100 UNIT/ML injection Inject 30 Units into the skin daily. 50 units at 2100   Yes [provider]  isosorbide dinitrate (ISORDIL) 30 MG tablet Take 30 mg by mouth 2 (two) times daily.   Yes [provider]  metFORMIN (GLUCOPHAGE) 500 MG tablet Take 500 mg by mouth 3 (three) times daily.   Yes [provider]  metoprolol succinate (TOPROL-XL) 100 MG 24 hr tablet Take 1 tablet by mouth 2 times daily at 12 noon and 4 pm. 11/04/15  Yes [provider]  nitroGLYCERIN (NITROSTAT) 0.4 MG SL tablet Place 0.4 mg under the tongue every 5 (five) minutes as  needed for chest pain.   Yes [provider]  Omeprazole-Sodium Bicarbonate (ZEGERID) 20-1100 MG CAPS capsule Take 1 capsule by mouth daily before breakfast.   Yes [provider]  QUEtiapine (SEROQUEL) 25 MG tablet Take 25 mg by mouth at bedtime.   Yes [provider]  Semaglutide (OZEMPIC, 0.25 OR 0.5 MG/DOSE, [place]) Inject 0.25 mg into the skin once a week. Friday   Yes [provider]  telmisartan (MICARDIS) 80 MG tablet Take 1 tablet by mouth every morning. 01/06/16 04/29/22 Yes [provider]  zolpidem (AMBIEN) 5 MG tablet Take by mouth at bedtime as needed for sleep.   Yes [provider]  insulin lispro (HUMALOG) 100 UNIT/ML injection Inject 15-22 Units into the skin 2 (two) times daily. 22 units in the morning and 15 units at night Patient not taking: Reported on 04/23/2022    [provider]    Allergies as of 03/23/2022 - Review Complete 10/13/2021  Allergen Reaction Noted   Clonidine Swelling 04/23/2016   Dapagliflozin  05/07/2020   Glipizide Rash and Swelling 08/14/2013   Atorvastatin  02/15/2014   Crestor [rosuvastatin] Itching 02/26/2016   Doxycycline Other (See Comments) 08/14/2013   Fenofibrate  07/05/2019   Isosorbide  01/28/2016   Meclizine  08/14/2013   Percocet [oxycodone-acetaminophen] Nausea And Vomiting 12/26/2014   Ultram [tramadol] Itching 12/26/2014   Amlodipine Rash 08/22/2015   Atenolol Rash 12/26/2014   Clopidogrel Rash 12/21/2014   Lisinopril Rash 02/16/2020   Losartan Rash 04/23/2016   Metaxalone Rash 02/15/2014   Penicillins Rash 12/21/2014   Plavix [clopidogrel bisulfate] Itching and Rash 12/21/2014    History reviewed. No pertinent family history.  Social History   Socioeconomic History   Marital status: Married    Spouse name: Not on file   Number of children: Not on file   Years of education: Not on file   Highest education level: Not on file  Occupational History   Not on file  Tobacco Use   Smoking status: Never   Smokeless tobacco: Never  Vaping Use   Vaping Use: Never used  Substance and Sexual Activity   Alcohol use: No   Drug use: No   Sexual activity: Not on file  Other Topics Concern   Not on file  Social History Narrative   Not on file   Social Determinants of Health   Financial Resource Strain: Not on file  Food Insecurity: Not on file  Transportation Needs: Not on file  Physical Activity: Not on file  Stress: Not on file  Social Connections: Not on file  Intimate Partner Violence: Not on file    Review of Systems: See HPI, otherwise negative ROS  Physical Exam: BP (!) 170/84    Pulse 86   Temp 97.7 F (36.5 C) (Temporal)   Ht '5\' 11"'$  (1.803 m)   Wt 76.7 kg   SpO2 99%   BMI 23.57 kg/m  General:   Alert,  pleasant and cooperative in NAD Head:  Normocephalic and atraumatic. Lungs:  Clear to auscultation.    Heart:  Regular rate and rhythm.   Impression/Plan: Edwin Olson is here for ophthalmic surgery.  Risks, benefits, limitations, and alternatives regarding ophthalmic surgery have been reviewed with the patient.  Questions have been answered.  All parties agreeable.   Leandrew Koyanagi, MD  04/29/2022, 7:33 AM

## 2022-04-29 NOTE — Anesthesia Preprocedure Evaluation (Signed)
Anesthesia Evaluation  Patient identified by MRN, date of birth, ID band Patient awake    Reviewed: Allergy & Precautions, H&P , NPO status , Patient's Chart, lab work & pertinent test results, reviewed documented beta blocker date and time   Airway Mallampati: II  TM Distance: >3 FB Neck ROM: full    Dental no notable dental hx. (+) Teeth Intact   Pulmonary neg pulmonary ROS,    Pulmonary exam normal breath sounds clear to auscultation       Cardiovascular Exercise Tolerance: Poor hypertension, On Medications + angina with exertion + CAD  negative cardio ROS  + dysrhythmias  Rhythm:regular Rate:Normal     Neuro/Psych negative neurological ROS  negative psych ROS   GI/Hepatic negative GI ROS, Neg liver ROS, GERD  Medicated,  Endo/Other  negative endocrine ROSdiabetes  Renal/GU CRFRenal disease     Musculoskeletal   Abdominal   Peds  Hematology negative hematology ROS (+)   Anesthesia Other Findings   Reproductive/Obstetrics negative OB ROS                             Anesthesia Physical Anesthesia Plan  ASA: 3  Anesthesia Plan: MAC   Post-op Pain Management:    Induction:   PONV Risk Score and Plan:   Airway Management Planned:   Additional Equipment:   Intra-op Plan:   Post-operative Plan:   Informed Consent: I have reviewed the patients History and Physical, chart, labs and discussed the procedure including the risks, benefits and alternatives for the proposed anesthesia with the patient or authorized representative who has indicated his/her understanding and acceptance.       Plan Discussed with: CRNA  Anesthesia Plan Comments:         Anesthesia Quick Evaluation

## 2022-04-30 ENCOUNTER — Encounter: Payer: Self-pay | Admitting: Ophthalmology

## 2022-05-12 NOTE — Discharge Instructions (Signed)

## 2022-05-13 ENCOUNTER — Ambulatory Visit: Payer: Medicare Other | Admitting: Anesthesiology

## 2022-05-13 ENCOUNTER — Ambulatory Visit
Admission: RE | Admit: 2022-05-13 | Discharge: 2022-05-13 | Disposition: A | Discharge status: Home / Self Care | Payer: Medicare Other | Attending: Ophthalmology | Admitting: Ophthalmology

## 2022-05-13 ENCOUNTER — Other Ambulatory Visit: Payer: Self-pay

## 2022-05-13 ENCOUNTER — Encounter
Admission: RE | Disposition: A | Discharge status: Home / Self Care | Payer: Self-pay | Source: Home / Self Care | Attending: Ophthalmology

## 2022-05-13 ENCOUNTER — Encounter: Payer: Self-pay | Admitting: Ophthalmology

## 2022-05-13 DIAGNOSIS — I2510 Atherosclerotic heart disease of native coronary artery without angina pectoris: Secondary | ICD-10-CM | POA: Insufficient documentation

## 2022-05-13 DIAGNOSIS — I10 Essential (primary) hypertension: Secondary | ICD-10-CM | POA: Diagnosis not present

## 2022-05-13 DIAGNOSIS — I340 Nonrheumatic mitral (valve) insufficiency: Secondary | ICD-10-CM | POA: Diagnosis not present

## 2022-05-13 DIAGNOSIS — K219 Gastro-esophageal reflux disease without esophagitis: Secondary | ICD-10-CM | POA: Insufficient documentation

## 2022-05-13 DIAGNOSIS — Z955 Presence of coronary angioplasty implant and graft: Secondary | ICD-10-CM | POA: Insufficient documentation

## 2022-05-13 DIAGNOSIS — Z7982 Long term (current) use of aspirin: Secondary | ICD-10-CM | POA: Diagnosis not present

## 2022-05-13 DIAGNOSIS — H2512 Age-related nuclear cataract, left eye: Secondary | ICD-10-CM | POA: Insufficient documentation

## 2022-05-13 DIAGNOSIS — E1136 Type 2 diabetes mellitus with diabetic cataract: Secondary | ICD-10-CM | POA: Diagnosis present

## 2022-05-13 HISTORY — PX: CATARACT EXTRACTION W/PHACO: SHX586

## 2022-05-13 LAB — GLUCOSE, CAPILLARY: Glucose-Capillary: 102 mg/dL — ABNORMAL HIGH (ref 70–99)

## 2022-05-13 SURGERY — PHACOEMULSIFICATION, CATARACT, WITH IOL INSERTION
Anesthesia: Monitor Anesthesia Care | Site: Eye | Laterality: Left

## 2022-05-13 MED ORDER — MOXIFLOXACIN HCL 0.5 % OP SOLN
OPHTHALMIC | Status: DC | PRN
  Administered 2022-05-13: .2 mL via OPHTHALMIC

## 2022-05-13 MED ORDER — SIGHTPATH DOSE#1 BSS IO SOLN
INTRAOCULAR | Status: DC | PRN
  Administered 2022-05-13: 69 mL via OPHTHALMIC

## 2022-05-13 MED ORDER — SIGHTPATH DOSE#1 BSS IO SOLN
INTRAOCULAR | Status: DC | PRN
  Administered 2022-05-13: 15 mL

## 2022-05-13 MED ORDER — ARMC OPHTHALMIC DILATING DROPS
1.0000 | OPHTHALMIC | Status: DC | PRN
  Administered 2022-05-13 (×3): 1 via OPHTHALMIC

## 2022-05-13 MED ORDER — FENTANYL CITRATE (PF) 100 MCG/2ML IJ SOLN
INTRAMUSCULAR | Status: DC | PRN
  Administered 2022-05-13: 100 ug via INTRAVENOUS

## 2022-05-13 MED ORDER — SIGHTPATH DOSE#1 BSS IO SOLN
INTRAOCULAR | Status: DC | PRN
  Administered 2022-05-13: 1 mL

## 2022-05-13 MED ORDER — BRIMONIDINE TARTRATE-TIMOLOL 0.2-0.5 % OP SOLN
OPHTHALMIC | Status: DC | PRN
  Administered 2022-05-13: 1 [drp] via OPHTHALMIC

## 2022-05-13 MED ORDER — TETRACAINE HCL 0.5 % OP SOLN
1.0000 [drp] | OPHTHALMIC | Status: DC | PRN
  Administered 2022-05-13 (×3): 1 [drp] via OPHTHALMIC

## 2022-05-13 MED ORDER — MIDAZOLAM HCL 2 MG/2ML IJ SOLN
INTRAMUSCULAR | Status: DC | PRN
  Administered 2022-05-13 (×2): 1 mg via INTRAVENOUS

## 2022-05-13 MED ORDER — SIGHTPATH DOSE#1 NA HYALUR & NA CHOND-NA HYALUR IO KIT
PACK | INTRAOCULAR | Status: DC | PRN
  Administered 2022-05-13: 1 via OPHTHALMIC

## 2022-05-13 SURGICAL SUPPLY — 10 items
CATARACT SUITE SIGHTPATH (MISCELLANEOUS) ×1 IMPLANT
FEE CATARACT SUITE SIGHTPATH (MISCELLANEOUS) ×1 IMPLANT
GLOVE SRG 8 PF TXTR STRL LF DI (GLOVE) ×1 IMPLANT
GLOVE SURG ENC TEXT LTX SZ7.5 (GLOVE) ×1 IMPLANT
GLOVE SURG UNDER POLY LF SZ8 (GLOVE) ×1
LENS IOL TECNIS EYHANCE 18.5 (Intraocular Lens) IMPLANT
NDL FILTER BLUNT 18X1 1/2 (NEEDLE) ×1 IMPLANT
NEEDLE FILTER BLUNT 18X1 1/2 (NEEDLE) ×1 IMPLANT
SYR 3ML LL SCALE MARK (SYRINGE) ×1 IMPLANT
WATER STERILE IRR 250ML POUR (IV SOLUTION) ×1 IMPLANT

## 2022-05-13 NOTE — Op Note (Signed)
OPERATIVE NOTE  Edwin Olson [id number] 05/13/2022   PREOPERATIVE DIAGNOSIS:  Nuclear sclerotic cataract left eye. H25.12   POSTOPERATIVE DIAGNOSIS:    Nuclear sclerotic cataract left eye.     PROCEDURE:  Phacoemusification with posterior chamber intraocular lens placement of the left eye  Ultrasound time: Procedure(s) with comments: CATARACT EXTRACTION PHACO AND INTRAOCULAR LENS PLACEMENT (IOC) LEFT DIABETIC (Left) - 11.30 1:21.8  LENS:   Implant Name Type Inv. Item Serial No. Manufacturer Lot No. LRB No. Used Action  LENS IOL TECNIS EYHANCE 18.5 - [id number] Intraocular Lens LENS IOL TECNIS EYHANCE 18.5 [id number] [employer]  Left 1 Implanted      SURGEON:  Wyonia Hough, MD   ANESTHESIA:  Topical with tetracaine drops and 2% Xylocaine jelly, augmented with 1% preservative-free intracameral lidocaine.    COMPLICATIONS:  None.   DESCRIPTION OF PROCEDURE:  The patient was identified in the holding room and transported to the operating room and placed in the supine position under the operating microscope.  The left eye was identified as the operative eye and it was prepped and draped in the usual sterile ophthalmic fashion.   A 1 millimeter clear-corneal paracentesis was made at the 1:30 position.  0.5 ml of preservative-free 1% lidocaine was injected into the anterior chamber.  The anterior chamber was filled with Viscoat viscoelastic.  A 2.4 millimeter keratome was used to make a near-clear corneal incision at the 10:30 position.  .  A curvilinear capsulorrhexis was made with a cystotome and capsulorrhexis forceps.  Balanced salt solution was used to hydrodissect and hydrodelineate the nucleus.   Phacoemulsification was then used in stop and chop fashion to remove the lens nucleus and epinucleus.  The remaining cortex was then removed using the irrigation and aspiration handpiece. Provisc was then placed into the capsular bag to distend it for lens placement.  A lens was  then injected into the capsular bag.  The remaining viscoelastic was aspirated.   Wounds were hydrated with balanced salt solution.  The anterior chamber was inflated to a physiologic pressure with balanced salt solution.  No wound leaks were noted. Vigamox 0.2 ml of a '1mg'$  per ml solution was injected into the anterior chamber for a dose of 0.2 mg of intracameral antibiotic at the completion of the case.   Timolol and Brimonidine drops were applied to the eye.  The patient was taken to the recovery room in stable condition without complications of anesthesia or surgery.  [name] 05/13/2022, 11:16 AM

## 2022-05-13 NOTE — Anesthesia Postprocedure Evaluation (Signed)
Anesthesia Post Note  Patient: Foster Frericks [facility]  Procedure(s) Performed: CATARACT EXTRACTION PHACO AND INTRAOCULAR LENS PLACEMENT (IOC) LEFT DIABETIC (Left: Eye)  Patient location during evaluation: PACU Anesthesia Type: MAC Level of consciousness: awake and alert Pain management: pain level controlled Vital Signs Assessment: post-procedure vital signs reviewed and stable Respiratory status: spontaneous breathing, nonlabored ventilation, respiratory function stable and patient connected to nasal cannula oxygen Cardiovascular status: stable and blood pressure returned to baseline Postop Assessment: no apparent nausea or vomiting Anesthetic complications: no   No notable events documented.   Last Vitals:  Vitals:   05/13/22 0933 05/13/22 1123  BP: (!) 160/91 120/82  Pulse: 88 93  Resp: 18 16  Temp: 37 C (!) 36.4 C  SpO2: 96% 94%    Last Pain:  Vitals:   05/13/22 0933  TempSrc: Temporal  PainSc: 0-No pain                 Ilene Qua

## 2022-05-13 NOTE — Anesthesia Preprocedure Evaluation (Signed)
Anesthesia Evaluation  Patient identified by MRN, date of birth, ID band Patient awake    Reviewed: Allergy & Precautions, H&P , NPO status , Patient's Chart, lab work & pertinent test results, reviewed documented beta blocker date and time   Airway Mallampati: II  TM Distance: >3 FB Neck ROM: full    Dental no notable dental hx. (+) Teeth Intact   Pulmonary neg pulmonary ROS   Pulmonary exam normal breath sounds clear to auscultation       Cardiovascular Exercise Tolerance: Poor hypertension, On Medications + angina with exertion + CAD  negative cardio ROS Normal cardiovascular exam+ dysrhythmias  Rhythm:regular Rate:Normal     Neuro/Psych negative neurological ROS  negative psych ROS   GI/Hepatic negative GI ROS, Neg liver ROS,GERD  Medicated,,  Endo/Other  negative endocrine ROSdiabetes    Renal/GU CRFRenal disease     Musculoskeletal   Abdominal   Peds  Hematology negative hematology ROS (+)   Anesthesia Other Findings Past Medical History: No date: Arthritis     Comment:  back No date: Congenital single kidney     Comment:  only left kidney No date: Coronary artery disease No date: Diabetes mellitus without complication (HCC) No date: Diverticulitis No date: Diverticulitis No date: Dysrhythmia     Comment:  PVC's No date: GERD (gastroesophageal reflux disease) No date: Hypertension No date: Mitral valve insufficiency No date: Stiff neck     Comment:  Limited up and down motion due to arthritis  Past Surgical History: No date: BACK SURGERY 2015: CARDIAC CATHETERIZATION No date: cardiac stents      Comment:  cardiac stents  04/29/2022: CATARACT EXTRACTION W/PHACO; Right     Comment:  Procedure: CATARACT EXTRACTION PHACO AND INTRAOCULAR               LENS PLACEMENT (IOC) RIGHT DIABETIC 10.86 01:31.1;                Surgeon: Leandrew Koyanagi, MD;  Location: [place];   Service: Ophthalmology;  Laterality:               Right;  diabetic No date: CHOLECYSTECTOMY 05/10/2015: COLONOSCOPY WITH PROPOFOL; N/A     Comment:  Procedure: COLONOSCOPY WITH PROPOFOL;  Surgeon: Hulen Luster, MD;  Location: [facility] ENDOSCOPY;  Service:               Gastroenterology;  Laterality: N/A; 07/30/2020: COLONOSCOPY WITH PROPOFOL; N/A     Comment:  Procedure: COLONOSCOPY WITH PROPOFOL;  Surgeon:               [name], MD;  Location: [facility] ENDOSCOPY;                Service: Endoscopy;  Laterality: N/A; 03/30/2022: COLONOSCOPY WITH PROPOFOL; N/A     Comment:  Procedure: COLONOSCOPY WITH PROPOFOL;  Surgeon:               [name], MD;  Location: [facility] ENDOSCOPY;                Service: Endoscopy;  Laterality: N/A;  BMI    Body Mass Index: 23.57 kg/m      Reproductive/Obstetrics negative OB ROS  Anesthesia Physical Anesthesia Plan  ASA: 3  Anesthesia Plan: MAC   Post-op Pain Management:    Induction:   PONV Risk Score and Plan:   Airway Management Planned:   Additional Equipment:   Intra-op Plan:   Post-operative Plan:   Informed Consent: I have reviewed the patients History and Physical, chart, labs and discussed the procedure including the risks, benefits and alternatives for the proposed anesthesia with the patient or authorized representative who has indicated his/her understanding and acceptance.       Plan Discussed with: CRNA  Anesthesia Plan Comments:          Anesthesia Quick Evaluation

## 2022-05-13 NOTE — Transfer of Care (Signed)
Immediate Anesthesia Transfer of Care Note  Patient: Edwin Olson [facility]  Procedure(s) Performed: CATARACT EXTRACTION PHACO AND INTRAOCULAR LENS PLACEMENT (IOC) LEFT DIABETIC (Left: Eye)  Patient Location: PACU  Anesthesia Type: MAC  Level of Consciousness: awake, alert  and patient cooperative  Airway and Oxygen Therapy: Patient Spontanous Breathing and Patient connected to supplemental oxygen  Post-op Assessment: Post-op Vital signs reviewed, Patient's Cardiovascular Status Stable, Respiratory Function Stable, Patent Airway and No signs of Nausea or vomiting  Post-op Vital Signs: Reviewed and stable  Complications: No notable events documented.

## 2022-05-13 NOTE — H&P (Signed)
[facility]   Primary Care Physician:  Idelle Crouch, MD Ophthalmologist: Dr. Leandrew Koyanagi  Pre-Procedure History & Physical: HPI:  Edwin Olson is a 81 y.o. male here for ophthalmic surgery.   Past Medical History:  Diagnosis Date   Arthritis    back   Congenital single kidney    only left kidney   Coronary artery disease    Diabetes mellitus without complication ([place])    Diverticulitis    Diverticulitis    Dysrhythmia    PVC's   GERD (gastroesophageal reflux disease)    Hypertension    Mitral valve insufficiency    Stiff neck    Limited up and down motion due to arthritis    Past Surgical History:  Procedure Laterality Date   BACK SURGERY     CARDIAC CATHETERIZATION  2015   cardiac stents      cardiac stents    CATARACT EXTRACTION W/PHACO Right 04/29/2022   Procedure: CATARACT EXTRACTION PHACO AND INTRAOCULAR LENS PLACEMENT ([place]) RIGHT DIABETIC 10.86 01:31.1;  Surgeon: Leandrew Koyanagi, MD;  Location: [facility];  Service: Ophthalmology;  Laterality: Right;  diabetic   CHOLECYSTECTOMY     COLONOSCOPY WITH PROPOFOL N/A 05/10/2015   Procedure: COLONOSCOPY WITH PROPOFOL;  Surgeon: Hulen Luster, MD;  Location: [facility] ENDOSCOPY;  Service: Gastroenterology;  Laterality: N/A;   COLONOSCOPY WITH PROPOFOL N/A 07/30/2020   Procedure: COLONOSCOPY WITH PROPOFOL;  Surgeon: [name], MD;  Location: [facility] ENDOSCOPY;  Service: Endoscopy;  Laterality: N/A;   COLONOSCOPY WITH PROPOFOL N/A 03/30/2022   Procedure: COLONOSCOPY WITH PROPOFOL;  Surgeon: [name], MD;  Location: [facility] ENDOSCOPY;  Service: Endoscopy;  Laterality: N/A;    Prior to Admission medications   Medication Sig Start Date End Date Taking? Authorizing Provider  allopurinol (ZYLOPRIM) 100 MG tablet Take 100 mg by mouth daily.    [provider]  aspirin EC 81 MG tablet Take 81 mg by mouth daily.    [provider]  azelastine (ASTELIN) 0.1 % nasal  spray Place into both nostrils 2 (two) times daily as needed for rhinitis. Use in each nostril as directed    [provider]  colchicine 0.6 MG tablet Take 0.6 mg by mouth daily as needed.    [provider]  diazepam (VALIUM) 5 MG tablet Take 5 mg by mouth at bedtime as needed for anxiety.    [provider]  DULoxetine (CYMBALTA) 30 MG capsule Take 30 mg by mouth at bedtime.    [provider]  EPINEPHrine 0.3 mg/0.3 mL IJ SOAJ injection Inject 0.3 mg into the muscle as needed for anaphylaxis.    [provider]  ezetimibe (ZETIA) 10 MG tablet Take 10 mg by mouth daily.    [provider]  gabapentin (NEURONTIN) 600 MG tablet Take 600 mg by mouth 2 (two) times daily.     [provider]  HYDROcodone-acetaminophen (NORCO/VICODIN) 5-325 MG tablet Take 1 tablet by mouth every 6 (six) hours as needed for moderate pain.    [provider]  insulin glargine (LANTUS) 100 UNIT/ML injection Inject 30 Units into the skin daily. 50 units at 2100    [provider]  insulin lispro (HUMALOG) 100 UNIT/ML injection Inject 15-22 Units into the skin 2 (two) times daily. 22 units in the morning and 15 units at night Patient not taking: Reported on 04/23/2022    [provider]  isosorbide dinitrate (ISORDIL) 30 MG tablet Take 30 mg by  mouth 2 (two) times daily.    [provider]  metFORMIN (GLUCOPHAGE) 500 MG tablet Take 500 mg by mouth 3 (three) times daily.    [provider]  metoprolol succinate (TOPROL-XL) 100 MG 24 hr tablet Take 1 tablet by mouth 2 times daily at 12 noon and 4 pm. 11/04/15   [provider]  nitroGLYCERIN (NITROSTAT) 0.4 MG SL tablet Place 0.4 mg under the tongue every 5 (five) minutes as needed for chest pain.    [provider]  Omeprazole-Sodium Bicarbonate (ZEGERID) 20-1100 MG CAPS capsule Take 1 capsule by mouth daily before breakfast.    [provider]   QUEtiapine (SEROQUEL) 25 MG tablet Take 25 mg by mouth at bedtime.    [provider]  Semaglutide (OZEMPIC, 0.25 OR 0.5 MG/DOSE, [place]) Inject 0.25 mg into the skin once a week. Friday    [provider]  telmisartan (MICARDIS) 80 MG tablet Take 1 tablet by mouth every morning. 01/06/16 04/29/22  [provider]  zolpidem (AMBIEN) 5 MG tablet Take by mouth at bedtime as needed for sleep.    [provider]    Allergies as of 03/23/2022 - Review Complete 10/13/2021  Allergen Reaction Noted   Clonidine Swelling 04/23/2016   Dapagliflozin  05/07/2020   Glipizide Rash and Swelling 08/14/2013   Atorvastatin  02/15/2014   Crestor [rosuvastatin] Itching 02/26/2016   Doxycycline Other (See Comments) 08/14/2013   Fenofibrate  07/05/2019   Isosorbide  01/28/2016   Meclizine  08/14/2013   Percocet [oxycodone-acetaminophen] Nausea And Vomiting 12/26/2014   Ultram [tramadol] Itching 12/26/2014   Amlodipine Rash 08/22/2015   Atenolol Rash 12/26/2014   Clopidogrel Rash 12/21/2014   Lisinopril Rash 02/16/2020   Losartan Rash 04/23/2016   Metaxalone Rash 02/15/2014   Penicillins Rash 12/21/2014   Plavix [clopidogrel bisulfate] Itching and Rash 12/21/2014    History reviewed. No pertinent family history.  Social History   Socioeconomic History   Marital status: Married    Spouse name: Not on file   Number of children: Not on file   Years of education: Not on file   Highest education level: Not on file  Occupational History   Not on file  Tobacco Use   Smoking status: Never   Smokeless tobacco: Never  Vaping Use   Vaping Use: Never used  Substance and Sexual Activity   Alcohol use: No   Drug use: No   Sexual activity: Not on file  Other Topics Concern   Not on file  Social History Narrative   Not on file   Social Determinants of Health   Financial Resource Strain: Not on file  Food Insecurity: Not on file  Transportation Needs: Not on file   Physical Activity: Not on file  Stress: Not on file  Social Connections: Not on file  Intimate Partner Violence: Not on file    Review of Systems: See HPI, otherwise negative ROS  Physical Exam: Ht '5\' 11"'$  (1.803 m)   Wt 76.7 kg   BMI 23.57 kg/m  General:   Alert,  pleasant and cooperative in NAD Head:  Normocephalic and atraumatic. Lungs:  Clear to auscultation.    Heart:  Regular rate and rhythm.   Impression/Plan: Edwin Olson is here for ophthalmic surgery.  Risks, benefits, limitations, and alternatives regarding ophthalmic surgery have been reviewed with the patient.  Questions have been answered.  All parties agreeable.   Leandrew Koyanagi, MD  05/13/2022, 9:25 AM

## 2022-05-14 ENCOUNTER — Encounter: Payer: Self-pay | Admitting: Ophthalmology

## 2022-10-08 ENCOUNTER — Emergency Department: Payer: Medicare Other

## 2022-10-08 ENCOUNTER — Other Ambulatory Visit: Payer: Self-pay

## 2022-10-08 ENCOUNTER — Observation Stay
Admission: EM | Admit: 2022-10-08 | Discharge: 2022-10-09 | Disposition: A | Discharge status: Home Health | Payer: Medicare Other | Attending: Internal Medicine | Admitting: Internal Medicine

## 2022-10-08 DIAGNOSIS — S0101XA Laceration without foreign body of scalp, initial encounter: Secondary | ICD-10-CM

## 2022-10-08 DIAGNOSIS — E1142 Type 2 diabetes mellitus with diabetic polyneuropathy: Secondary | ICD-10-CM | POA: Insufficient documentation

## 2022-10-08 DIAGNOSIS — W11XXXA Fall on and from ladder, initial encounter: Secondary | ICD-10-CM | POA: Insufficient documentation

## 2022-10-08 DIAGNOSIS — E1151 Type 2 diabetes mellitus with diabetic peripheral angiopathy without gangrene: Secondary | ICD-10-CM | POA: Insufficient documentation

## 2022-10-08 DIAGNOSIS — I5022 Chronic systolic (congestive) heart failure: Secondary | ICD-10-CM | POA: Insufficient documentation

## 2022-10-08 DIAGNOSIS — Z794 Long term (current) use of insulin: Secondary | ICD-10-CM | POA: Diagnosis not present

## 2022-10-08 DIAGNOSIS — Z9861 Coronary angioplasty status: Secondary | ICD-10-CM | POA: Diagnosis not present

## 2022-10-08 DIAGNOSIS — S065X9A Traumatic subdural hemorrhage with loss of consciousness of unspecified duration, initial encounter: Principal | ICD-10-CM | POA: Insufficient documentation

## 2022-10-08 DIAGNOSIS — R519 Headache, unspecified: Secondary | ICD-10-CM | POA: Diagnosis present

## 2022-10-08 DIAGNOSIS — Z23 Encounter for immunization: Secondary | ICD-10-CM | POA: Diagnosis not present

## 2022-10-08 DIAGNOSIS — Z7982 Long term (current) use of aspirin: Secondary | ICD-10-CM | POA: Insufficient documentation

## 2022-10-08 DIAGNOSIS — Z7984 Long term (current) use of oral hypoglycemic drugs: Secondary | ICD-10-CM | POA: Insufficient documentation

## 2022-10-08 DIAGNOSIS — W19XXXA Unspecified fall, initial encounter: Secondary | ICD-10-CM

## 2022-10-08 DIAGNOSIS — I110 Hypertensive heart disease with heart failure: Secondary | ICD-10-CM | POA: Diagnosis not present

## 2022-10-08 DIAGNOSIS — I2510 Atherosclerotic heart disease of native coronary artery without angina pectoris: Secondary | ICD-10-CM | POA: Diagnosis not present

## 2022-10-08 DIAGNOSIS — S065XAA Traumatic subdural hemorrhage with loss of consciousness status unknown, initial encounter: Secondary | ICD-10-CM

## 2022-10-08 DIAGNOSIS — S060XAA Concussion with loss of consciousness status unknown, initial encounter: Secondary | ICD-10-CM

## 2022-10-08 DIAGNOSIS — S0990XA Unspecified injury of head, initial encounter: Secondary | ICD-10-CM

## 2022-10-08 DIAGNOSIS — I10 Essential (primary) hypertension: Secondary | ICD-10-CM | POA: Insufficient documentation

## 2022-10-08 LAB — CBC WITH DIFFERENTIAL/PLATELET
Abs Immature Granulocytes: 0.05 10*3/uL (ref 0.00–0.07)
Basophils Absolute: 0.1 10*3/uL (ref 0.0–0.1)
Basophils Relative: 1 %
Eosinophils Absolute: 0.2 10*3/uL (ref 0.0–0.5)
Eosinophils Relative: 2 %
HCT: 34.8 % — ABNORMAL LOW (ref 39.0–52.0)
Hemoglobin: 11.7 g/dL — ABNORMAL LOW (ref 13.0–17.0)
Immature Granulocytes: 0 %
Lymphocytes Relative: 16 %
Lymphs Abs: 1.9 10*3/uL (ref 0.7–4.0)
MCH: 30.2 pg (ref 26.0–34.0)
MCHC: 33.6 g/dL (ref 30.0–36.0)
MCV: 89.9 fL (ref 80.0–100.0)
Monocytes Absolute: 1 10*3/uL (ref 0.1–1.0)
Monocytes Relative: 8 %
Neutro Abs: 9.1 10*3/uL — ABNORMAL HIGH (ref 1.7–7.7)
Neutrophils Relative %: 73 %
Platelets: 217 10*3/uL (ref 150–400)
RBC: 3.87 MIL/uL — ABNORMAL LOW (ref 4.22–5.81)
RDW: 13.7 % (ref 11.5–15.5)
WBC: 12.4 10*3/uL — ABNORMAL HIGH (ref 4.0–10.5)
nRBC: 0 % (ref 0.0–0.2)

## 2022-10-08 LAB — BASIC METABOLIC PANEL
Anion gap: 8 (ref 5–15)
BUN: 18 mg/dL (ref 8–23)
CO2: 21 mmol/L — ABNORMAL LOW (ref 22–32)
Calcium: 8.5 mg/dL — ABNORMAL LOW (ref 8.9–10.3)
Chloride: 106 mmol/L (ref 98–111)
Creatinine, Ser: 1.22 mg/dL (ref 0.61–1.24)
GFR, Estimated: 60 mL/min — ABNORMAL LOW (ref >60)
Glucose, Bld: 100 mg/dL — ABNORMAL HIGH (ref 70–99)
Potassium: 3.9 mmol/L (ref 3.5–5.1)
Sodium: 135 mmol/L (ref 135–145)

## 2022-10-08 MED ORDER — LIDOCAINE-EPINEPHRINE (PF) 2 %-1:200000 IJ SOLN
INTRAMUSCULAR | Status: AC
  Filled 2022-10-08: qty 20

## 2022-10-08 MED ORDER — TETANUS-DIPHTH-ACELL PERTUSSIS 5-2.5-18.5 LF-MCG/0.5 IM SUSY
0.5000 mL | PREFILLED_SYRINGE | Freq: Once | INTRAMUSCULAR | Status: AC
  Administered 2022-10-08: 0.5 mL via INTRAMUSCULAR
  Filled 2022-10-08: qty 0.5

## 2022-10-08 MED ORDER — ACETAMINOPHEN 325 MG PO TABS
650.0000 mg | ORAL_TABLET | Freq: Once | ORAL | Status: AC
  Administered 2022-10-08: 650 mg via ORAL
  Filled 2022-10-08: qty 2

## 2022-10-08 MED ORDER — LIDOCAINE 5 % EX PTCH
1.0000 | MEDICATED_PATCH | CUTANEOUS | Status: DC
  Administered 2022-10-08: 1 via TRANSDERMAL
  Filled 2022-10-08: qty 1

## 2022-10-08 MED ORDER — LIDOCAINE-EPINEPHRINE-TETRACAINE (LET) TOPICAL GEL
3.0000 mL | Freq: Once | TOPICAL | Status: AC
  Administered 2022-10-08: 3 mL via TOPICAL
  Filled 2022-10-08: qty 3

## 2022-10-08 MED ORDER — LIDOCAINE-EPINEPHRINE (PF) 2 %-1:200000 IJ SOLN
20.0000 mL | Freq: Once | INTRAMUSCULAR | Status: AC
  Administered 2022-10-08: 20 mL via INTRADERMAL

## 2022-10-08 NOTE — ED Notes (Signed)
Pt right hand and fingers washed and dried. Xeroform and gauze applied to two index fingers and pinky. Pt tolerated well.

## 2022-10-08 NOTE — ED Triage Notes (Signed)
Patient was climbing the attic stairs and fell backwards off the ladder landing on his back and head; He states that he did lose consciousness (pupils equal and reactive, motor strength strong and equal in all extremities, GCS 15); Takes 1 baby ASA daily; Patient does have laceration to his scalp, no other external injuries noted; Reports 6/10 lower back pain; Currently in C-collar due to mechanism of injury

## 2022-10-08 NOTE — ED Notes (Incomplete)
Pt called out stating he wanted to attempt using the urinal. Patient was assisted to the edge of the bed and immediately when sitting up became very dizzy and near syncope. Patient states the same feeling with slight movements of the HOB up/down. MD Derrill Kay notified of these findings and orders followed.

## 2022-10-08 NOTE — ED Provider Notes (Signed)
[facility] Provider Note    Event Date/Time   First MD Initiated Contact with Patient 10/08/22 1518     (approximate)   History   Fall (Patient was climbing the attic stairs and fell backwards off the ladder landing on his back and head; He states that he did lose consciousness (pupils equal and reactive, motor strength strong and equal in all extremities, GCS 15); Takes 1 baby ASA daily; Patient does have laceration to his scalp, no other external injuries noted; Reports 6/10 lower back pain; Currently in C-collar due to mechanism of injury)   HPI  Edwin Olson is a 82 y.o. male who presents today for evaluation after a fall.  Patient reports that he was climbing the attic stairs when he lost his balance, falling backwards off of the ladder landing on his back and head.  Patient believes that he lost consciousness.  He is unsure how long but estimates that it was only for short period of time given that the electrician was still there with him.  Patient reports that he has pain in his head and back.  He also feels pain in his left shoulder but reports he is able to move his shoulder normally.  He has not attempted to bear weight since this happened.  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      Excl. in GC?     Most recent vital signs: Vitals:   10/08/22 2100 10/08/22 2200  BP: 132/83 126/84   Pulse: 85 95  Resp: 15 20  Temp:    SpO2: 94% 97%    Physical Exam Vitals and nursing note reviewed.  Constitutional:      General: Awake and alert. No acute distress.    Appearance: Normal appearance. The patient is normal weight.  HENT:     Head: Normocephalic.  Large laceration with skin flap and hematoma noted to occiput oozing bright red blood.  No Battle sign or raccoon eyes    Mouth: Mucous membranes are moist.  Eyes:     General: PERRL. Normal EOMs        Right eye: No discharge.        Left eye: No discharge.     Conjunctiva/sclera: Conjunctivae normal.  Cardiovascular:     Rate and Rhythm: Normal rate and regular rhythm.     Pulses: Normal pulses.  Pulmonary:     Effort: Pulmonary effort is normal. No respiratory distress.     Breath sounds: Normal breath sounds.  Abdominal:     Abdomen is soft. There is no abdominal tenderness. No  rebound or guarding. No distention. Musculoskeletal:        General: No swelling. Normal range of motion.     Cervical back: Normal range of motion and neck supple.  No midline cervical spine tenderness.  Full range of motion of neck.  Negative Spurling test.  Negative Lhermitte sign.  Normal strength and sensation in bilateral upper extremities. Normal grip strength bilaterally.  Normal intrinsic muscle function of the hand bilaterally.  Normal radial pulses bilaterally. Diffuse tenderness to thoracic and lumbar regions without contusion, hematoma, worse given injuries noted. Skin:    General: Skin is warm and dry.     Capillary Refill: Capillary refill takes less than 2 seconds.  Neurological:     Mental Status: The patient is awake and alert.   Neurological: GCS 15 alert and oriented x3 Normal speech, no expressive or receptive aphasia or dysarthria Cranial nerves II through XII intact Normal visual fields 5 out of 5 strength in all 4 extremities with intact sensation throughout No extremity drift Normal finger-to-nose testing, no  limb or truncal ataxia    ED Results / Procedures / Treatments   Labs (all labs ordered are listed, but only abnormal results are displayed) Labs Reviewed  CBC WITH DIFFERENTIAL/PLATELET - Abnormal; Notable for the following components:      Result Value   WBC 12.4 (*)    RBC 3.87 (*)    Hemoglobin 11.7 (*)    HCT 34.8 (*)    Neutro Abs 9.1 (*)    All other components within normal limits  BASIC METABOLIC PANEL - Abnormal; Notable for the following components:   CO2 21 (*)    Glucose, Bld 100 (*)    Calcium 8.5 (*)    GFR, Estimated 60 (*)    All other components within normal limits     EKG     RADIOLOGY I independently reviewed and interpreted imaging and agree with radiologists findings.     PROCEDURES:  Critical Care performed:   Marland KitchenMarland KitchenLaceration Repair  Date/Time: 10/08/2022 9:13 PM  Performed by: Jackelyn Hoehn, PA-C Authorized by: Jackelyn Hoehn, PA-C   Consent:    Consent obtained:  Verbal   Consent given by:  Patient   Risks, benefits, and alternatives were discussed: yes     Risks discussed:  Need for additional repair, nerve damage, infection, pain, poor cosmetic result, poor wound healing, retained foreign body, tendon damage and vascular damage   Alternatives discussed:  No treatment Universal protocol:    Procedure explained and questions answered to patient or proxy's satisfaction: yes     Relevant documents present and verified: yes     Test results available: yes     Imaging studies available: yes     Required blood products, implants, devices, and special equipment available: yes     Site/side marked: yes     Immediately prior to procedure, a time out was called: yes     Patient identity confirmed:  Verbally with patient Anesthesia:    Anesthesia method:  Topical application and local infiltration   Topical anesthetic:  LET   Local anesthetic:  Lidocaine 2% WITH epi Laceration details:    Location:  Scalp   Scalp location:  Occipital    Length (cm):  13   Depth (mm):  5 Pre-procedure details:    Preparation:  Patient was prepped and draped in usual sterile fashion and imaging obtained to evaluate for foreign bodies Exploration:    Limited defect created (wound extended):  no     Hemostasis achieved with:  Direct pressure, epinephrine and LET   Imaging outcome: foreign body not noted     Wound exploration: wound explored through full range of motion and entire depth of wound visualized     Wound extent: no foreign body and no underlying fracture   Treatment:    Area cleansed with:  Saline and chlorhexidine   Amount of cleaning:  Extensive   Irrigation solution:  Sterile saline   Irrigation method:  Pressure wash and syringe   Debridement:  None   Undermining:  None   Scar revision: no   Skin repair:    Repair method:  Staples   Number of staples:  13 Repair type:    Repair type:  Intermediate Post-procedure details:    Dressing:  Non-adherent dressing   Procedure completion:  Tolerated well, no immediate complications .Critical Care  Performed by: Jackelyn Hoehn, PA-C Authorized by: Jackelyn Hoehn, PA-C   Critical care provider statement:    Critical care time (minutes):  40   Critical care was necessary to treat or prevent imminent or life-threatening deterioration of the following conditions:  Trauma and CNS failure or compromise   Critical care was time spent personally by me on the following activities:  Development of treatment plan with patient or surrogate, discussions with consultants, evaluation of patient's response to treatment, examination of patient, ordering and review of laboratory studies, ordering and review of radiographic studies, ordering and performing treatments and interventions, pulse oximetry, re-evaluation of patient's condition, review of old charts and obtaining history from patient or surrogate   I assumed direction of critical care for this patient from another provider in my specialty:  no     Care discussed with comment:  Dr. Marcell Barlow (neurosurgery), Dr. Marisa Severin and Dr. Derrill Kay (ER attendings)    MEDICATIONS ORDERED IN ED: Medications  lidocaine (LIDODERM) 5 % 1 patch (1 patch Transdermal Patch Applied 10/08/22 1622)  lidocaine-EPINEPHrine-tetracaine (LET) topical gel (3 mLs Topical Given 10/08/22 1548)  Tdap (BOOSTRIX) injection 0.5 mL (0.5 mLs Intramuscular Given 10/08/22 1620)  acetaminophen (TYLENOL) tablet 650 mg (650 mg Oral Given 10/08/22 1729)  lidocaine-EPINEPHrine (XYLOCAINE W/EPI) 2 %-1:200000 (PF) injection 20 mL ( Intradermal Not Given 10/08/22 1848)     IMPRESSION / MDM / ASSESSMENT AND PLAN / ED COURSE  I reviewed the triage vital signs and the nursing notes.   Differential diagnosis includes, but is not limited to, scalp laceration, skull fracture, intracranial hemorrhage, cervical spine injury, shoulder dislocation, humerus fracture, compression fracture, vertebral injury.  Patient is awake and alert, hemodynamically stable and neurologically intact.  He is with his nephew first, and his daughter-in-law second and both agree that he is at his mental baseline.  His wounds were anesthetized first with light and subsequently with lidocaine with epinephrine which helped to stop the bleeding.  His laceration was irrigated with normal saline and chlorhexidine and closed with staples.  We discussed staple care and timeline for removal.  CT scans of his head and neck obtained per [place] criteria.  CT cervical spine, thoracic spine, and lumbar spine are normal.  Patient is reassured by these findings.  He was given an updated tetanus vaccination as he was unsure of his previous.  He was treated symptomatically for his pain.  CT of his thoracic and lumbar spine was obtained given his pain in these locations and his mechanism of injury.  CT head reveals a 4 mm subdural hematoma.  This was  discussed with Dr. Marcell BarlowYarborough who recommends repeat head CT in 6 hours and if  stable then he can be discharged with outpatient follow-up.  Case was discussed first with Dr. Marisa SeverinSiadecki, and then with Dr. Derrill KayGoodman as patient was moved to the main side for observation pending second CT scan.  He was placed on the cardiac monitor.  Patient and for members understand and agree with plan. Patient passedd off to Dr. Derrill KayGoodman pending repeat CT.  Patient's presentation is most consistent with acute presentation with potential threat to life or bodily function.   Clinical Course as of 10/08/22 2255  Thu Oct 08, 2022  1634 Call from rads: 4mm subdural hematoma [JP]  1636 Discussed with Dr. Myer HaffYarbrough, recommends 6 hour repeat CT. Can continue aspirin if head CT stable. Discussed with Dr. Derrill KayGoodman, will move to main side [JP]    Clinical Course User Index [JP] Jabarie Pop, Herb GraysJenna E, PA-C     FINAL CLINICAL IMPRESSION(S) / ED DIAGNOSES   Final diagnoses:  Subdural hematoma  Fall, initial encounter  Injury of head, initial encounter  Laceration of scalp, initial encounter     Rx / DC Orders   ED Discharge Orders     None        Note:  This document was prepared using Dragon voice recognition software and may include unintentional dictation errors.   Keturah Shaversoggi, Haset [place] E, PA-C 10/08/22 2255    Phineas SemenGoodman, [place], MD 10/08/22 [id number]

## 2022-10-09 ENCOUNTER — Telehealth: Payer: Self-pay

## 2022-10-09 DIAGNOSIS — I2510 Atherosclerotic heart disease of native coronary artery without angina pectoris: Secondary | ICD-10-CM

## 2022-10-09 DIAGNOSIS — S065XAA Traumatic subdural hemorrhage with loss of consciousness status unknown, initial encounter: Secondary | ICD-10-CM

## 2022-10-09 DIAGNOSIS — S060X1S Concussion with loss of consciousness of 30 minutes or less, sequela: Secondary | ICD-10-CM

## 2022-10-09 DIAGNOSIS — E1142 Type 2 diabetes mellitus with diabetic polyneuropathy: Secondary | ICD-10-CM | POA: Insufficient documentation

## 2022-10-09 DIAGNOSIS — S065X9A Traumatic subdural hemorrhage with loss of consciousness of unspecified duration, initial encounter: Secondary | ICD-10-CM | POA: Diagnosis not present

## 2022-10-09 DIAGNOSIS — I10 Essential (primary) hypertension: Secondary | ICD-10-CM | POA: Insufficient documentation

## 2022-10-09 DIAGNOSIS — I5022 Chronic systolic (congestive) heart failure: Secondary | ICD-10-CM | POA: Diagnosis not present

## 2022-10-09 DIAGNOSIS — S0101XS Laceration without foreign body of scalp, sequela: Secondary | ICD-10-CM

## 2022-10-09 DIAGNOSIS — W19XXXA Unspecified fall, initial encounter: Secondary | ICD-10-CM | POA: Insufficient documentation

## 2022-10-09 DIAGNOSIS — S060XAA Concussion with loss of consciousness status unknown, initial encounter: Secondary | ICD-10-CM

## 2022-10-09 DIAGNOSIS — S0101XA Laceration without foreign body of scalp, initial encounter: Secondary | ICD-10-CM | POA: Insufficient documentation

## 2022-10-09 LAB — CBC
HCT: 33.9 % — ABNORMAL LOW (ref 39.0–52.0)
Hemoglobin: 11.2 g/dL — ABNORMAL LOW (ref 13.0–17.0)
MCH: 29.9 pg (ref 26.0–34.0)
MCHC: 33 g/dL (ref 30.0–36.0)
MCV: 90.4 fL (ref 80.0–100.0)
Platelets: 211 10*3/uL (ref 150–400)
RBC: 3.75 MIL/uL — ABNORMAL LOW (ref 4.22–5.81)
RDW: 13.9 % (ref 11.5–15.5)
WBC: 9.7 10*3/uL (ref 4.0–10.5)
nRBC: 0 % (ref 0.0–0.2)

## 2022-10-09 LAB — HEMOGLOBIN A1C
Hgb A1c MFr Bld: 6.2 % — ABNORMAL HIGH (ref 4.8–5.6)
Mean Plasma Glucose: 131.24 mg/dL

## 2022-10-09 LAB — CBG MONITORING, ED
Glucose-Capillary: 94 mg/dL (ref 70–99)
Glucose-Capillary: 97 mg/dL (ref 70–99)

## 2022-10-09 MED ORDER — IRBESARTAN 150 MG PO TABS
150.0000 mg | ORAL_TABLET | Freq: Every day | ORAL | Status: DC
  Administered 2022-10-09: 150 mg via ORAL
  Filled 2022-10-09: qty 1

## 2022-10-09 MED ORDER — HYDROCODONE-ACETAMINOPHEN 5-325 MG PO TABS
1.0000 | ORAL_TABLET | Freq: Four times a day (QID) | ORAL | Status: DC | PRN
  Administered 2022-10-09: 1 via ORAL
  Filled 2022-10-09: qty 1

## 2022-10-09 MED ORDER — SODIUM CHLORIDE 0.9% FLUSH
3.0000 mL | Freq: Two times a day (BID) | INTRAVENOUS | Status: DC
  Administered 2022-10-09 (×2): 3 mL via INTRAVENOUS

## 2022-10-09 MED ORDER — INSULIN ASPART 100 UNIT/ML IJ SOLN
0.0000 [IU] | Freq: Every day | INTRAMUSCULAR | Status: DC
  Filled 2022-10-09: qty 1

## 2022-10-09 MED ORDER — ONDANSETRON HCL 4 MG PO TABS: 4.0000 mg | ORAL_TABLET | Freq: Four times a day (QID) | ORAL | Status: DC | PRN

## 2022-10-09 MED ORDER — COLCHICINE 0.6 MG PO TABS: 0.6000 mg | ORAL_TABLET | Freq: Every day | ORAL | Status: DC | PRN

## 2022-10-09 MED ORDER — INSULIN GLARGINE-YFGN 100 UNIT/ML ~~LOC~~ SOLN
8.0000 [IU] | Freq: Every day | SUBCUTANEOUS | Status: DC
  Administered 2022-10-09: 8 [IU] via SUBCUTANEOUS
  Filled 2022-10-09: qty 0.08

## 2022-10-09 MED ORDER — MORPHINE SULFATE (PF) 2 MG/ML IV SOLN: 2.0000 mg | INTRAVENOUS | Status: DC | PRN

## 2022-10-09 MED ORDER — INSULIN GLARGINE 100 UNIT/ML ~~LOC~~ SOLN: 8.0000 [IU] | Freq: Every day | SUBCUTANEOUS | Status: DC

## 2022-10-09 MED ORDER — EZETIMIBE 10 MG PO TABS
10.0000 mg | ORAL_TABLET | Freq: Every day | ORAL | Status: DC
  Administered 2022-10-09: 10 mg via ORAL
  Filled 2022-10-09: qty 1

## 2022-10-09 MED ORDER — DIAZEPAM 5 MG PO TABS: 5.0000 mg | ORAL_TABLET | Freq: Every evening | ORAL | Status: DC | PRN

## 2022-10-09 MED ORDER — QUETIAPINE FUMARATE 25 MG PO TABS: 25.0000 mg | ORAL_TABLET | Freq: Every day | ORAL | Status: DC

## 2022-10-09 MED ORDER — ONDANSETRON HCL 4 MG/2ML IJ SOLN: 4.0000 mg | Freq: Four times a day (QID) | INTRAMUSCULAR | Status: DC | PRN

## 2022-10-09 MED ORDER — CEPHALEXIN 500 MG PO CAPS: 500.0000 mg | ORAL_CAPSULE | Freq: Three times a day (TID) | ORAL | 0 refills | Status: AC

## 2022-10-09 MED ORDER — INSULIN GLARGINE-YFGN 100 UNIT/ML ~~LOC~~ SOLN
20.0000 [IU] | Freq: Every day | SUBCUTANEOUS | Status: DC
  Filled 2022-10-09: qty 0.2

## 2022-10-09 MED ORDER — METOPROLOL SUCCINATE ER 50 MG PO TB24
100.0000 mg | ORAL_TABLET | Freq: Two times a day (BID) | ORAL | Status: DC
  Administered 2022-10-09: 100 mg via ORAL
  Filled 2022-10-09: qty 2

## 2022-10-09 MED ORDER — NITROGLYCERIN 0.4 MG SL SUBL: 0.4000 mg | SUBLINGUAL_TABLET | SUBLINGUAL | Status: DC | PRN

## 2022-10-09 MED ORDER — ASPIRIN 81 MG PO TBEC
81.0000 mg | DELAYED_RELEASE_TABLET | Freq: Every day | ORAL | Status: DC
  Administered 2022-10-09: 81 mg via ORAL
  Filled 2022-10-09: qty 1

## 2022-10-09 MED ORDER — ALLOPURINOL 100 MG PO TABS
100.0000 mg | ORAL_TABLET | Freq: Every day | ORAL | Status: DC
  Administered 2022-10-09: 100 mg via ORAL
  Filled 2022-10-09: qty 1

## 2022-10-09 MED ORDER — INSULIN ASPART 100 UNIT/ML IJ SOLN: 0.0000 [IU] | Freq: Three times a day (TID) | INTRAMUSCULAR | Status: DC

## 2022-10-09 MED ORDER — ISOSORBIDE DINITRATE 30 MG PO TABS
30.0000 mg | ORAL_TABLET | Freq: Two times a day (BID) | ORAL | Status: DC
  Filled 2022-10-09: qty 1

## 2022-10-09 MED ORDER — DULOXETINE HCL 30 MG PO CPEP: 30.0000 mg | ORAL_CAPSULE | Freq: Every day | ORAL | Status: DC

## 2022-10-09 MED ORDER — ISOSORBIDE MONONITRATE ER 60 MG PO TB24
30.0000 mg | ORAL_TABLET | Freq: Two times a day (BID) | ORAL | Status: DC
  Administered 2022-10-09: 30 mg via ORAL
  Filled 2022-10-09: qty 1

## 2022-10-09 NOTE — ED Notes (Signed)
Patient's bandages on right elbow, back of head have been changed. Patient is soaking fingers of right hand now.

## 2022-10-09 NOTE — Assessment & Plan Note (Signed)
Continue telmisartan and metoprolol

## 2022-10-09 NOTE — TOC Transition Note (Signed)
Transition of Care [facility]) - CM/SW Discharge Note   Patient Details  Name: Edwin Olson MRN: [id number] Date of Birth: 1940-12-01  [facility] of [facility]) CM/SW Contact:  Margarito Liner, LCSW Phone Number: 10/09/2022, 2:03 PM   Clinical Narrative: Patient has orders to discharge home today. CSW met with patient. Daughter-in-law at bedside. CSW introduced role and explained that PT recommendations would be discussed. Patient is agreeable to home health. His wife uses [facility] so this is his preference. [facility] has accepted. No further concerns. CSW signing off.    Final next level of care: Home w Home Health Services Barriers to Discharge: No Barriers Identified   Patient Goals and CMS Choice   Choice offered to / list presented to : Patient  Discharge Placement                      Patient and family notified of of transfer: 10/09/22  Discharge Plan and Services Additional resources added to the After Visit Summary for                            [facility] Arranged: PT [facility] Agency: [facility] Date [facility] Agency Contacted: 10/09/22   Representative spoke with at [facility] Agency: [facility]  Social Determinants of Health (SDOH) Interventions SDOH Screenings   Depression (PHQ2-9): Low Risk  (04/30/2021)  Tobacco Use: Low Risk  (05/14/2022)     Readmission Risk Interventions     No data to display

## 2022-10-09 NOTE — Discharge Summary (Signed)
Physician Discharge Summary   Patient: Edwin Olson MRN: [id number] DOB: 02/08/1941  Admit date:     10/08/2022  Discharge date: 10/09/22  Discharge Physician: Alford Highland   PCP: Marguarite Arbour, MD   Recommendations at discharge:   Follow-up PCP in around 1 week.  Will need staples taken out of his scalp. Neurosurgery will call you to set up an appointment.  Discharge Diagnoses: Principal Problem:   Subdural hematoma Active Problems:   Dizziness concussion   CAD S/P percutaneous coronary angioplasty   DM type 2 with diabetic peripheral neuropathy   HTN (hypertension)   Chronic systolic CHF (congestive heart failure)   Accidental fall   Laceration of scalp    Hospital Course: 82 year old man with type 2 diabetes mellitus, neuropathy, chronic systolic heart failure, CAD, hypertension.  He was on the attic ladder coming down with a saw and the saw got stuck in the metal rail and he tried to jerk it out and fell backwards and hit his head.  Brief loss of consciousness.  In the ER he was found to have a small volume acute subdural hematoma of the right cerebral convexity, falx and tentorium with no mass effect.  Repeat CT scan 6 hours later showed a stable right sided subdural hematoma.  Neurosurgery reviewed the films and was okay with discharge home and his office will set up for an appointment as outpatient.  The patient did have staples placed in his head by ER physician.  These will need to be removed in 7 to 10 days.  Empiric Keflex was prescribed.  The patient also had skin tears on his fingers and arms which were dressed by the nursing staff.  Can leave on for a couple days then take off and can shower with soap and water.  Assessment and Plan: * Subdural hematoma CT scan 6 hours later was stable.  Neurosurgery reviewed films and was okay with discharge home and follow-up as outpatient.  Neurosurgery was okay with restarting aspirin but I held off on this because of  the bleeding on his fingers, scalp and arms.  Dizziness concussion Stable subdural hematoma Accidental fall off a ladder Patient's dizziness has subsided.  Ambulated with physical therapy.  Advised to come back to the hospital if any nausea vomiting or mental status change.   Laceration of scalp Staples placed by ER physician.  Will need them removed in 7 to 10 days.  Empiric Keflex.  Chronic systolic CHF (congestive heart failure) Clinically euvolemic Continue telmisartan, Isordil, metoprolol  HTN (hypertension) Continue telmisartan and metoprolol  DM type 2 with diabetic peripheral neuropathy Hemoglobin A1c low at 6.2.  Sugar was 97 this morning.  Decrease long-acting insulin to 8 units daily.  Hold off on Ozempic for right now. Continue gabapentin and Cymbalta  CAD S/P percutaneous coronary angioplasty No complaints of chest pain Continue metoprolol, isosorbide, ezetimibe, and telmisartan         Consultants: Spoke with neurosurgery Procedures performed: None Disposition: Home with home health Diet recommendation:  Cardiac and Carb modified diet DISCHARGE MEDICATION: Allergies as of 10/09/2022       Reactions   Clonidine Swelling   Pt states turned his mouth white inside and gums and teeth ached.   Dapagliflozin    Other reaction(s): Abdominal Pain   Glipizide Rash, Swelling   Atorvastatin    Other reaction(s): Other (See Comments), Unknown   Crestor [rosuvastatin] Itching   Doxycycline Other (See Comments)   Other reaction(s): Unknown  Fenofibrate    Other reaction(s): Other (See Comments) Severe indigestion   Isosorbide    Pt currently taking   Meclizine    Other reaction(s): Unknown   Percocet [oxycodone-acetaminophen] Nausea And Vomiting   Ultram [tramadol] Itching   Amlodipine Rash   Atenolol Rash   Clopidogrel Rash   Lisinopril Rash   Losartan Rash   Metaxalone Rash   Penicillins Rash   Has patient had a PCN reaction causing immediate rash,  facial/tongue/throat swelling, SOB or lightheadedness with hypotension: Yes Has patient had a PCN reaction causing severe rash involving mucus membranes or skin necrosis: Yes Has patient had a PCN reaction that required hospitalization Yes Has patient had a PCN reaction occurring within the last 10 years: No If all of the above answers are "NO", then may proceed with Cephalosporin use.   Plavix [clopidogrel Bisulfate] Itching, Rash        Medication List     STOP taking these medications    aspirin EC 81 MG tablet       TAKE these medications    allopurinol 100 MG tablet Commonly known as: ZYLOPRIM Take 100 mg by mouth daily.   azelastine 0.1 % nasal spray Commonly known as: ASTELIN Place into both nostrils 2 (two) times daily as needed for rhinitis. Use in each nostril as directed   cephALEXin 500 MG capsule Commonly known as: KEFLEX Take 1 capsule (500 mg total) by mouth 3 (three) times daily for 7 days.   colchicine 0.6 MG tablet Take 0.6 mg by mouth daily as needed.  First signs of gout, take 2 tablets immediately, an hour later take additional 1 tablet, the next day continue 1 tablet daily for 5 days   diazepam 5 MG tablet Commonly known as: VALIUM Take 5 mg by mouth at bedtime as needed for anxiety.   DULoxetine 30 MG capsule Commonly known as: CYMBALTA Take 30 mg by mouth at bedtime.   EPINEPHrine 0.3 mg/0.3 mL [place] injection Commonly known as: EPI-PEN Inject 0.3 mg into the muscle as needed for anaphylaxis.   ezetimibe 10 MG tablet Commonly known as: ZETIA Take 10 mg by mouth daily.   gabapentin 600 MG tablet Commonly known as: NEURONTIN Take 600 mg by mouth in the morning.   HYDROcodone-acetaminophen 5-325 MG tablet Commonly known as: NORCO/VICODIN Take 1 tablet by mouth every 6 (six) hours as needed for moderate pain.   insulin glargine 100 UNIT/ML injection Commonly known as: LANTUS Inject 0.08 mLs (8 Units total) into the skin daily. What  changed:  how much to take when to take this   isosorbide mononitrate 30 MG 24 hr tablet Commonly known as: IMDUR Take 30 mg by mouth 2 (two) times daily.   lovastatin 40 MG tablet Commonly known as: MEVACOR Take 40 mg by mouth at bedtime.   metFORMIN 500 MG tablet Commonly known as: GLUCOPHAGE Take 500 mg by mouth 2 (two) times daily with a meal.   metoprolol succinate 100 MG 24 hr tablet Commonly known as: TOPROL-XL Take 1 tablet by mouth 2 times daily at 12 noon and 4 pm.   nitroGLYCERIN 0.4 MG SL tablet Commonly known as: NITROSTAT Place 0.4 mg under the tongue every 5 (five) minutes as needed for chest pain.   Omeprazole-Sodium Bicarbonate 20-1100 MG Caps capsule Commonly known as: ZEGERID Take 1 capsule by mouth daily before breakfast.   OZEMPIC (0.25 OR 0.5 MG/DOSE) [place] Inject 0.45 mg into the skin once a week. Friday   QUEtiapine  25 MG tablet Commonly known as: SEROQUEL Take 25 mg by mouth at bedtime.   telmisartan 80 MG tablet Commonly known as: MICARDIS Take 1 tablet by mouth every morning.        Follow-up Information     Marguarite Arbour, MD Follow up in 7 day(s).   Specialty: Internal Medicine Why: will need to remove staples Contact information: [address] [facility] [place] [address] [phone]         Venetia Night, MD Follow up in 3 week(s).   Specialty: Neurosurgery Why: his office will call you about follow up appointment Contact information: [address] [place] [address] [phone]         [facility] Follow up.   Specialty: Home Health Services Why: They will follow up with you for your home heatlh physical therapy needs. Contact information: [address] [place] [address] [phone]                Discharge Exam: Ceasar Mons Weights   10/08/22 1530  Weight: 73.5 kg   Physical Exam HENT:     Head: Normocephalic.      Mouth/Throat:     Pharynx: No oropharyngeal exudate.  Eyes:     General: Lids are normal.     Conjunctiva/sclera: Conjunctivae normal.  Cardiovascular:     Rate and Rhythm: Normal rate and regular rhythm.     Heart sounds: Normal heart sounds, S1 normal and S2 normal.  Pulmonary:     Breath sounds: No decreased breath sounds, wheezing, rhonchi or rales.  Abdominal:     Palpations: Abdomen is soft.     Tenderness: There is no abdominal tenderness.  Musculoskeletal:     Right lower leg: No swelling.     Left lower leg: No swelling.  Skin:    General: Skin is warm.     Findings: No rash.     Comments: Skin tear bilateral arms.  Finger lacerations.  Scalp laceration covered.  Neurological:     Mental Status: He is alert and oriented to person, place, and time.     Comments: Power 5 out of 5 upper and lower extremities.      Condition at discharge: stable  The results of significant diagnostics from this hospitalization (including imaging, microbiology, ancillary and laboratory) are listed below for reference.   Imaging Studies: CT Head Wo Contrast  Result Date: 10/08/2022 CLINICAL DATA:  Follow-up subdural hematoma EXAM: CT HEAD WITHOUT CONTRAST TECHNIQUE: Contiguous axial images were obtained from the base of the skull through the vertex without intravenous contrast. RADIATION DOSE REDUCTION: This exam was performed according to the departmental dose-optimization program which includes automated exposure control, adjustment of the mA and/or kV according to patient size and/or use of iterative reconstruction technique. COMPARISON:  CT from earlier in the same day. FINDINGS: Brain: There is again noted a subdural hematoma along the posterior aspect of the falx and right tentorium which measures approximately 3 mm in thickness. The subdural over the convexity on the right is also seen and stable. No significant midline shift is noted. Mild atrophic changes are noted. Vascular: No  hyperdense vessel or unexpected calcification. Skull: Normal. Negative for fracture or focal lesion. Sinuses/Orbits: No acute finding. Other: Persistent scalp hematoma is noted near the vertex posteriorly. Surgical staples are noted. IMPRESSION: Stable right-sided subdural hematoma over the convexity as well as along the falx and right tentorium. Stable scalp hematoma. No new  focal abnormality is seen. Electronically Signed   By: Alcide Clever M.D.   On: 10/08/2022 22:59   CT Thoracic Spine Wo Contrast  Result Date: 10/08/2022 CLINICAL DATA:  Back trauma.  Fall. EXAM: CT THORACIC AND LUMBAR SPINE WITHOUT CONTRAST TECHNIQUE: Multidetector CT imaging of the thoracic and lumbar spine was performed without contrast. Multiplanar CT image reconstructions were also generated. RADIATION DOSE REDUCTION: This exam was performed according to the departmental dose-optimization program which includes automated exposure control, adjustment of the mA and/or kV according to patient size and/or use of iterative reconstruction technique. COMPARISON:  CT abdomen and pelvis 03/27/2020. CTA chest 08/03/2015. FINDINGS: CT THORACIC SPINE FINDINGS Alignment: Mild thoracic levoscoliosis. Grade 1 anterolisthesis of C7 on T1. Trace retrolisthesis of T12 on L1. Vertebrae: No acute fracture or suspicious osseous lesion. Paraspinal and other soft tissues: Partially visualized 1.5 cm low-density focus in the subcutaneous soft tissues of the back at T10-11 to the left of midline, likely an epidermal inclusion cyst. Coronary and aortic atherosclerosis. Disc levels: Particularly advanced disc degeneration at T1-2, T6-7, T7-8, and T10-11. No evidence of high-grade spinal stenosis. Advanced right facet arthrosis at T1-2 and T10-11 with moderate right neural foraminal stenosis at these levels. CT LUMBAR SPINE FINDINGS Segmentation: 5 lumbar type vertebrae. Alignment: Moderate lumbar dextroscoliosis. Minimal chronic retrolisthesis of L2 on L3, L3 on  L4, and L4 on L5. Vertebrae: No acute fracture or suspicious osseous lesion. Paraspinal and other soft tissues: Abdominal aortic atherosclerosis without aneurysm. Left nephrectomy. Disc levels: Advanced disc degeneration at L5-S1 as well as asymmetrically severe disc degeneration on the left at L2-3 and L3-4 and on the right at L4-5. Interbody ankylosis at L2-3. Widespread facet hypertrophy, most severe at L3-4. Moderate to severe spinal and left neural foraminal stenosis at L3-4. Mild-to-moderate spinal stenosis at L4-5 and mild spinal stenosis at L2-3. Moderate to severe right neural foraminal stenosis at L4-5 and L5-S1. IMPRESSION: 1. No acute osseous abnormality identified in the thoracic or lumbar spine. 2. Advanced thoracic and lumbar disc and facet degeneration. 3. Moderate to severe spinal and left neural foraminal stenosis at L3-4. 4. Moderate to severe right neural foraminal stenosis at L4-5 and L5-S1. 5.  Aortic Atherosclerosis (ICD10-I70.0). Electronically Signed   By: Sebastian Ache M.D.   On: 10/08/2022 16:41   CT Lumbar Spine Wo Contrast  Result Date: 10/08/2022 CLINICAL DATA:  Back trauma.  Fall. EXAM: CT THORACIC AND LUMBAR SPINE WITHOUT CONTRAST TECHNIQUE: Multidetector CT imaging of the thoracic and lumbar spine was performed without contrast. Multiplanar CT image reconstructions were also generated. RADIATION DOSE REDUCTION: This exam was performed according to the departmental dose-optimization program which includes automated exposure control, adjustment of the mA and/or kV according to patient size and/or use of iterative reconstruction technique. COMPARISON:  CT abdomen and pelvis 03/27/2020. CTA chest 08/03/2015. FINDINGS: CT THORACIC SPINE FINDINGS Alignment: Mild thoracic levoscoliosis. Grade 1 anterolisthesis of C7 on T1. Trace retrolisthesis of T12 on L1. Vertebrae: No acute fracture or suspicious osseous lesion. Paraspinal and other soft tissues: Partially visualized 1.5 cm  low-density focus in the subcutaneous soft tissues of the back at T10-11 to the left of midline, likely an epidermal inclusion cyst. Coronary and aortic atherosclerosis. Disc levels: Particularly advanced disc degeneration at T1-2, T6-7, T7-8, and T10-11. No evidence of high-grade spinal stenosis. Advanced right facet arthrosis at T1-2 and T10-11 with moderate right neural foraminal stenosis at these levels. CT LUMBAR SPINE FINDINGS Segmentation: 5 lumbar type vertebrae. Alignment: Moderate lumbar dextroscoliosis. Minimal chronic  retrolisthesis of L2 on L3, L3 on L4, and L4 on L5. Vertebrae: No acute fracture or suspicious osseous lesion. Paraspinal and other soft tissues: Abdominal aortic atherosclerosis without aneurysm. Left nephrectomy. Disc levels: Advanced disc degeneration at L5-S1 as well as asymmetrically severe disc degeneration on the left at L2-3 and L3-4 and on the right at L4-5. Interbody ankylosis at L2-3. Widespread facet hypertrophy, most severe at L3-4. Moderate to severe spinal and left neural foraminal stenosis at L3-4. Mild-to-moderate spinal stenosis at L4-5 and mild spinal stenosis at L2-3. Moderate to severe right neural foraminal stenosis at L4-5 and L5-S1. IMPRESSION: 1. No acute osseous abnormality identified in the thoracic or lumbar spine. 2. Advanced thoracic and lumbar disc and facet degeneration. 3. Moderate to severe spinal and left neural foraminal stenosis at L3-4. 4. Moderate to severe right neural foraminal stenosis at L4-5 and L5-S1. 5.  Aortic Atherosclerosis (ICD10-I70.0). Electronically Signed   By: Sebastian Ache M.D.   On: 10/08/2022 16:41   CT Head Wo Contrast  Result Date: 10/08/2022 CLINICAL DATA:  Head trauma.  Fall.  Loss of consciousness. EXAM: CT HEAD WITHOUT CONTRAST CT CERVICAL SPINE WITHOUT CONTRAST TECHNIQUE: Multidetector CT imaging of the head and cervical spine was performed following the standard protocol without intravenous contrast. Multiplanar CT image  reconstructions of the cervical spine were also generated. RADIATION DOSE REDUCTION: This exam was performed according to the departmental dose-optimization program which includes automated exposure control, adjustment of the mA and/or kV according to patient size and/or use of iterative reconstruction technique. COMPARISON:  CT head and cervical spine 10/13/2021 FINDINGS: CT HEAD FINDINGS Brain: Small volume acute subdural hematoma along the right aspect of the falx posteriorly measures up to 3 mm in thickness, and there is trace subdural hematoma along the right tentorium as well. Small subdural hematoma over the posterior right cerebral convexity measures up to 4 mm in thickness. No subarachnoid or parenchymal hemorrhage, acute infarct, mass, or midline shift is identified. Mild cerebral atrophy is within normal limits for age. 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 Vascular: Calcified atherosclerosis at the skull base. No hyperdense vessel. Skull: No acute fracture or suspicious osseous lesion. Sinuses/Orbits: Mild left ethmoid and right sphenoid sinus mucosal thickening. Clear mastoid air cells. Bilateral cataract extraction. Other: Moderately large posterior scalp hematoma, predominantly to the left of midline. CT CERVICAL SPINE FINDINGS Alignment: Chronic mild reversal of the normal cervical lordosis and moderate right convex curvature of the cervical spine. Unchanged grade 1 anterolisthesis of C3 on C4 and C7 on T1. Skull base and vertebrae: No acute fracture or suspicious osseous lesion. Soft tissues and spinal canal: No prevertebral fluid or swelling. No visible canal hematoma. Disc levels: Similar appearance of widespread advanced cervical disc degeneration and facet arthrosis. Left facet and partial interbody ankylosis at C2-3. Upper chest: The included lung apices  are clear. Other: Mild calcific atherosclerosis at the carotid bifurcations. Critical Value/emergent results were called by telephone at the time of interpretation on 10/08/2022 at 4:29 pm to Dr. Dionne Bucy, who verbally acknowledged these results. IMPRESSION: 1. Small volume acute subdural hematoma over the right cerebral convexity, falx, and tentorium. No mass effect. 2. Posterior scalp hematoma.  No skull fracture. 3. No acute cervical spine fracture. Electronically Signed   By: Sebastian Ache M.D.   On: 10/08/2022 16:31   CT Cervical Spine Wo Contrast  Result Date: 10/08/2022 CLINICAL DATA:  Head trauma.  Fall.  Loss of consciousness. EXAM: CT HEAD WITHOUT CONTRAST CT CERVICAL SPINE WITHOUT CONTRAST TECHNIQUE: Multidetector CT imaging of the head and cervical spine was performed following the standard protocol without intravenous contrast. Multiplanar CT image reconstructions of the cervical spine were also generated. RADIATION DOSE REDUCTION: This exam was performed according to the departmental dose-optimization program which includes automated exposure control, adjustment of the mA and/or kV according to patient size and/or use of iterative reconstruction technique. COMPARISON:  CT head and cervical spine 10/13/2021 FINDINGS: CT HEAD FINDINGS Brain: Small volume acute subdural hematoma along the right aspect of the falx posteriorly measures up to 3 mm in thickness, and there is trace subdural hematoma along the right tentorium as well. Small subdural hematoma over the posterior right cerebral convexity measures up to 4 mm in thickness. No subarachnoid or parenchymal hemorrhage, acute infarct, mass, or midline shift is identified. Mild cerebral atrophy is within normal limits for age. 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 Vascular: Calcified atherosclerosis at  the skull base. No hyperdense vessel. Skull: No acute fracture or suspicious osseous lesion. Sinuses/Orbits: Mild left ethmoid and right sphenoid sinus mucosal thickening. Clear mastoid air cells. Bilateral cataract extraction. Other: Moderately large posterior scalp hematoma, predominantly to the left of midline. CT CERVICAL SPINE FINDINGS Alignment: Chronic mild reversal of the normal cervical lordosis and moderate right convex curvature of the cervical spine. Unchanged grade 1 anterolisthesis of C3 on C4 and C7 on T1. Skull base and vertebrae: No acute fracture or suspicious osseous lesion. Soft tissues and spinal canal: No prevertebral fluid or swelling. No visible canal hematoma. Disc levels: Similar appearance of widespread advanced cervical disc degeneration and facet arthrosis. Left facet and partial interbody ankylosis at C2-3. Upper chest: The included lung apices are clear. Other: Mild calcific atherosclerosis at the carotid bifurcations. Critical Value/emergent results were called by telephone at the time of interpretation on 10/08/2022 at 4:29 pm to Dr. Dionne Bucy, who verbally acknowledged these results. IMPRESSION: 1. Small volume acute subdural hematoma over the right cerebral convexity, falx, and tentorium. No mass effect. 2. Posterior scalp hematoma.  No skull fracture. 3. No acute cervical spine fracture. Electronically Signed   By: Sebastian Ache M.D.   On: 10/08/2022 16:31   DG Pelvis 1-2 Views  Result Date: 10/08/2022 CLINICAL DATA:  Fall. EXAM: PELVIS - 1-2 VIEW COMPARISON:  None Available. FINDINGS: There is no evidence of pelvic fracture or diastasis. No pelvic bone lesions are seen. IMPRESSION: Negative. Electronically Signed   By: Lupita Raider M.D.   On: 10/08/2022 16:29   DG Chest Port 1 View  Result Date: 10/08/2022 CLINICAL DATA:  Fall. EXAM: PORTABLE CHEST 1 VIEW COMPARISON:  May 29, 2020. FINDINGS: The heart size and mediastinal contours are within normal limits.  Both lungs are clear. The visualized skeletal structures are unremarkable. IMPRESSION: No active disease. Electronically Signed   By: Lupita Raider M.D.   On: 10/08/2022 16:28   DG Shoulder Left  Result Date: 10/08/2022 CLINICAL DATA:  Left shoulder pain after fall. EXAM: LEFT SHOULDER - 2+ VIEW COMPARISON:  None Available. FINDINGS: There is no evidence of fracture or dislocation. Mild degenerative changes seen involving the left acromioclavicular joint. Soft tissues are unremarkable. 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 No acute abnormality seen. Electronically Signed   By: Zenda Alpers.D.  On: 10/08/2022 16:27    Microbiology: Results for orders placed or performed during the hospital encounter of 07/26/20  SARS CORONAVIRUS 2 (TAT 6-24 HRS) Nasopharyngeal Nasopharyngeal Swab     Status: None   Collection Time: 07/26/20 10:41 AM   Specimen: Nasopharyngeal Swab  Result Value Ref Range Status   SARS Coronavirus 2 NEGATIVE NEGATIVE Final    Comment: (NOTE) SARS-CoV-2 target nucleic acids are NOT DETECTED.  The SARS-CoV-2 RNA is generally detectable in upper and lower respiratory specimens during the acute phase of infection. Negative results do not preclude SARS-CoV-2 infection, do not rule out co-infections with other pathogens, and should not be used as the sole basis for treatment or other patient management decisions. Negative results must be combined with clinical observations, patient history, and epidemiological information. The expected result is Negative.  Fact Sheet for Patients: HairSlick.no  Fact Sheet for Healthcare Providers: quierodirigir.com  This test is not yet approved or cleared by the [place] FDA and  has been authorized for detection and/or diagnosis of SARS-CoV-2 by FDA under an Emergency Use Authorization (EUA). This EUA will remain  in effect (meaning  this test can be used) for the duration of the COVID-19 declaration under Se ction 564(b)(1) of the Act, 21 U.S.C. section 360bbb-3(b)(1), unless the authorization is terminated or revoked sooner.  Performed at [facility], [address] [place], [place] [address]     Labs: CBC: Recent Labs  Lab 10/08/22 2213 10/09/22 0820  WBC 12.4* 9.7  NEUTROABS 9.1*  --   HGB 11.7* 11.2*  HCT 34.8* 33.9*  MCV 89.9 90.4  PLT 217 211   Basic Metabolic Panel: Recent Labs  Lab 10/08/22 2213  NA 135  K 3.9  CL 106  CO2 21*  GLUCOSE 100*  BUN 18  CREATININE 1.22  CALCIUM 8.5*    CBG: Recent Labs  Lab 10/09/22 0059 10/09/22 0758  GLUCAP 94 97    Discharge time spent: greater than 30 minutes.  Signed: Alford Highland, MD [facility] 10/09/2022

## 2022-10-09 NOTE — Assessment & Plan Note (Addendum)
No complaints of chest pain Continue metoprolol, isosorbide, ezetimibe, and telmisartan

## 2022-10-09 NOTE — Telephone Encounter (Signed)
-----   Message from Venetia Night, MD sent at 10/09/2022 11:30 AM EDT ----- F/u head ct in 3-4 weeks and I'll call him  Thanks

## 2022-10-09 NOTE — ED Notes (Signed)
Pt was given food and po fluids, was able to drink/eat without issue.  Pt has not had any more dizziness since this RN has had pt and pt is wondering about the possibility of going home.   Pt's dressing was changed on his head--bloody drainage noted, bleeding is controlled.  Family member remains at bedside

## 2022-10-09 NOTE — Assessment & Plan Note (Signed)
Staples placed by ER physician.  Will need them removed in 7 to 10 days.  Empiric Keflex.

## 2022-10-09 NOTE — Telephone Encounter (Signed)
Order placed for CT head to be done around 10/30/22-11/06/22. Telephone call after per Dr Myer Haff.

## 2022-10-09 NOTE — Discharge Instructions (Signed)
Keep skin tears covered for 2 days.  Then can shower with soap and water in 2 days.  Cover wounds on arm and finger with bandaid in 2 days.  If any bleeding hold pressure.  Can go back on aspirin in a few days if no bleeding.

## 2022-10-09 NOTE — H&P (Signed)
History and Physical    Patient: Edwin Olson:[id number] DOB: [birth year] DOA: 10/08/2022 DOS: the patient was seen and examined on 10/09/2022 PCP: Marguarite Arbour, MD  Patient coming from: Home  Chief Complaint:  Chief Complaint  Patient presents with   Fall    Patient was climbing the attic stairs and fell backwards off the ladder landing on his back and head; He states that he did lose consciousness (pupils equal and reactive, motor strength strong and equal in all extremities, GCS 15); Takes 1 baby ASA daily; Patient does have laceration to his scalp, no other external injuries noted; Reports 6/10 lower back pain; Currently in C-collar due to mechanism of injury    HPI: Edwin Olson is a 82 y.o. 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  He fell backwards from a height of 4 feet landing on his back and hitting the back of his head.  He believes he lost consciousness.  Initial workup in the emergency room revealed a 4 mm subdural hematoma.  The ED provider spoke with neurosurgeon, Dr. Marcell Barlow who recommended a 6-hour repeat CT with continuation of aspirin if the CT was stable.  Other trauma workup including CT head, C-spine, thoracolumbar spine were all nonacute.  Chest shoulder and pelvic x-rays nonacute Patient's vitals remained stable in the ED with only mildly elevated blood pressure on initial arrival.  His blood work was unremarkable except for mild leukocytosis of 12,000 And anemia of 11.7 down from 13 a year prior. Repeat CT head after 6 hours showed stable findings however as patient was being ambulated for discharge he was dizzy and lightheaded with each attempt to ambulate. Hospitalist consulted for observation for possible concussion.  At the time of my evaluation, patient continues to feel  dizzy anytime he attempts to sit up.  He has mild back pain.  He denies other complaints.    Past Medical History:  Diagnosis Date   Arthritis    back   Congenital single kidney    only left kidney   Coronary artery disease    Diabetes mellitus without complication (HCC)    Diverticulitis    Diverticulitis    Dysrhythmia    PVC's   GERD (gastroesophageal reflux disease)    Hypertension    Mitral valve insufficiency    Stiff neck    Limited up and down motion due to arthritis   Past Surgical History:  Procedure Laterality Date   BACK SURGERY     CARDIAC CATHETERIZATION  2015   cardiac stents      cardiac stents    CATARACT EXTRACTION W/PHACO Right 04/29/2022   Procedure: CATARACT EXTRACTION PHACO AND INTRAOCULAR LENS PLACEMENT (IOC) RIGHT DIABETIC 10.86 01:31.1;  Surgeon: Lockie Mola, MD;  Location: [facility];  Service: Ophthalmology;  Laterality: Right;  diabetic   CATARACT EXTRACTION W/PHACO Left 05/13/2022   Procedure: CATARACT EXTRACTION PHACO AND INTRAOCULAR LENS PLACEMENT (IOC) LEFT DIABETIC;  Surgeon: Lockie Mola, MD;  Location: [facility];  Service: Ophthalmology;  Laterality: Left;  11.30 1:21.8   CHOLECYSTECTOMY     COLONOSCOPY WITH PROPOFOL N/A 05/10/2015   Procedure: COLONOSCOPY WITH PROPOFOL;  Surgeon: Wallace Cullens, MD;  Location: [facility];  Service: Gastroenterology;  Laterality: N/A;   COLONOSCOPY WITH PROPOFOL N/A 07/30/2020  Procedure: COLONOSCOPY WITH PROPOFOL;  Surgeon: Regis Bill, MD;  Location: [facility] END[facility];  Service: Endoscopy;  Laterality: N/A;   COLONOSCOPY WITH PROPOFOL N/A 03/30/2022   Procedure: COLONOSCOPY WITH PROPOFOL;  Surgeon: Regis Bill, MD;  Location: [facility] ENDOSCOPY;  Service: Endoscopy;  Laterality: N/A;   Social History:  reports that he has never smoked. He has never used smokeless tobacco. He reports that he does not drink alcohol and does not use drugs.  Allergies  Allergen  Reactions   Clonidine Swelling    Pt states turned his mouth white inside and gums and teeth ached.   Dapagliflozin     Other reaction(s): Abdominal Pain   Glipizide Rash and Swelling   Atorvastatin     Other reaction(s): Other (See Comments), Unknown   Crestor [Rosuvastatin] Itching   Doxycycline Other (See Comments)    Other reaction(s): Unknown   Fenofibrate     Other reaction(s): Other (See Comments) Severe indigestion   Isosorbide     Pt currently taking   Meclizine     Other reaction(s): Unknown   Percocet [Oxycodone-Acetaminophen] Nausea And Vomiting   Ultram [Tramadol] Itching   Amlodipine Rash   Atenolol Rash   Clopidogrel Rash   Lisinopril Rash   Losartan Rash   Metaxalone Rash   Penicillins Rash    Has patient had a PCN reaction causing immediate rash, facial/tongue/throat swelling, SOB or lightheadedness with hypotension: Yes Has patient had a PCN reaction causing severe rash involving mucus membranes or skin necrosis: Yes Has patient had a PCN reaction that required hospitalization Yes Has patient had a PCN reaction occurring within the last 10 years: No If all of the above answers are "NO", then may proceed with Cephalosporin use.    Plavix [Clopidogrel Bisulfate] Itching and Rash    No family history on file.  Prior to Admission medications   Medication Sig Start Date End Date Taking? Authorizing Provider  allopurinol (ZYLOPRIM) 100 MG tablet Take 100 mg by mouth daily.    [provider]  aspirin EC 81 MG tablet Take 81 mg by mouth daily.    [provider]  azelastine (ASTELIN) 0.1 % nasal spray Place into both nostrils 2 (two) times daily as needed for rhinitis. Use in each nostril as directed    [provider]  colchicine 0.6 MG tablet Take 0.6 mg by mouth daily as needed.    [provider]  diazepam (VALIUM) 5 MG tablet Take 5 mg by mouth at bedtime as needed for anxiety.    [provider]  DULoxetine  (CYMBALTA) 30 MG capsule Take 30 mg by mouth at bedtime.    [provider]  EPINEPHrine 0.3 mg/0.3 mL IJ SOAJ injection Inject 0.3 mg into the muscle as needed for anaphylaxis.    [provider]  ezetimibe (ZETIA) 10 MG tablet Take 10 mg by mouth daily.    [provider]  gabapentin (NEURONTIN) 600 MG tablet Take 600 mg by mouth 2 (two) times daily.     [provider]  HYDROcodone-acetaminophen (NORCO/VICODIN) 5-325 MG tablet Take 1 tablet by mouth every 6 (six) hours as needed for moderate pain.    [provider]  insulin glargine (LANTUS) 100 UNIT/ML injection Inject 30 Units into the skin daily. 50 units at 2100    [provider]  insulin lispro (HUMALOG) 100 UNIT/ML injection Inject 15-22 Units into the skin 2 (two) times daily. 22 units in the morning and 15 units at  night Patient not taking: Reported on 04/23/2022    [provider]  isosorbide dinitrate (ISORDIL) 30 MG tablet Take 30 mg by mouth 2 (two) times daily.    [provider]  metFORMIN (GLUCOPHAGE) 500 MG tablet Take 500 mg by mouth 3 (three) times daily.    [provider]  metoprolol succinate (TOPROL-XL) 100 MG 24 hr tablet Take 1 tablet by mouth 2 times daily at 12 noon and 4 pm. 11/04/15   [provider]  nitroGLYCERIN (NITROSTAT) 0.4 MG SL tablet Place 0.4 mg under the tongue every 5 (five) minutes as needed for chest pain.    [provider]  Omeprazole-Sodium Bicarbonate (ZEGERID) 20-1100 MG CAPS capsule Take 1 capsule by mouth daily before breakfast.    [provider]  QUEtiapine (SEROQUEL) 25 MG tablet Take 25 mg by mouth at bedtime.    [provider]  Semaglutide (OZEMPIC, 0.25 OR 0.5 MG/DOSE, [place]) Inject 0.25 mg into the skin once a week. Friday    [provider]  telmisartan (MICARDIS) 80 MG tablet Take 1 tablet by mouth every morning. 01/06/16 05/13/22  [provider]  zolpidem  (AMBIEN) 5 MG tablet Take by mouth at bedtime as needed for sleep.    [provider]    Physical Exam: Vitals:   10/08/22 2030 10/08/22 2100 10/08/22 2200 10/08/22 2300  BP: 126/75 132/83 126/84 128/75  Pulse: 84 85 95 88  Resp: 19 15 20 14   Temp:      TempSrc:      SpO2: 96% 94% 97% 98%  Weight:      Height:       Physical Exam Vitals and nursing note reviewed.  Constitutional:      General: He is not in acute distress. HENT:     Head: Normocephalic.     Comments: Head bandaged.  Dried blood on pillowcase Cardiovascular:     Rate and Rhythm: Normal rate and regular rhythm.     Heart sounds: Normal heart sounds.  Pulmonary:     Effort: Pulmonary effort is normal.     Breath sounds: Normal breath sounds.  Abdominal:     Palpations: Abdomen is soft.     Tenderness: There is no abdominal tenderness.  Neurological:     Mental Status: Mental status is at baseline.     Labs on Admission: I have personally reviewed following labs and imaging studies  CBC: Recent Labs  Lab 10/08/22 2213  WBC 12.4*  NEUTROABS 9.1*  HGB 11.7*  HCT 34.8*  MCV 89.9  PLT 217   Basic Metabolic Panel: Recent Labs  Lab 10/08/22 2213  NA 135  K 3.9  CL 106  CO2 21*  GLUCOSE 100*  BUN 18  CREATININE 1.22  CALCIUM 8.5*   GFR: Estimated Creatinine Clearance: 50.2 mL/min (by C-G formula based on SCr of 1.22 mg/dL). Liver Function Tests: No results for input(s): "AST", "ALT", "ALKPHOS", "BILITOT", "PROT", "ALBUMIN" in the last 168 hours. No results for input(s): "LIPASE", "AMYLASE" in the last 168 hours. No results for input(s): "AMMONIA" in the last 168 hours. Coagulation Profile: No results for input(s): "INR", "PROTIME" in the last 168 hours. Cardiac Enzymes: No results for input(s): "CKTOTAL", "CKMB", "CKMBINDEX", "TROPONINI" in the last 168 hours. BNP (last 3 results) No results for input(s): "PROBNP" in the last 8760 hours. HbA1C: No results for input(s): "HGBA1C"  in the last 72 hours. CBG: No results for input(s): "GLUCAP" in the last 168 hours. Lipid Profile:  No results for input(s): "CHOL", "HDL", "LDLCALC", "TRIG", "CHOLHDL", "LDLDIRECT" in the last 72 hours. Thyroid Function Tests: No results for input(s): "TSH", "T4TOTAL", "FREET4", "T3FREE", "THYROIDAB" in the last 72 hours. Anemia Panel: No results for input(s): "VITAMINB12", "FOLATE", "FERRITIN", "TIBC", "IRON", "RETICCTPCT" in the last 72 hours. Urine analysis:    Component Value Date/Time   COLORURINE STRAW (A) 08/03/2015 1824   APPEARANCEUR CLEAR (A) 08/03/2015 1824   APPEARANCEUR Clear 10/27/2013 2133   LABSPEC 1.008 08/03/2015 1824   LABSPEC 1.008 10/27/2013 2133   PHURINE 6.0 08/03/2015 1824   GLUCOSEU NEGATIVE 08/03/2015 1824   GLUCOSEU Negative 10/27/2013 2133   HGBUR 1+ (A) 08/03/2015 1824   BILIRUBINUR NEGATIVE 08/03/2015 1824   BILIRUBINUR Negative 10/27/2013 2133   KETONESUR NEGATIVE 08/03/2015 1824   PROTEINUR NEGATIVE 08/03/2015 1824   NITRITE NEGATIVE 08/03/2015 1824   LEUKOCYTESUR NEGATIVE 08/03/2015 1824   LEUKOCYTESUR Negative 10/27/2013 2133    Radiological Exams on Admission: CT Head Wo Contrast  Result Date: 10/08/2022 CLINICAL DATA:  Follow-up subdural hematoma EXAM: CT HEAD WITHOUT CONTRAST TECHNIQUE: Contiguous axial images were obtained from the base of the skull through the vertex without intravenous contrast. RADIATION DOSE REDUCTION: This exam was performed according to the departmental dose-optimization program which includes automated exposure control, adjustment of the mA and/or kV according to patient size and/or use of iterative reconstruction technique. COMPARISON:  CT from earlier in the same day. FINDINGS: Brain: There is again noted a subdural hematoma along the posterior aspect of the falx and right tentorium which measures approximately 3 mm in thickness. The subdural over the convexity on the right is also seen and stable. No significant midline  shift is noted. Mild atrophic changes are noted. Vascular: No hyperdense vessel or unexpected calcification. Skull: Normal. Negative for fracture or focal lesion. Sinuses/Orbits: No acute finding. Other: Persistent scalp hematoma is noted near the vertex posteriorly. Surgical staples are noted. IMPRESSION: Stable right-sided subdural hematoma over the convexity as well as along the falx and right tentorium. Stable scalp hematoma. No new focal abnormality is seen. Electronically Signed   By: Alcide Clever M.D.   On: 10/08/2022 22:59   CT Thoracic Spine Wo Contrast  Result Date: 10/08/2022 CLINICAL DATA:  Back trauma.  Fall. EXAM: CT THORACIC AND LUMBAR SPINE WITHOUT CONTRAST TECHNIQUE: Multidetector CT imaging of the thoracic and lumbar spine was performed without contrast. Multiplanar CT image reconstructions were also generated. RADIATION DOSE REDUCTION: This exam was performed according to the departmental dose-optimization program which includes automated exposure control, adjustment of the mA and/or kV according to patient size and/or use of iterative reconstruction technique. COMPARISON:  CT abdomen and pelvis 03/27/2020. CTA chest 08/03/2015. FINDINGS: CT THORACIC SPINE FINDINGS Alignment: Mild thoracic levoscoliosis. Grade 1 anterolisthesis of C7 on T1. Trace retrolisthesis of T12 on L1. Vertebrae: No acute fracture or suspicious osseous lesion. Paraspinal and other soft tissues: Partially visualized 1.5 cm low-density focus in the subcutaneous soft tissues of the back at T10-11 to the left of midline, likely an epidermal inclusion cyst. Coronary and aortic atherosclerosis. Disc levels: Particularly advanced disc degeneration at T1-2, T6-7, T7-8, and T10-11. No evidence of high-grade spinal stenosis. Advanced right facet arthrosis at T1-2 and T10-11 with moderate right neural foraminal stenosis at these levels. CT LUMBAR SPINE FINDINGS Segmentation: 5 lumbar type vertebrae. Alignment: Moderate lumbar  dextroscoliosis. Minimal chronic retrolisthesis of L2 on L3, L3 on L4, and L4 on L5. Vertebrae: No acute fracture or suspicious osseous lesion. Paraspinal and other soft tissues: Abdominal  aortic atherosclerosis without aneurysm. Left nephrectomy. Disc levels: Advanced disc degeneration at L5-S1 as well as asymmetrically severe disc degeneration on the left at L2-3 and L3-4 and on the right at L4-5. Interbody ankylosis at L2-3. Widespread facet hypertrophy, most severe at L3-4. Moderate to severe spinal and left neural foraminal stenosis at L3-4. Mild-to-moderate spinal stenosis at L4-5 and mild spinal stenosis at L2-3. Moderate to severe right neural foraminal stenosis at L4-5 and L5-S1. IMPRESSION: 1. No acute osseous abnormality identified in the thoracic or lumbar spine. 2. Advanced thoracic and lumbar disc and facet degeneration. 3. Moderate to severe spinal and left neural foraminal stenosis at L3-4. 4. Moderate to severe right neural foraminal stenosis at L4-5 and L5-S1. 5.  Aortic Atherosclerosis (ICD10-I70.0). Electronically Signed   By: Sebastian Ache M.D.   On: 10/08/2022 16:41   CT Lumbar Spine Wo Contrast  Result Date: 10/08/2022 CLINICAL DATA:  Back trauma.  Fall. EXAM: CT THORACIC AND LUMBAR SPINE WITHOUT CONTRAST TECHNIQUE: Multidetector CT imaging of the thoracic and lumbar spine was performed without contrast. Multiplanar CT image reconstructions were also generated. RADIATION DOSE REDUCTION: This exam was performed according to the departmental dose-optimization program which includes automated exposure control, adjustment of the mA and/or kV according to patient size and/or use of iterative reconstruction technique. COMPARISON:  CT abdomen and pelvis 03/27/2020. CTA chest 08/03/2015. FINDINGS: CT THORACIC SPINE FINDINGS Alignment: Mild thoracic levoscoliosis. Grade 1 anterolisthesis of C7 on T1. Trace retrolisthesis of T12 on L1. Vertebrae: No acute fracture or suspicious osseous lesion.  Paraspinal and other soft tissues: Partially visualized 1.5 cm low-density focus in the subcutaneous soft tissues of the back at T10-11 to the left of midline, likely an epidermal inclusion cyst. Coronary and aortic atherosclerosis. Disc levels: Particularly advanced disc degeneration at T1-2, T6-7, T7-8, and T10-11. No evidence of high-grade spinal stenosis. Advanced right facet arthrosis at T1-2 and T10-11 with moderate right neural foraminal stenosis at these levels. CT LUMBAR SPINE FINDINGS Segmentation: 5 lumbar type vertebrae. Alignment: Moderate lumbar dextroscoliosis. Minimal chronic retrolisthesis of L2 on L3, L3 on L4, and L4 on L5. Vertebrae: No acute fracture or suspicious osseous lesion. Paraspinal and other soft tissues: Abdominal aortic atherosclerosis without aneurysm. Left nephrectomy. Disc levels: Advanced disc degeneration at L5-S1 as well as asymmetrically severe disc degeneration on the left at L2-3 and L3-4 and on the right at L4-5. Interbody ankylosis at L2-3. Widespread facet hypertrophy, most severe at L3-4. Moderate to severe spinal and left neural foraminal stenosis at L3-4. Mild-to-moderate spinal stenosis at L4-5 and mild spinal stenosis at L2-3. Moderate to severe right neural foraminal stenosis at L4-5 and L5-S1. IMPRESSION: 1. No acute osseous abnormality identified in the thoracic or lumbar spine. 2. Advanced thoracic and lumbar disc and facet degeneration. 3. Moderate to severe spinal and left neural foraminal stenosis at L3-4. 4. Moderate to severe right neural foraminal stenosis at L4-5 and L5-S1. 5.  Aortic Atherosclerosis (ICD10-I70.0). Electronically Signed   By: Sebastian Ache M.D.   On: 10/08/2022 16:41   CT Head Wo Contrast  Result Date: 10/08/2022 CLINICAL DATA:  Head trauma.  Fall.  Loss of consciousness. EXAM: CT HEAD WITHOUT CONTRAST CT CERVICAL SPINE WITHOUT CONTRAST TECHNIQUE: Multidetector CT imaging of the head and cervical spine was performed following the  standard protocol without intravenous contrast. Multiplanar CT image reconstructions of the cervical spine were also generated. RADIATION DOSE REDUCTION: This exam was performed according to the departmental dose-optimization program which includes automated exposure control, adjustment of the  mA and/or kV according to patient size and/or use of iterative reconstruction technique. COMPARISON:  CT head and cervical spine 10/13/2021 FINDINGS: CT HEAD FINDINGS Brain: Small volume acute subdural hematoma along the right aspect of the falx posteriorly measures up to 3 mm in thickness, and there is trace subdural hematoma along the right tentorium as well. Small subdural hematoma over the posterior right cerebral convexity measures up to 4 mm in thickness. No subarachnoid or parenchymal hemorrhage, acute infarct, mass, or midline shift is identified. Mild cerebral atrophy is within normal limits for age. 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 Vascular: Calcified atherosclerosis at the skull base. No hyperdense vessel. Skull: No acute fracture or suspicious osseous lesion. Sinuses/Orbits: Mild left ethmoid and right sphenoid sinus mucosal thickening. Clear mastoid air cells. Bilateral cataract extraction. Other: Moderately large posterior scalp hematoma, predominantly to the left of midline. CT CERVICAL SPINE FINDINGS Alignment: Chronic mild reversal of the normal cervical lordosis and moderate right convex curvature of the cervical spine. Unchanged grade 1 anterolisthesis of C3 on C4 and C7 on T1. Skull base and vertebrae: No acute fracture or suspicious osseous lesion. Soft tissues and spinal canal: No prevertebral fluid or swelling. No visible canal hematoma. Disc levels: Similar appearance of widespread advanced cervical disc degeneration and facet arthrosis. Left facet and partial  interbody ankylosis at C2-3. Upper chest: The included lung apices are clear. Other: Mild calcific atherosclerosis at the carotid bifurcations. Critical Value/emergent results were called by telephone at the time of interpretation on 10/08/2022 at 4:29 pm to Dr. Dionne Bucy, who verbally acknowledged these results. IMPRESSION: 1. Small volume acute subdural hematoma over the right cerebral convexity, falx, and tentorium. No mass effect. 2. Posterior scalp hematoma.  No skull fracture. 3. No acute cervical spine fracture. Electronically Signed   By: Sebastian Ache M.D.   On: 10/08/2022 16:31   CT Cervical Spine Wo Contrast  Result Date: 10/08/2022 CLINICAL DATA:  Head trauma.  Fall.  Loss of consciousness. EXAM: CT HEAD WITHOUT CONTRAST CT CERVICAL SPINE WITHOUT CONTRAST TECHNIQUE: Multidetector CT imaging of the head and cervical spine was performed following the standard protocol without intravenous contrast. Multiplanar CT image reconstructions of the cervical spine were also generated. RADIATION DOSE REDUCTION: This exam was performed according to the departmental dose-optimization program which includes automated exposure control, adjustment of the mA and/or kV according to patient size and/or use of iterative reconstruction technique. COMPARISON:  CT head and cervical spine 10/13/2021 FINDINGS: CT HEAD FINDINGS Brain: Small volume acute subdural hematoma along the right aspect of the falx posteriorly measures up to 3 mm in thickness, and there is trace subdural hematoma along the right tentorium as well. Small subdural hematoma over the posterior right cerebral convexity measures up to 4 mm in thickness. No subarachnoid or parenchymal hemorrhage, acute infarct, mass, or midline shift is identified. Mild cerebral atrophy is within normal limits for age. Patchy hypodensities in the cerebral white matter are stable to mildly progressive and are nonspecific but compatible with mild-to-moderate chronic small  vessel ischemic disease. Vascular: Calcified atherosclerosis at the skull base. No hyperdense vessel. Skull: No acute fracture or suspicious osseous lesion. Sinuses/Orbits: Mild left ethmoid and right sphenoid sinus mucosal thickening. Clear mastoid air cells. Bilateral cataract extraction. Other: Moderately large posterior scalp hematoma, predominantly to the left of midline. CT CERVICAL SPINE FINDINGS Alignment: Chronic mild reversal of the normal cervical lordosis  and moderate right convex curvature of the cervical spine. Unchanged grade 1 anterolisthesis of C3 on C4 and C7 on T1. Skull base and vertebrae: No acute fracture or suspicious osseous lesion. Soft tissues and spinal canal: No prevertebral fluid or swelling. No visible canal hematoma. Disc levels: Similar appearance of widespread advanced cervical disc degeneration and facet arthrosis. Left facet and partial interbody ankylosis at C2-3. Upper chest: The included lung apices are clear. Other: Mild calcific atherosclerosis at the carotid bifurcations. Critical Value/emergent results were called by telephone at the time of interpretation on 10/08/2022 at 4:29 pm to Dr. Dionne Bucy, who verbally acknowledged these results. IMPRESSION: 1. Small volume acute subdural hematoma over the right cerebral convexity, falx, and tentorium. No mass effect. 2. Posterior scalp hematoma.  No skull fracture. 3. No acute cervical spine fracture. Electronically Signed   By: Sebastian Ache M.D.   On: 10/08/2022 16:31   DG Pelvis 1-2 Views  Result Date: 10/08/2022 CLINICAL DATA:  Fall. EXAM: PELVIS - 1-2 VIEW COMPARISON:  None Available. FINDINGS: There is no evidence of pelvic fracture or diastasis. No pelvic bone lesions are seen. IMPRESSION: Negative. Electronically Signed   By: Lupita Raider M.D.   On: 10/08/2022 16:29   DG Chest Port 1 View  Result Date: 10/08/2022 CLINICAL DATA:  Fall. EXAM: PORTABLE CHEST 1 VIEW COMPARISON:  May 29, 2020. FINDINGS: The  heart size and mediastinal contours are within normal limits. Both lungs are clear. The visualized skeletal structures are unremarkable. IMPRESSION: No active disease. Electronically Signed   By: Lupita Raider M.D.   On: 10/08/2022 16:28   DG Shoulder Left  Result Date: 10/08/2022 CLINICAL DATA:  Left shoulder pain after fall. EXAM: LEFT SHOULDER - 2+ VIEW COMPARISON:  None Available. FINDINGS: There is no evidence of fracture or dislocation. Mild degenerative changes seen involving the left acromioclavicular joint. Soft tissues are unremarkable. 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 No acute abnormality seen. Electronically Signed   By: Lupita Raider M.D.   On: 10/08/2022 16:27     Data Reviewed: Relevant notes from primary care and specialist visits, past discharge summaries as available in EHR, including Care Everywhere. Prior diagnostic testing as pertinent to current admission diagnoses Updated medications and problem lists for reconciliation ED course, including vitals, labs, imaging, treatment and response to treatment Triage notes, nursing and pharmacy notes and ED provider's notes Notable results as noted in HPI   Assessment and Plan: * Dizziness concussion Stable subdural hematoma Accidental fall off a ladder Patient with persistent dizziness and lightheadedness with ambulation and stable 4 mm subdural hematoma secondary to fall from ladder Trauma imaging including CT head C-spine and thoracolumbar spine all nonacute, chest shoulder and pelvic x-rays Fall precautions Pain control Neurologic checks Monitor for signs and symptoms of undertook that injury   Vertigo, posttrauma Likely secondary to concussion If persistent, can consider ENT consult or MRI to evaluate for injury and detected on CT head  Chronic systolic CHF (congestive heart failure) Clinically euvolemic Continue telmisartan, Isordil, metoprolol  HTN  (hypertension) Continue telmisartan and metoprolol  DM type 2 with diabetic peripheral neuropathy Continue basal insulin Sliding scale insulin coverage Continue gabapentin and Cymbalta  CAD S/P percutaneous coronary angioplasty No complaints of chest pain Continue metoprolol, isosorbide, ezetimibe, aspirin and telmisartan   DVT prophylaxis: SCD  Consults: none  Advance Care Planning:   Code Status: Prior   Family Communication: Daughter-in-law at bedside  Disposition Plan: Back to previous  home environment  Severity of Illness: The appropriate patient status for this patient is OBSERVATION. Observation status is judged to be reasonable and necessary in order to provide the required intensity of service to ensure the patient's safety. The patient's presenting symptoms, physical exam findings, and initial radiographic and laboratory data in the context of their medical condition is felt to place them at decreased risk for further clinical deterioration. Furthermore, it is anticipated that the patient will be medically stable for discharge from the hospital within 2 midnights of admission.   Author: Andris Baumann, MD 10/09/2022 12:28 AM  For on call review [email or web address].

## 2022-10-09 NOTE — Assessment & Plan Note (Addendum)
Hemoglobin A1c low at 6.2.  Sugar was 97 this morning.  Decrease long-acting insulin to 8 units daily.  Hold off on Ozempic for right now. Continue gabapentin and Cymbalta

## 2022-10-09 NOTE — Assessment & Plan Note (Addendum)
Stable subdural hematoma Accidental fall off a ladder Patient's dizziness has subsided.  Ambulated with physical therapy.  Advised to come back to the hospital if any nausea vomiting or mental status change.

## 2022-10-09 NOTE — Assessment & Plan Note (Signed)
CT scan 6 hours later was stable.  Neurosurgery reviewed films and was okay with discharge home and follow-up as outpatient.  Neurosurgery was okay with restarting aspirin but I held off on this because of the bleeding on his fingers, scalp and arms.

## 2022-10-09 NOTE — Assessment & Plan Note (Deleted)
Likely secondary to concussion If persistent, can consider ENT consult or MRI to evaluate for injury and detected on CT head

## 2022-10-09 NOTE — Assessment & Plan Note (Signed)
Clinically euvolemic Continue telmisartan, Isordil, metoprolol

## 2022-10-09 NOTE — Telephone Encounter (Signed)
Patient still admitted.

## 2022-10-09 NOTE — Hospital Course (Signed)
82 year old man with type 2 diabetes mellitus, neuropathy, chronic systolic heart failure, CAD, hypertension.  He was on the attic ladder coming down with a saw and the saw got stuck in the metal rail and he tried to jerk it out and fell backwards and hit his head.  Brief loss of consciousness.  In the ER he was found to have a small volume acute subdural hematoma of the right cerebral convexity, falx and tentorium with no mass effect.  Repeat CT scan 6 hours later showed a stable right sided subdural hematoma.  Neurosurgery reviewed the films and was okay with discharge home and his office will set up for an appointment as outpatient.  The patient did have staples placed in his head by ER physician.  These will need to be removed in 7 to 10 days.  Empiric Keflex was prescribed.  The patient also had skin tears on his fingers and arms which were dressed by the nursing staff.  Can leave on for a couple days then take off and can shower with soap and water.

## 2022-10-09 NOTE — Evaluation (Addendum)
Physical Therapy Evaluation Patient Details Name: Edwin Olson MRN: [id number] DOB: 02/20/1941 Today's Date: 10/09/2022  History of Present Illness  Patient is a 82 year old male presenting after accidental fall off a ladder. dizziness and lightheadedness with ambulation and stable 4 mm subdural hematoma secondary to fall from ladder  Clinical Impression  Patient is agreeable to PT. He reports being independent at baseline and lives at home with his spouse. He recently had 5 weeks of outpatient PT for back pain.  Today, the patient feels mild dizziness with sitting upright initially that subsides. Orthostatic vitals taken. The patient walked initially with hand held assistance with mild unsteadiness that required minimal assistance. Recommend to trial rolling walker and gait pattern and balance were significantly improved. The patient reports right elbow pain with tenderness to palpation, however this did  not impact functional independence with bed mobility or ambulation. Recommend to continue PT while in the hospital to maximize independence and facilitate return to prior level of function. Anticipate the need for  intermittent assistance at home with continued PT recommend after hospital discharge.   Orthostatic VS for the past 24 hrs:  BP- Lying Pulse- Lying BP- Sitting Pulse- Sitting BP- Standing at 0 minutes Pulse- Standing at 0 minutes  10/09/22 0900 124/69 92 155/66 94 114/74 95         Recommendations for follow up therapy are one component of a multi-disciplinary discharge planning process, led by the attending physician.  Recommendations may be updated based on patient status, additional functional criteria and insurance authorization.  Follow Up Recommendations       Assistance Recommended at Discharge Intermittent Supervision/Assistance  Patient can return home with the following  A little help with walking and/or transfers;A little help with bathing/dressing/bathroom;Assist  for transportation;Help with stairs or ramp for entrance;Assistance with cooking/housework    Equipment Recommendations None recommended by PT  Recommendations for Other Services       Functional Status Assessment Patient has had a recent decline in their functional status and demonstrates the ability to make significant improvements in function in a reasonable and predictable amount of time.     Precautions / Restrictions Precautions Precautions: Fall Precaution Comments: dizziness with mobility Restrictions Weight Bearing Restrictions: No      Mobility  Bed Mobility Overal bed mobility: Needs Assistance Bed Mobility: Supine to Sit, Sit to Supine     Supine to sit: Min guard Sit to supine: Min guard   General bed mobility comments: increased time required to complete tasks. dizziness initially reported with sitting upright that subsides with increased sitting time    Transfers Overall transfer level: Needs assistance Equipment used: None, Rolling walker (2 wheels) Transfers: Sit to/from Stand Sit to Stand: Min guard           General transfer comment: 2 bouts of standing performed. verbal cues for safety. no dizziness is reported with standing activity    Ambulation/Gait Ambulation/Gait assistance: Min guard, Min assist Gait Distance (Feet): 75 Feet Assistive device: Rolling walker (2 wheels), None Gait Pattern/deviations: Trunk flexed, Narrow base of support Gait velocity: decreased     General Gait Details: patient ambulated without rolling walker with mild unsteadiness, requiring Min A for stability. with rolling walker, gait pattern and balance improved. occasional cues for reinforcement of proper rolling walker placement. no dizziness reported with upright activity  Stairs            Wheelchair Mobility    Modified Rankin (Stroke Patients Only)  Balance                                             Pertinent  Vitals/Pain Pain Assessment Pain Assessment: Faces Faces Pain Scale: Hurts little more Pain Location: R elbow Pain Descriptors / Indicators: Sore Pain Intervention(s): Limited activity within patient's tolerance, Monitored during session, Repositioned    Home Living Family/patient expects to be discharged to:: Private residence Living Arrangements: Spouse/significant other Available Help at Discharge: Family;Personal care attendant;Available PRN/intermittently Type of Home: House Home Access: Stairs to enter   Entrance Stairs-Number of Steps: 2   Home Layout: One level Home Equipment: Agricultural consultant (2 wheels);Cane - single point Additional Comments: just finished 5 weeks of outpatient PT for back pain    Prior Function Prior Level of Function : Independent/Modified Independent;Driving                     Hand Dominance   Dominant Hand: Left    Extremity/Trunk Assessment   Upper Extremity Assessment Upper Extremity Assessment: RUE deficits/detail RUE Deficits / Details: elbow pain reported with tenderness to palpation posteriorly. skin tear above the elbow was covered with bandanges. ROM is [facility]    Lower Extremity Assessment Lower Extremity Assessment: Overall WFL for tasks assessed       Communication   Communication: No difficulties  Cognition Arousal/Alertness: Awake/alert Behavior During Therapy: WFL for tasks assessed/performed Overall Cognitive Status: Within Functional Limits for tasks assessed                                 General Comments: was unable to recall the name of his blood pressure medication. otherwise cognition seems intact. cooperative throughout and able to follow all commands without difficulty        General Comments General comments (skin integrity, edema, etc.): orthostatic vitals taken during session    Exercises     Assessment/Plan    PT Assessment Patient needs continued PT services  PT Problem List  Decreased strength;Decreased range of motion;Decreased activity tolerance;Decreased balance;Decreased mobility;Decreased safety awareness;Pain       PT Treatment Interventions DME instruction;Gait training;Stair training;Functional mobility training;Therapeutic activities;Therapeutic exercise;Balance training;Neuromuscular re-education;Cognitive remediation;Patient/family education    PT Goals (Current goals can be found in the Care Plan section)  Acute Rehab PT Goals Patient Stated Goal: to return home PT Goal Formulation: With patient Time For Goal Achievement: 10/23/22 Potential to Achieve Goals: Good    Frequency Min 3X/week     Co-evaluation               AM-PAC PT "6 Clicks" Mobility  Outcome Measure Help needed turning from your back to your side while in a flat bed without using bedrails?: A Little Help needed moving from lying on your back to sitting on the side of a flat bed without using bedrails?: A Little Help needed moving to and from a bed to a chair (including a wheelchair)?: A Little Help needed standing up from a chair using your arms (e.g., wheelchair or bedside chair)?: A Little Help needed to walk in hospital room?: A Little Help needed climbing 3-5 steps with a railing? : A Little 6 Click Score: 18    End of Session   Activity Tolerance: Patient tolerated treatment well Patient left: in bed;with call bell/phone within reach;with  bed alarm set Nurse Communication: Mobility status PT Visit Diagnosis: Unsteadiness on feet (R26.81);Muscle weakness (generalized) (M62.81)    Time: 0092-3300 PT Time Calculation (min) (ACUTE ONLY): 33 min   Charges:   PT Evaluation $PT Eval Low Complexity: 1 Low PT Treatments $Gait Training: 8-22 mins       Donna Bernard, PT, MPT   Ina Homes 10/09/2022, 10:50 AM

## 2022-10-12 NOTE — Telephone Encounter (Signed)
Patient confirmed telephone visit for 11/05/2022

## 2022-10-28 ENCOUNTER — Encounter: Payer: Medicare Other | Attending: Internal Medicine | Admitting: Internal Medicine

## 2022-10-28 DIAGNOSIS — I509 Heart failure, unspecified: Secondary | ICD-10-CM | POA: Diagnosis not present

## 2022-10-28 DIAGNOSIS — T798XXA Other early complications of trauma, initial encounter: Secondary | ICD-10-CM | POA: Diagnosis not present

## 2022-10-28 DIAGNOSIS — W19XXXA Unspecified fall, initial encounter: Secondary | ICD-10-CM | POA: Diagnosis not present

## 2022-10-28 DIAGNOSIS — Z794 Long term (current) use of insulin: Secondary | ICD-10-CM | POA: Diagnosis not present

## 2022-10-28 DIAGNOSIS — I110 Hypertensive heart disease with heart failure: Secondary | ICD-10-CM | POA: Diagnosis not present

## 2022-10-28 DIAGNOSIS — E11622 Type 2 diabetes mellitus with other skin ulcer: Secondary | ICD-10-CM | POA: Diagnosis not present

## 2022-10-28 DIAGNOSIS — S0180XA Unspecified open wound of other part of head, initial encounter: Secondary | ICD-10-CM | POA: Diagnosis present

## 2022-10-28 DIAGNOSIS — L98492 Non-pressure chronic ulcer of skin of other sites with fat layer exposed: Secondary | ICD-10-CM | POA: Diagnosis not present

## 2022-10-29 NOTE — Progress Notes (Signed)
Edwin, Olson ([id number]) [phone] [email or web address] Page 1 of 4 Visit Report for 10/28/2022 Abuse Risk Screen Details Patient Name: Date of Service: Edwin Olson, Edwin Olson 10/28/2022 8:45 A M Medical Record Number: [id number] Patient Account Number: [id number] Date of Birth/Sex: Treating RN: 01/21/1941 (82 y.o. Edwin Olson) Yevonne Pax Primary Care Adi Doro: Aram Beecham Other Clinician: Referring Sheray Grist: Treating Luella Gardenhire/Extender: Aldona Bar in Treatment: 0 Abuse Risk Screen Items Answer ABUSE RISK SCREEN: Has anyone close to you tried to hurt or harm you recentlyo No Do you feel uncomfortable with anyone in your familyo No Has anyone forced you do things that you didnt want to doo No Electronic Signature(s) Signed: 10/28/2022 9:40:32 AM By: Yevonne Pax RN Entered By: Yevonne Pax on 10/28/2022 08:48:26 -------------------------------------------------------------------------------- Activities of Daily Living Details Patient Name: Date of Service: Edwin, Olson 10/28/2022 8:45 A M Medical Record Number: [id number] Patient Account Number: [id number] Date of Birth/Sex: Treating RN: Jan 26, 1941 (82 y.o. Edwin Olson) Yevonne Pax Primary Care Gabriel Conry: Aram Beecham Other Clinician: Referring Allisyn Kunz: Treating Atlee Villers/Extender: Aldona Bar in Treatment: 0 Activities of Daily Living Items Answer Activities of Daily Living (Please select one for each item) Drive Automobile Completely Able T Medications ake Completely Able Use T elephone Completely Able Care for Appearance Completely Able Use T oilet Completely Able Bath / Shower Completely Able Dress Self Completely Able Feed Self Completely Able Walk Completely Able Get In / Out Bed Completely Able Housework Completely Able Prepare Meals Completely Able Handle Money Completely Able Shop for Self Completely Able Electronic Signature(s) Signed: 10/28/2022 9:40:32 AM  By: Yevonne Pax RN Entered By: Yevonne Pax on 10/28/2022 08:48:48 -------------------------------------------------------------------------------- Education Screening Details Patient Name: Date of Service: Edwin Olson 10/28/2022 8:45 A M Medical Record Number: [id number] Patient Account Number: [id number] Date of Birth/Sex: Treating RN: 1940-08-03 (82 y.o. [name] Primary Care Matasha Smigelski: Aram Beecham Other Clinician: Referring Kennady Zimmerle: Treating Januel Doolan/Extender: Aldona Bar in Treatment: 0 ICHIRO, CHESNUT ([id number]) [phone] [id number] Page 2 of 4 Primary Learner Assessed: Patient Learning Preferences/Education Level/Primary Language Learning Preference: Explanation Highest Education Level: High School Preferred Language: English Cognitive Barrier Language Barrier: No Translator Needed: No Memory Deficit: No Emotional Barrier: No Cultural/Religious Beliefs Affecting Medical Care: No Physical Barrier Impaired Vision: Yes Glasses Impaired Hearing: No Decreased Hand dexterity: No Knowledge/Comprehension Knowledge Level: High Comprehension Level: High Ability to understand written instructions: High Ability to understand verbal instructions: High Motivation Anxiety Level: Anxious Cooperation: Cooperative Education Importance: Acknowledges Need Interest in Health Problems: Asks Questions Perception: Coherent Willingness to Engage in Self-Management High Activities: Readiness to Engage in Self-Management High Activities: Electronic Signature(s) Signed: 10/28/2022 9:40:32 AM By: Yevonne Pax RN Entered By: Yevonne Pax on 10/28/2022 08:49:34 -------------------------------------------------------------------------------- Fall Risk Assessment Details Patient Name: Date of Service: Edwin Olson 10/28/2022 8:45 A M Medical Record Number: [id number] Patient Account Number: [id number] Date of Birth/Sex:  Treating RN: 11/18/1940 (82 y.o. Edwin Olson) Yevonne Pax Primary Care Joslyne Marshburn: Aram Beecham Other Clinician: Referring Janari Yamada: Treating Edwena Mayorga/Extender: Aldona Bar in Treatment: 0 Fall Risk Assessment Items Have you had 2 or more falls in the last 12 monthso 0 Yes Have you had any fall that resulted in injury in the last 12 monthso 0 Yes FALLS RISK SCREEN History of falling - immediate or within 3 months 25 Yes Secondary diagnosis (Do you have 2 or more medical diagnoseso) 0 No Ambulatory aid None/bed rest/wheelchair/nurse 0 Yes Crutches/cane/walker 0 No Furniture 0 No Intravenous therapy  Access/Saline/Heparin Lock 0 No Gait/Transferring Normal/ bed rest/ wheelchair 0 Yes Weak (short steps with or without shuffle, stooped but able to lift head while walking, may seek 0 No support from furniture) Impaired (short steps with shuffle, may have difficulty arising from chair, head down, impaired 0 No balance) Mental Status Oriented to own ability 0 Yes Overestimates or forgets limitations 0 No Risk Level: Medium Risk Score: 25 Pappalardo, Donavyn G ([id number]) [phone] [id number] Page 3 of 4 Electronic Signature(s) -------------------------------------------------------------------------------- Foot Assessment Details Patient Name: Date of Service: Edwin, Olson 10/28/2022 8:45 A M Medical Record Number: [id number] Patient Account Number: [id number] Date of Birth/Sex: Treating RN: December 13, 1940 (82 y.o. Edwin Olson) Yevonne Pax Primary Care Denni [place]: Aram Beecham Other Clinician: Referring Odesser Tourangeau: Treating Subhan Hoopes/Extender: Aldona Bar in Treatment: 0 Foot Assessment Items Site Locations + = Sensation present, - = Sensation absent, C = Callus, U = Ulcer R = Redness, W = Warmth, M = Maceration, PU = Pre-ulcerative lesion F = Fissure, S = Swelling, D = Dryness Assessment Right: Left: Other Deformity: No  No Prior Foot Ulcer: No No Prior Amputation: No No Charcot Joint: No No Ambulatory Status: Ambulatory Without Help Gait: Steady Electronic Signature(s) Signed: 10/28/2022 9:40:32 AM By: Yevonne Pax RN Entered By: Yevonne Pax on 10/28/2022 08:50:10 -------------------------------------------------------------------------------- Nutrition Risk Screening Details Patient Name: Date of Service: THEOPHILUS, WALZ 10/28/2022 8:45 A M Medical Record Number: [id number] Patient Account Number: [id number] Date of Birth/Sex: Treating RN: [birth year] (82 y.o. [name] Primary Care Takeysha Bonk: Aram Beecham Other Clinician: Referring Jahiem Franzoni: Treating Ripley Lovecchio/Extender: Aldona Bar in Treatment: 0 Height (in): 71 Weight (lbs): 168 Body Mass Index (BMI): 23.4 Sebek, Kalim G ([id number]) [id number] Page 4 of 4 Nutrition Risk Screening Items Score Screening NUTRITION RISK SCREEN: I have an illness or condition that made me change the kind and/or amount of food I eat 0 No I eat fewer than two meals per day 0 No I eat few fruits and vegetables, or milk products 0 No I have three or more drinks of beer, liquor or wine almost every day 0 No I have tooth or mouth problems that make it hard for me to eat 0 No I don't always have enough money to buy the food I need 0 No I eat alone most of the time 0 No I take three or more different prescribed or over-the-counter drugs a day 1 Yes Without wanting to, I have lost or gained 10 pounds in the last six months 0 No I am not always physically able to shop, cook and/or feed myself 0 No Nutrition Protocols Good Risk Protocol 0 No interventions needed Moderate Risk Protocol High Risk Proctocol Risk Level: Good Risk Score: 1 Electronic Signature(s) Signed: 10/28/2022 9:40:32 AM By: Yevonne Pax RN Entered By: Yevonne Pax on 10/28/2022 08:49:59

## 2022-10-29 NOTE — Progress Notes (Signed)
LABRANDON, Edwin Olson ([id number]) [id number] Page 1 of 7 Visit Report for 10/28/2022 Chief Complaint Document Details Patient Name: Date of Service: Edwin Olson, Edwin Olson 10/28/2022 8:45 A M Medical Record Number: [id number] Patient Account Number: [id number] Date of Birth/Sex: Treating RN: [birth year] (82 y.o. Edwin Olson) Yevonne Pax Primary Care Provider: Aram Beecham Other Clinician: Referring Provider: Treating Provider/Extender: Aldona Bar in Treatment: 0 Information Obtained from: Patient Chief Complaint 10/28/2022; scalp wound Electronic Signature(s) Signed: 10/28/2022 10:12:26 AM By: Geralyn Corwin DO Entered By: Geralyn Corwin on 10/28/2022 09:23:12 -------------------------------------------------------------------------------- Debridement Details Patient Name: Date of Service: Edwin Olson 10/28/2022 8:45 A M Medical Record Number: [id number] Patient Account Number: [id number] Date of Birth/Sex: Treating RN: 1941-05-14 (82 y.o. Edwin Olson) Yevonne Pax Primary Care Provider: Aram Beecham Other Clinician: Referring Provider: Treating Provider/Extender: Aldona Bar in Treatment: 0 Debridement Performed for Assessment: Wound #1 Head - occiput Performed By: Physician Geralyn Corwin, MD Debridement Type: Debridement Level of Consciousness (Pre-procedure): Awake and Alert Pre-procedure Verification/Time Out Yes - 09:09 Taken: Start Time: 09:09 Pain Control: Lidocaine 4% T opical Solution Percent of Wound Bed Debrided: 100% T Area Debrided (cm): otal 2.36 Tissue and other material debrided: Viable, Non-Viable, Eschar, Slough, Slough Level: Non-Viable Tissue Debridement Description: Selective/Open Wound Instrument: Curette Bleeding: Moderate Hemostasis Achieved: Silver Nitrate End Time: 09:14 Procedural Pain: 0 Post Procedural Pain: 0 Response to Treatment: Procedure was tolerated well Level of  Consciousness (Post- Awake and Alert procedure): Post Debridement Measurements of Total Wound Length: (cm) 2 Width: (cm) 1.5 Depth: (cm) 0.3 Volume: (cm) 0.707 Character of Wound/Ulcer Post Debridement: Improved Post Procedure Diagnosis Same as Pre-procedure Electronic Signature(s) Signed: 10/28/2022 9:40:32 AM By: Yevonne Pax RN Signed: 10/28/2022 10:12:26 AM By: Geralyn Corwin DO Entered By: Yevonne Pax on 10/28/2022 09:14:34 Neises, Cleopatra Cedar ([id number]) [id number] Page 2 of 7 -------------------------------------------------------------------------------- HPI Details Patient Name: Date of Service: Edwin Olson, Edwin Olson 10/28/2022 8:45 A M Medical Record Number: [id number] Patient Account Number: [id number] Date of Birth/Sex: Treating RN: April 03, 1941 (82 y.o. Edwin Olson) Yevonne Pax Primary Care Provider: Aram Beecham Other Clinician: Referring Provider: Treating Provider/Extender: Aldona Bar in Treatment: 0 History of Present Illness HPI Description: 10/28/2022 [name] is an 82 year old male with a past medical history of CAD, controlled type 2 diabetes that presents the clinic for a 2-week history of open wound to the scalp. On 10/08/2022 he visited the ED after experiencing a fall. He had a laceration to the scalp and had 13 staples placed. He had these removed on 4/19 and the wound dehisced. He has been keeping the area covered. Of note when he had a CT scan it was noted to have an acute subdural hematoma over the right cerebral convexity, falx and tentorium. On repeat 6 hours later this was noted to be stable. He is scheduled to have an MRI later this month. He currently denies any recent loss of consciousness, dizziness or falls. Electronic Signature(s) Signed: 10/28/2022 10:12:26 AM By: Geralyn Corwin DO Entered By: Geralyn Corwin on 10/28/2022  09:26:32 -------------------------------------------------------------------------------- Physical Exam Details Patient Name: Date of Service: Edwin Olson 10/28/2022 8:45 A M Medical Record Number: [id number] Patient Account Number: [id number] Date of Birth/Sex: Treating RN: [birth year] (82 y.o. Edwin Olson Primary Care Provider: Aram Beecham Other Clinician: Referring Provider: Treating Provider/Extender: Aldona Bar in Treatment: 0 Constitutional . Psychiatric . Notes T the posterior aspect of the head in the occipital region there is an open wound  with eschar, nonviable tissue and scant granulation tissue. There is some o undermining from the 11 to 2 o'clock position. This does not probe to the calvarium. Electronic Signature(s) Signed: 10/28/2022 10:12:26 AM By: Geralyn Corwin DO Entered By: Geralyn Corwin on 10/28/2022 09:27:55 -------------------------------------------------------------------------------- Physician Orders Details Patient Name: Date of Service: Edwin Olson 10/28/2022 8:45 A M Medical Record Number: [id number] Patient Account Number: [id number] Date of Birth/Sex: Treating RN: 1941-01-25 (82 y.o. Edwin Olson) Yevonne Pax Primary Care Provider: Aram Beecham Other Clinician: Referring Provider: Treating Provider/Extender: Aldona Bar in Treatment: 0 Verbal / Phone Orders: No Diagnosis Coding ICD-10 Coding Code Description S01.80XA Unspecified open wound of other part of head, initial encounter L98.492 Non-pressure chronic ulcer of skin of other sites with fat layer exposed E11.622 Type 2 diabetes mellitus with other skin ulcer T79.8XXA Other early complications of trauma, initial encounter OCTAVIS, SHEELER ([id number]) [id number] Page 3 of 7 Follow-up Appointments Return Appointment in 1 week. Bathing/ Shower/ Hygiene Other: - do not get wet Anesthetic (Use  'Patient Medications' Section for Anesthetic Order Entry) Lidocaine applied to wound bed Wound Treatment Wound #1 - Head - occiput Cleanser: Byram Ancillary Kit - 15 Day Supply (DME) (Generic) 1 x Per Day/30 Days Discharge Instructions: Use supplies as instructed; Kit contains: (15) Saline Bullets; (15) 3x3 Gauze; 15 pr Gloves Cleanser: Soap and Water 1 x Per Day/30 Days Discharge Instructions: Gently cleanse wound with antibacterial soap, rinse and pat dry prior to dressing wounds Cleanser: Wound Cleanser 1 x Per Day/30 Days Discharge Instructions: Wash your hands with soap and water. Remove old dressing, discard into plastic bag and place into trash. Cleanse the wound with Wound Cleanser prior to applying a clean dressing using gauze sponges, not tissues or cotton balls. Do not scrub or use excessive force. Pat dry using gauze sponges, not tissue or cotton balls. Prim Dressing: Gauze 1 x Per Day/30 Days ary Discharge Instructions: moistened with Dakins Solution until blastx arrives Prim Dressing: BlastX, 1 (oz), tube 1 x Per Day/30 Days ary Discharge Instructions: start once it arrives Secondary Dressing: Zetuvit Plus 4x4 (in/in) 1 x Per Day/30 Days Electronic Signature(s) Signed: 10/28/2022 12:46:15 PM By: Geralyn Corwin DO Previous Signature: 10/28/2022 10:12:26 AM Version By: Geralyn Corwin DO Entered By: Geralyn Corwin on 10/28/2022 12:46:00 -------------------------------------------------------------------------------- Problem List Details Patient Name: Date of Service: Edwin Olson 10/28/2022 8:45 A M Medical Record Number: [id number] Patient Account Number: [id number] Date of Birth/Sex: Treating RN: August 02, 1940 (82 y.o. Edwin Olson Primary Care Provider: Aram Beecham Other Clinician: Referring Provider: Treating Provider/Extender: Aldona Bar in Treatment: 0 Active Problems ICD-10 Encounter Code Description Active Date  MDM Diagnosis S01.80XA Unspecified open wound of other part of head, initial encounter 10/28/2022 No Yes L98.492 Non-pressure chronic ulcer of skin of other sites with fat layer exposed 10/28/2022 No Yes E11.622 Type 2 diabetes mellitus with other skin ulcer 10/28/2022 No Yes T79.8XXA Other early complications of trauma, initial encounter 10/28/2022 No Yes Z79.4 Long term (current) use of insulin 10/28/2022 No Yes Dorow, JEFERSON BOOZER ([id number]) [id number] Page 4 of 7 Inactive Problems Resolved Problems Electronic Signature(s) Signed: 10/28/2022 3:04:48 PM By: Geralyn Corwin DO Previous Signature: 10/28/2022 10:12:26 AM Version By: Geralyn Corwin DO Entered By: Geralyn Corwin on 10/28/2022 14:44:10 -------------------------------------------------------------------------------- Progress Note Details Patient Name: Date of Service: Edwin Olson 10/28/2022 8:45 A M Medical Record Number: [id number] Patient Account Number: [id number] Date of Birth/Sex: Treating RN: 10-05-1940 (82 y.o. Edwin Olson) Jettie Pagan, [place]  Primary Care Provider: Aram Beecham Other Clinician: Referring Provider: Treating Provider/Extender: Aldona Bar in Treatment: 0 Subjective Chief Complaint Information obtained from Patient 10/28/2022; scalp wound History of Present Illness (HPI) 10/28/2022 [name] is an 82 year old male with a past medical history of CAD, controlled type 2 diabetes that presents the clinic for a 2-week history of open wound to the scalp. On 10/08/2022 he visited the ED after experiencing a fall. He had a laceration to the scalp and had 13 staples placed. He had these removed on 4/19 and the wound dehisced. He has been keeping the area covered. Of note when he had a CT scan it was noted to have an acute subdural hematoma over the right cerebral convexity, falx and tentorium. On repeat 6 hours later this was noted to be stable. He is scheduled to have an  MRI later this month. He currently denies any recent loss of consciousness, dizziness or falls. Patient History Allergies clonidine, dapagliflozin, glipizide, atorvastatin, Crestor, doxycycline hyclate, fenofibrate, isosorbide, meclizine, Percocet, Ultram, amlodipine, atenolol, Plavix, lisinopril, losartan, metaxalone, penicillin Social History Never smoker, Marital Status - Married, Alcohol Use - Never, Drug Use - No History, Caffeine Use - Daily. Medical History Cardiovascular Patient has history of Congestive Heart Failure, Coronary Artery Disease, Hypertension Endocrine Patient has history of Type II Diabetes Patient is treated with Insulin, Oral Agents. Review of Systems (ROS) Integumentary (Skin) Complains or has symptoms of Wounds. Objective Constitutional Vitals Time Taken: 8:44 AM, Height: 71 in, Source: Stated, Weight: 168 lbs, Source: Stated, BMI: 23.4, Temperature: 98 F, Pulse: 96 bpm, Respiratory Rate: 18 breaths/min, Blood Pressure: 131/70 mmHg. General Notes: T the posterior aspect of the head in the occipital region there is an open wound with eschar, nonviable tissue and scant granulation tissue. o There is some undermining from the 11 to 2 o'clock position. This does not probe to the calvarium. Integumentary (Hair, Skin) Wound #1 status is Open. Original cause of wound was Trauma. The date acquired was: 10/08/2022. The wound is located on the Head - occiput. The wound measures 2cm length x 1.5cm width x 0.3cm depth; 2.356cm^2 area and 0.707cm^3 volume. There is Fat Layer (Subcutaneous Tissue) exposed. There is no tunneling or undermining noted. There is a medium amount of serosanguineous drainage noted. There is small (1-33%) pink granulation within the wound bed. There is a large (67-100%) amount of necrotic tissue within the wound bed including Eschar and Adherent Slough. HUGHES, WYNDHAM ([id number]) [id number] Page 5 of  7 Assessment Active Problems ICD-10 Unspecified open wound of other part of head, initial encounter Non-pressure chronic ulcer of skin of other sites with fat layer exposed Type 2 diabetes mellitus with other skin ulcer Other early complications of trauma, initial encounter Patient presents with a 2-week history of nonhealing wound to the scalp secondary to a fall. I debrided nonviable tissue. At this time I recommended using blast X and collagen daily. By the time this arrives he can use Dakin's wet-to-dry dressings. Follow-up in 1 week. Patient knows to call with any questions or concerns. Procedures Wound #1 Pre-procedure diagnosis of Wound #1 is a Trauma, Other located on the Head - occiput . There was a Selective/Open Wound Non-Viable Tissue Debridement with a total area of 2.36 sq cm performed by Geralyn Corwin, MD. With the following instrument(s): Curette to remove Viable and Non-Viable tissue/material. Material removed includes Eschar and Slough and after achieving pain control using Lidocaine 4% T opical Solution. No specimens were taken. A  time out was conducted at 09:09, prior to the start of the procedure. A Moderate amount of bleeding was controlled with Silver Nitrate. The procedure was tolerated well with a pain level of 0 throughout and a pain level of 0 following the procedure. Post Debridement Measurements: 2cm length x 1.5cm width x 0.3cm depth; 0.707cm^3 volume. Character of Wound/Ulcer Post Debridement is improved. Post procedure Diagnosis Wound #1: Same as Pre-Procedure Plan Follow-up Appointments: Return Appointment in 1 week. Bathing/ Shower/ Hygiene: Other: - do not get wet Anesthetic (Use 'Patient Medications' Section for Anesthetic Order Entry): Lidocaine applied to wound bed WOUND #1: - Head - occiput Wound Laterality: Cleanser: Byram Ancillary Kit - 15 Day Supply (DME) (Generic) 1 x Per Day/30 Days Discharge Instructions: Use supplies as instructed; Kit  contains: (15) Saline Bullets; (15) 3x3 Gauze; 15 pr Gloves Cleanser: Soap and Water 1 x Per Day/30 Days Discharge Instructions: Gently cleanse wound with antibacterial soap, rinse and pat dry prior to dressing wounds Cleanser: Wound Cleanser 1 x Per Day/30 Days Discharge Instructions: Wash your hands with soap and water. Remove old dressing, discard into plastic bag and place into trash. Cleanse the wound with Wound Cleanser prior to applying a clean dressing using gauze sponges, not tissues or cotton balls. Do not scrub or use excessive force. Pat dry using gauze sponges, not tissue or cotton balls. Prim Dressing: Gauze 1 x Per Day/30 Days ary Discharge Instructions: moistened with Dakins Solution until blastx arrives Prim Dressing: BlastX, 1 (oz), tube 1 x Per Day/30 Days ary Discharge Instructions: start once it arrives Secondary Dressing: Zetuvit Plus 4x4 (in/in) 1 x Per Day/30 Days 1. In office sharp debridement 2. Blast X and collagen daily 3. Follow-up in 1 week Electronic Signature(s) Signed: 10/28/2022 12:46:15 PM By: Geralyn Corwin DO Previous Signature: 10/28/2022 10:12:26 AM Version By: Geralyn Corwin DO Entered By: Geralyn Corwin on 10/28/2022 12:45:45 -------------------------------------------------------------------------------- ROS/PFSH Details Patient Name: Date of Service: Edwin Olson 10/28/2022 8:45 A M Medical Record Number: [id number] Patient Account Number: [id number] Date of Birth/Sex: Treating RN: March 17, 1941 (82 y.o. Edwin Olson Primary Care Provider: Aram Beecham Other Clinician: Referring Provider: Treating Provider/Extender: Daunte, Oestreich ([id number]) [id number] Page 6 of 7 Weeks in Treatment: 0 Integumentary (Skin) Complaints and Symptoms: Positive for: Wounds Cardiovascular Medical History: Positive for: Congestive Heart Failure; Coronary Artery Disease;  Hypertension Endocrine Medical History: Positive for: Type II Diabetes Time with diabetes: 30 Treated with: Insulin, Oral agents Immunizations Pneumococcal Vaccine: Received Pneumococcal Vaccination: Yes Received Pneumococcal Vaccination On or After 60th Birthday: Yes Implantable Devices None Family and Social History Never smoker; Marital Status - Married; Alcohol Use: Never; Drug Use: No History; Caffeine Use: Daily Electronic Signature(s) Signed: 10/28/2022 9:40:32 AM By: Yevonne Pax RN Signed: 10/28/2022 10:12:26 AM By: Geralyn Corwin DO Entered By: Yevonne Pax on 10/28/2022 08:48:19 -------------------------------------------------------------------------------- SuperBill Details Patient Name: Date of Service: Edwin Olson 10/28/2022 Medical Record Number: [id number] Patient Account Number: [id number] Date of Birth/Sex: Treating RN: [birth year] (82 y.o. Edwin Olson) Yevonne Pax Primary Care Provider: Aram Beecham Other Clinician: Referring Provider: Treating Provider/Extender: Aldona Bar in Treatment: 0 Diagnosis Coding ICD-10 Codes Code Description S01.80XA Unspecified open wound of other part of head, initial encounter L98.492 Non-pressure chronic ulcer of skin of other sites with fat layer exposed E11.622 Type 2 diabetes mellitus with other skin ulcer T79.8XXA Other early complications of trauma, initial encounter Z79.4 Long term (current) use of insulin Facility Procedures : CPT4 Code: [id number]  Description: [phone] - WOUND CARE VISIT-LEV 2 EST PT Modifier: Quantity: 1 : CPT4 Code: [id number] Description: 97597 - DEBRIDE WOUND 1ST 20 SQ CM OR < ICD-10 Diagnosis Description S01.80XA Unspecified open wound of other part of head, initial encounter L98.492 Non-pressure chronic ulcer of skin of other sites with fat layer exposed Modifier: Quantity: 1 Physician Procedures : CPT4 Code Description Modifier [id number] - WC PHYS LEVEL 4 - NEW PT  DOWELL, HOON ([id number]) [id number] ICD-10 Diagnosis Description S01.80XA Unspecified open wound of other part of head, initial encounter L98.492  Non-pressure chronic ulcer of skin of other sites with fat layer exposed E11.622 Type 2 diabetes mellitus with other skin ulcer Z79.4 Long term (current) use of insulin Quantity: 1 817.pdf Page 7 of 7 : [id number] - WC PHYS DEBR WO ANESTH 20 SQ CM 1 ICD-10 Diagnosis Description S01.80XA Unspecified open wound of other part of head, initial encounter L98.492 Non-pressure chronic ulcer of skin of other sites with fat layer exposed Quantity: Electronic Signature(s) Signed: 10/28/2022 3:04:48 PM By: Geralyn Corwin DO Previous Signature: 10/28/2022 12:52:09 PM Version By: Yevonne Pax RN Previous Signature: 10/28/2022 2:21:04 PM Version By: Geralyn Corwin DO Previous Signature: 10/28/2022 12:46:15 PM Version By: Geralyn Corwin DO Previous Signature: 10/28/2022 10:12:26 AM Version By: Geralyn Corwin DO Entered By: Geralyn Corwin on 10/28/2022 14:44:32

## 2022-10-29 NOTE — Progress Notes (Addendum)
ADOM, SCHOENECK ([id number]) [id number] Page 1 of 7 Visit Report for 10/28/2022 Allergy List Details Patient Name: Date of Service: Edwin Olson, Edwin Olson 10/28/2022 8:45 A M Medical Record Number: [id number] Patient Account Number: [id number] Date of Birth/Sex: Treating RN: [birth year] (82 y.o. [name]) Yevonne Pax Primary Care Natalie Mceuen: Aram Beecham Other Clinician: Referring Cain Fitzhenry: Treating Travis Purk/Extender: Aldona Bar in Treatment: 0 Allergies Active Allergies clonidine dapagliflozin glipizide atorvastatin Crestor doxycycline hyclate fenofibrate isosorbide meclizine Percocet Ultram amlodipine atenolol Plavix lisinopril losartan metaxalone penicillin Allergy Notes Electronic Signature(s) Signed: 10/28/2022 9:40:32 AM By: Yevonne Pax RN Entered By: Yevonne Pax on 10/28/2022 09:02:32 -------------------------------------------------------------------------------- Arrival Information Details Patient Name: Date of Service: Edwin Olson 10/28/2022 8:45 A M Medical Record Number: [id number] Patient Account Number: [id number] Date of Birth/Sex: Treating RN: September 01, 1940 (82 y.o. [name]) Yevonne Pax Primary Care Vanette Noguchi: Aram Beecham Other Clinician: Referring Natalye Kott: Treating Kaleisha Bhargava/Extender: Aldona Bar in Treatment: 0 Visit Information Patient Arrived: Ambulatory Arrival Time: 08:44 SHANKAR, SILBER ([id number]) [id number] Page 2 of 7 Accompanied By: self Transfer Assistance: None Patient Identification Verified: Yes Secondary Verification Process Completed: Yes Patient Requires Transmission-Based Precautions: No Patient Has Alerts: No Electronic Signature(s) Signed: 10/28/2022 9:40:32 AM By: Yevonne Pax RN Entered By: Yevonne Pax on 10/28/2022 08:44:22 -------------------------------------------------------------------------------- Clinic Level of Care  Assessment Details Patient Name: Date of Service: Edwin Olson, Edwin Olson 10/28/2022 8:45 A M Medical Record Number: [id number] Patient Account Number: [id number] Date of Birth/Sex: Treating RN: 01-10-1941 (82 y.o. [name]) Yevonne Pax Primary Care Jovani Colquhoun: Aram Beecham Other Clinician: Referring Kelsea Mousel: Treating Kolston Lacount/Extender: Aldona Bar in Treatment: 0 Clinic Level of Care Assessment Items TOOL 1 Quantity Score X- 1 0 Use when EandM and Procedure is performed on INITIAL visit ASSESSMENTS - Nursing Assessment / Reassessment X- 1 20 General Physical Exam (combine w/ comprehensive assessment (listed just below) when performed on new pt. evals) X- 1 25 Comprehensive Assessment (HX, ROS, Risk Assessments, Wounds Hx, etc.) ASSESSMENTS - Wound and Skin Assessment / Reassessment []  - 0 Dermatologic / Skin Assessment (not related to wound area) ASSESSMENTS - Ostomy and/or Continence Assessment and Care []  - 0 Incontinence Assessment and Management []  - 0 Ostomy Care Assessment and Management (repouching, etc.) PROCESS - Coordination of Care X - Simple Patient / Family Education for ongoing care 1 15 []  - 0 Complex (extensive) Patient / Family Education for ongoing care []  - 0 Staff obtains Chiropractor, Records, T Results / Process Orders est []  - 0 Staff telephones HHA, Nursing Homes / Clarify orders / etc []  - 0 Routine Transfer to another Facility (non-emergent condition) []  - 0 Routine Hospital Admission (non-emergent condition) X- 1 15 New Admissions / Manufacturing engineer / Ordering NPWT Apligraf, etc. , []  - 0 Emergency Hospital Admission (emergent condition) PROCESS - Special Needs []  - 0 Pediatric / Minor Patient Management []  - 0 Isolation Patient Management []  - 0 Hearing / Language / Visual special needs []  - 0 Assessment of Community assistance (transportation, D/C planning, etc.) []  - 0 Additional assistance / Altered mentation []   - 0 Support Surface(s) Assessment (bed, cushion, seat, etc.) INTERVENTIONS - Miscellaneous []  - 0 External ear exam []  - 0 Patient Transfer (multiple staff / Nurse, adult / Similar devices) []  - 0 Simple Staple / Suture removal (25 or less) []  - 0 Complex Staple / Suture removal (26 or more) Krizan, Eliyah Olson ([id number]) [id number] Page 3 of 7 []  - 0 Hypo/Hyperglycemic Management (do not check if  billed separately) []  - 0 Ankle / Brachial Index (ABI) - do not check if billed separately Has the patient been seen at the hospital within the last three years: Yes Total Score: 75 Level Of Care: New/Established - Level 2 Electronic Signature(s) Unsigned Entered ByYevonne Pax on 10/28/2022 12:51:59 -------------------------------------------------------------------------------- Encounter Discharge Information Details Patient Name: Date of Service: Edwin Olson, Edwin Olson 10/28/2022 8:45 A M Medical Record Number: [id number] Patient Account Number: [id number] Date of Birth/Sex: Treating RN: 1941/03/27 (82 y.o. [name]) Yevonne Pax Primary Care Daley Mooradian: Aram Beecham Other Clinician: Referring Trendon Zaring: Treating Daqwan Dougal/Extender: Aldona Bar in Treatment: 0 Encounter Discharge Information Items Post Procedure Vitals Discharge Condition: Stable Temperature (F): 98 Ambulatory Status: Ambulatory Pulse (bpm): 96 Discharge Destination: Home Respiratory Rate (breaths/min): 18 Transportation: Private Auto Blood Pressure (mmHg): 131/70 Accompanied By: self Schedule Follow-up Appointment: Yes Clinical Summary of Care: Electronic Signature(s) Signed: 10/28/2022 12:59:07 PM By: Yevonne Pax RN Entered By: Yevonne Pax on 10/28/2022 12:59:07 -------------------------------------------------------------------------------- Lower Extremity Assessment Details Patient Name: Date of Service: Edwin Olson, Edwin Olson 10/28/2022 8:45 A M Medical Record Number:  [id number] Patient Account Number: [id number] Date of Birth/Sex: Treating RN: Sep 09, 1940 (82 y.o. [name]) Yevonne Pax Primary Care Ghislaine Harcum: Aram Beecham Other Clinician: Referring Bunyan Brier: Treating Erykah Lippert/Extender: Aldona Bar in Treatment: 0 Electronic Signature(s) Signed: 10/28/2022 9:40:32 AM By: Yevonne Pax RN Entered By: Yevonne Pax on 10/28/2022 08:57:53 -------------------------------------------------------------------------------- Multi Wound Chart Details Patient Name: Date of Service: Edwin Olson 10/28/2022 8:45 A M Medical Record Number: [id number] Patient Account Number: [id number] Date of Birth/Sex: Treating RN: 1940/10/27 (82 y.o. [name] Primary Care Jonathen Rathman: Aram Beecham Other Clinician: Referring Ziare Orrick: Treating Benito Lemmerman/Extender: Aldona Bar in Treatment: 0 Vital Signs Height(in): 71 Pulse(bpm): 96 Weight(lbs): 168 Blood Pressure(mmHg): 131/70 Body Mass Index(BMI): 23.4 Temperature(F): 98 Respiratory Rate(breaths/min): 18 Gardiner, Renley Olson ([id number]) [id number] Page 4 of 7 [1:Photos:] [N/A:N/A] Head - occiput N/A N/A Wound Location: Trauma N/A N/A Wounding Event: Trauma, Other N/A N/A Primary Etiology: Congestive Heart Failure, Coronary N/A N/A Comorbid History: Artery Disease, Hypertension, Type II Diabetes 10/08/2022 N/A N/A Date Acquired: 0 N/A N/A Weeks of Treatment: Open N/A N/A Wound Status: No N/A N/A Wound Recurrence: 2x1.5x0.3 N/A N/A Measurements L x W x D (cm) 2.356 N/A N/A A (cm) : rea 0.707 N/A N/A Volume (cm) : Full Thickness Without Exposed N/A N/A Classification: Support Structures Medium N/A N/A Exudate A mount: Serosanguineous N/A N/A Exudate Type: red, brown N/A N/A Exudate Color: Small (1-33%) N/A N/A Granulation A mount: Pink N/A N/A Granulation Quality: Large (67-100%) N/A N/A Necrotic A mount: Eschar,  Adherent Slough N/A N/A Necrotic Tissue: Fat Layer (Subcutaneous Tissue): Yes N/A N/A Exposed Structures: Fascia: No Tendon: No Muscle: No Joint: No Bone: No None N/A N/A Epithelialization: Debridement - Selective/Open Wound N/A N/A Debridement: Pre-procedure Verification/Time Out 09:09 N/A N/A Taken: Lidocaine 4% T opical Solution N/A N/A Pain Control: Necrotic/Eschar, Slough N/A N/A Tissue Debrided: Non-Viable Tissue N/A N/A Level: 2.36 N/A N/A Debridement A (sq cm): rea Curette N/A N/A Instrument: Moderate N/A N/A Bleeding: Silver Nitrate N/A N/A Hemostasis A chieved: 0 N/A N/A Procedural Pain: 0 N/A N/A Post Procedural Pain: Procedure was tolerated well N/A N/A Debridement Treatment Response: 2x1.5x0.3 N/A N/A Post Debridement Measurements L x W x D (cm) 0.707 N/A N/A Post Debridement Volume: (cm) Debridement N/A N/A Procedures Performed: Treatment Notes Electronic Signature(s) Signed: 10/28/2022 10:12:26 AM By: Geralyn Corwin DO Entered By: Geralyn Corwin on 10/28/2022  09:22:54 -------------------------------------------------------------------------------- Multi-Disciplinary Care Plan Details Patient Name: Date of Service: Edwin Olson, Edwin Olson 10/28/2022 8:45 A M Medical Record Number: [id number] Patient Account Number: [id number] Date of Birth/Sex: Treating RN: 03/18/1941 (82 y.o. [name]) Yevonne Pax Primary Care Jo-Ann Johanning: Aram Beecham Other Clinician: Referring Sully Dyment: Treating Olson Edwin Olson/Extender: Aldona Bar in Treatment: 0 Edwin Olson, Edwin Olson ([id number]) [id number] Page 5 of 7 Active Inactive Necrotic Tissue Nursing Diagnoses: Knowledge deficit related to management of necrotic/devitalized tissue Goals: Necrotic/devitalized tissue will be minimized in the wound bed Date Initiated: 10/28/2022 Target Resolution Date: 11/28/2022 Goal Status: Active Interventions: Assess patient pain level pre-,  during and post procedure and prior to discharge Notes: Wound/Skin Impairment Nursing Diagnoses: Knowledge deficit related to ulceration/compromised skin integrity Goals: Patient/caregiver will verbalize understanding of skin care regimen Date Initiated: 10/28/2022 Target Resolution Date: 11/28/2022 Goal Status: Active Ulcer/skin breakdown will have a volume reduction of 30% by week 4 Date Initiated: 10/28/2022 Target Resolution Date: 11/28/2022 Goal Status: Active Ulcer/skin breakdown will have a volume reduction of 50% by week 8 Date Initiated: 10/28/2022 Target Resolution Date: 12/29/2022 Goal Status: Active Ulcer/skin breakdown will have a volume reduction of 80% by week 12 Date Initiated: 10/28/2022 Target Resolution Date: 01/28/2023 Goal Status: Active Ulcer/skin breakdown will heal within 14 weeks Date Initiated: 10/28/2022 Target Resolution Date: 02/28/2023 Goal Status: Active Interventions: Assess patient/caregiver ability to obtain necessary supplies Assess patient/caregiver ability to perform ulcer/skin care regimen upon admission and as needed Assess ulceration(s) every visit Notes: Electronic Signature(s) Signed: 10/28/2022 9:40:32 AM By: Yevonne Pax RN Entered By: Yevonne Pax on 10/28/2022 09:12:13 -------------------------------------------------------------------------------- Pain Assessment Details Patient Name: Date of Service: Edwin Olson 10/28/2022 8:45 A M Medical Record Number: [id number] Patient Account Number: [id number] Date of Birth/Sex: Treating RN: 20-Aug-1940 (82 y.o. [name] Primary Care Makella Buckingham: Aram Beecham Other Clinician: Referring Yetunde Leis: Treating Damarie Schoolfield/Extender: Aldona Bar in Treatment: 0 Active Problems Location of Pain Severity and Description of Pain Patient Has Paino No Site Locations Edwin Olson, Edwin Olson ([id number]) [id number] Page 6 of 7 Pain Management and Medication Current  Pain Management: Electronic Signature(s) Signed: 10/28/2022 9:40:32 AM By: Yevonne Pax RN Entered By: Yevonne Pax on 10/28/2022 08:44:35 -------------------------------------------------------------------------------- Patient/Caregiver Education Details Patient Name: Date of Service: Edwin Olson 5/1/2024andnbsp8:45 A M Medical Record Number: [id number] Patient Account Number: [id number] Date of Birth/Gender: Treating RN: June 17, 1941 (82 y.o. [name]) Yevonne Pax Primary Care Physician: Aram Beecham Other Clinician: Referring Physician: Treating Physician/Extender: Aldona Bar in Treatment: 0 Education Assessment Education Provided To: Patient Education Topics Provided Welcome T [facility]New [facility] Packet: o Handouts: Fall Prevention and Safe Transfers, Welcome T [facility] o Methods: Explain/Verbal Responses: State content correctly Electronic Signature(s) Signed: 10/28/2022 9:40:32 AM By: Yevonne Pax RN Entered By: Yevonne Pax on 10/28/2022 09:12:33 -------------------------------------------------------------------------------- Wound Assessment Details Patient Name: Date of Service: Edwin Olson 10/28/2022 8:45 A M Medical Record Number: [id number] Patient Account Number: [id number] Date of Birth/Sex: Treating RN: August 10, 1940 (82 y.o. [name]) Yevonne Pax Primary Care Amunique Neyra: Aram Beecham Other Clinician: Referring Kennetha Pearman: Treating Takayla Baillie/Extender: Aldona Bar in Treatment: 0 Wound Status Wound Number: 1 Primary Trauma, Other Etiology: Wound Location: Head - occiput Wound Open Wounding Event: Trauma Status: Date Acquired: 10/08/2022 Comorbid Congestive Heart Failure, Coronary Artery Disease, Weeks Of Treatment: 0 Marvin, Edwin Olson ([id number]) [id number] Page 7 of 7 Weeks Of Treatment: 0 History: Hypertension, Type II Diabetes Clustered Wound:  No Photos Wound Measurements Length: (  cm) 2 Width: (cm) 1.5 Depth: (cm) 0.3 Area: (cm) 2.356 Volume: (cm) 0.707 % Reduction in Area: % Reduction in Volume: Epithelialization: None Tunneling: No Undermining: No Wound Description Classification: Full Thickness Without Exposed Support Structures Exudate Amount: Medium Exudate Type: Serosanguineous Exudate Color: red, brown Foul Odor After Cleansing: No Slough/Fibrino Yes Wound Bed Granulation Amount: Small (1-33%) Exposed Structure Granulation Quality: Pink Fascia Exposed: No Necrotic Amount: Large (67-100%) Fat Layer (Subcutaneous Tissue) Exposed: Yes Necrotic Quality: Eschar, Adherent Slough Tendon Exposed: No Muscle Exposed: No Joint Exposed: No Bone Exposed: No Electronic Signature(s) Signed: 10/28/2022 9:40:32 AM By: Yevonne Pax RN Entered By: Yevonne Pax on 10/28/2022 08:57:32 -------------------------------------------------------------------------------- Vitals Details Patient Name: Date of Service: Edwin Olson 10/28/2022 8:45 A M Medical Record Number: [id number] Patient Account Number: [id number] Date of Birth/Sex: Treating RN: Aug 03, 1940 (82 y.o. [name]) Yevonne Pax Primary Care Alica Shellhammer: Aram Beecham Other Clinician: Referring Noelia Lenart: Treating Raeleen Winstanley/Extender: Aldona Bar in Treatment: 0 Vital Signs Time Taken: 08:44 Temperature (F): 98 Height (in): 71 Pulse (bpm): 96 Source: Stated Respiratory Rate (breaths/min): 18 Weight (lbs): 168 Blood Pressure (mmHg): 131/70 Source: Stated Reference Range: 80 - 120 mg / dl Body Mass Index (BMI): 23.4 Electronic Signature(s) Signed: 10/28/2022 9:40:32 AM By: Yevonne Pax RN Entered By: Yevonne Pax on 10/28/2022 08:45:45

## 2022-11-02 ENCOUNTER — Ambulatory Visit
Admission: RE | Admit: 2022-11-02 | Discharge: 2022-11-02 | Disposition: A | Discharge status: Home / Self Care | Payer: Medicare Other | Source: Ambulatory Visit | Attending: Neurosurgery | Admitting: Neurosurgery

## 2022-11-02 DIAGNOSIS — S065XAA Traumatic subdural hemorrhage with loss of consciousness status unknown, initial encounter: Secondary | ICD-10-CM | POA: Diagnosis not present

## 2022-11-04 ENCOUNTER — Encounter: Payer: Medicare Other | Admitting: Internal Medicine

## 2022-11-04 DIAGNOSIS — E11622 Type 2 diabetes mellitus with other skin ulcer: Secondary | ICD-10-CM | POA: Diagnosis not present

## 2022-11-04 NOTE — Progress Notes (Signed)
KEYSHAUN, Olson ([id number]) [id number] Page 1 of 7 Visit Report for 11/04/2022 Arrival Information Details Patient Name: Date of Service: Edwin Olson, Edwin Olson 11/04/2022 8:00 A M Medical Record Number: [id number] Patient Account Number: [id number] Date of Birth/Sex: Treating RN: January 13, 1941 (82 y.o. [name]) Yevonne Pax Primary Care Alekzander Cardell: Aram Beecham Other Clinician: Referring Zaliyah Meikle: Treating Karoline Fleer/Extender: RO BSO N, MICHA EL Cathlean Sauer in Treatment: 1 Visit Information History Since Last Visit Added or deleted any medications: No Patient Arrived: Ambulatory Any new allergies or adverse reactions: No Arrival Time: 08:12 Had a fall or experienced change in No Accompanied By: self activities of daily living that may affect Transfer Assistance: None risk of falls: Patient Identification Verified: Yes Signs or symptoms of abuse/neglect since last visito No Secondary Verification Process Completed: Yes Hospitalized since last visit: No Patient Requires Transmission-Based Precautions: No Implantable device outside of the clinic excluding No Patient Has Alerts: No cellular tissue based products placed in the center since last visit: Has Dressing in Place as Prescribed: Yes Pain Present Now: No Electronic Signature(s) Signed: 11/04/2022 9:53:20 AM By: Yevonne Pax RN Entered By: Yevonne Pax on 11/04/2022 08:12:56 -------------------------------------------------------------------------------- Clinic Level of Care Assessment Details Patient Name: Date of Service: Edwin Olson, Edwin Olson 11/04/2022 8:00 A M Medical Record Number: [id number] Patient Account Number: [id number] Date of Birth/Sex: Treating RN: 09/07/1940 (82 y.o. [name]) Yevonne Pax Primary Care Anisah Kuck: Aram Beecham Other Clinician: Referring Marilin Kofman: Treating Genie Wenke/Extender: RO BSO N, MICHA EL Cathlean Sauer in Treatment: 1 Clinic Level of Care Assessment Items TOOL 4  Quantity Score X- 1 0 Use when only an EandM is performed on FOLLOW-UP visit ASSESSMENTS - Nursing Assessment / Reassessment X- 1 10 Reassessment of Co-morbidities (includes updates in patient status) X- 1 5 Reassessment of Adherence to Treatment Plan ASSESSMENTS - Wound and Skin A ssessment / Reassessment X - Simple Wound Assessment / Reassessment - one wound 1 5 []  - 0 Complex Wound Assessment / Reassessment - multiple wounds []  - 0 Dermatologic / Skin Assessment (not related to wound area) ASSESSMENTS - Focused Assessment []  - 0 Circumferential Edema Measurements - multi extremities []  - 0 Nutritional Assessment / Counseling / Intervention []  - 0 Lower Extremity Assessment (monofilament, tuning fork, pulses) []  - 0 Peripheral Arterial Disease Assessment (using hand held doppler) ASSESSMENTS - Ostomy and/or Continence Assessment and Care []  - 0 Incontinence Assessment and Management []  - 0 Ostomy Care Assessment and Management (repouching, etc.) PROCESS - Coordination of Care X - Simple Patient / Family Education for ongoing care 1 15 Edwin Olson, Edwin Olson ([id number]) [id number] Page 2 of 7 []  - 0 Complex (extensive) Patient / Family Education for ongoing care []  - 0 Staff obtains Chiropractor, Records, T Results / Process Orders est []  - 0 Staff telephones HHA, Nursing Homes / Clarify orders / etc []  - 0 Routine Transfer to another Facility (non-emergent condition) []  - 0 Routine Hospital Admission (non-emergent condition) []  - 0 New Admissions / Manufacturing engineer / Ordering NPWT Apligraf, etc. , []  - 0 Emergency Hospital Admission (emergent condition) X- 1 10 Simple Discharge Coordination []  - 0 Complex (extensive) Discharge Coordination PROCESS - Special Needs []  - 0 Pediatric / Minor Patient Management []  - 0 Isolation Patient Management []  - 0 Hearing / Language / Visual special needs []  - 0 Assessment of Community assistance  (transportation, D/C planning, etc.) []  - 0 Additional assistance / Altered mentation []  - 0 Support Surface(s) Assessment (bed, cushion, seat, etc.) INTERVENTIONS -  Wound Cleansing / Measurement X - Simple Wound Cleansing - one wound 1 5 []  - 0 Complex Wound Cleansing - multiple wounds X- 1 5 Wound Imaging (photographs - any number of wounds) []  - 0 Wound Tracing (instead of photographs) X- 1 5 Simple Wound Measurement - one wound []  - 0 Complex Wound Measurement - multiple wounds INTERVENTIONS - Wound Dressings X - Small Wound Dressing one or multiple wounds 1 10 []  - 0 Medium Wound Dressing one or multiple wounds []  - 0 Large Wound Dressing one or multiple wounds []  - 0 Application of Medications - topical []  - 0 Application of Medications - injection INTERVENTIONS - Miscellaneous []  - 0 External ear exam []  - 0 Specimen Collection (cultures, biopsies, blood, body fluids, etc.) []  - 0 Specimen(s) / Culture(s) sent or taken to Lab for analysis []  - 0 Patient Transfer (multiple staff / Nurse, adult / Similar devices) []  - 0 Simple Staple / Suture removal (25 or less) []  - 0 Complex Staple / Suture removal (26 or more) []  - 0 Hypo / Hyperglycemic Management (close monitor of Blood Glucose) []  - 0 Ankle / Brachial Index (ABI) - do not check if billed separately X- 1 5 Vital Signs Has the patient been seen at the hospital within the last three years: Yes Total Score: 75 Level Of Care: New/Established - Level 2 Electronic Signature(s) Signed: 11/04/2022 9:53:20 AM By: Yevonne Pax RN Entered By: Yevonne Pax on 11/04/2022 08:30:20 Edwin Olson ([id number]) [id number] Page 3 of 7 -------------------------------------------------------------------------------- Encounter Discharge Information Details Patient Name: Date of Service: Edwin Olson, Edwin Olson 11/04/2022 8:00 A M Medical Record Number: [id number] Patient Account Number: [id number] Date  of Birth/Sex: Treating RN: [birth year] (82 y.o. [name]) Yevonne Pax Primary Care Johara Lodwick: Aram Beecham Other Clinician: Referring Geddy Boydstun: Treating Lauri Till/Extender: RO BSO N, MICHA EL Cathlean Sauer in Treatment: 1 Encounter Discharge Information Items Discharge Condition: Stable Ambulatory Status: Ambulatory Discharge Destination: Home Transportation: Private Auto Accompanied By: self Schedule Follow-up Appointment: Yes Clinical Summary of Care: Electronic Signature(s) Signed: 11/04/2022 9:53:20 AM By: Yevonne Pax RN Entered By: Yevonne Pax on 11/04/2022 08:31:19 -------------------------------------------------------------------------------- Lower Extremity Assessment Details Patient Name: Date of Service: Edwin Olson, Edwin Olson 11/04/2022 8:00 A M Medical Record Number: [id number] Patient Account Number: [id number] Date of Birth/Sex: Treating RN: 22-Dec-1940 (82 y.o. [name]) Yevonne Pax Primary Care Olivette Beckmann: Aram Beecham Other Clinician: Referring Eldena Dede: Treating Jalik Gellatly/Extender: RO BSO N, MICHA EL Cathlean Sauer in Treatment: 1 Electronic Signature(s) Signed: 11/04/2022 9:53:20 AM By: Yevonne Pax RN Entered By: Yevonne Pax on 11/04/2022 08:16:51 -------------------------------------------------------------------------------- Multi Wound Chart Details Patient Name: Date of Service: Edwin Olson 11/04/2022 8:00 A M Medical Record Number: [id number] Patient Account Number: [id number] Date of Birth/Sex: Treating RN: Aug 20, 1940 (82 y.o. [name]) Yevonne Pax Primary Care Yarrow Linhart: Aram Beecham Other Clinician: Referring Shinita Mac: Treating Ikeisha Blumberg/Extender: RO BSO Dorris Carnes, MICHA EL Cathlean Sauer in Treatment: 1 [1:Photos:] [N/A:N/A] Midline Head - occiput N/A N/A Wound Location: Trauma N/A N/A Wounding Event: Trauma, Other N/A N/A Primary Etiology: Congestive Heart Failure, Coronary N/A N/A Comorbid History: Artery Disease, Hypertension, Type  II Diabetes 10/08/2022 N/A N/A Date Acquired: 1 N/A N/A Weeks of Treatment: Open N/A N/A Wound StatusJEVONTAE, DELSORDO ([id number]) [id number] Page 4 of 7 No N/A N/A Wound Recurrence: 0.6x0.9x0.1 N/A N/A Measurements L x W x D (cm) 0.424 N/A N/A A (cm) : rea 0.042 N/A N/A Volume (cm) : 82.00% N/A N/A % Reduction in Area: 94.10%  N/A N/A % Reduction in Volume: Full Thickness Without Exposed N/A N/A Classification: Support Structures Medium N/A N/A Exudate A mount: Serosanguineous N/A N/A Exudate Type: red, brown N/A N/A Exudate Color: Small (1-33%) N/A N/A Granulation Amount: Pink N/A N/A Granulation Quality: Large (67-100%) N/A N/A Necrotic Amount: Eschar, Adherent Slough N/A N/A Necrotic Tissue: Fat Layer (Subcutaneous Tissue): Yes N/A N/A Exposed Structures: Fascia: No Tendon: No Muscle: No Joint: No Bone: No None N/A N/A Epithelialization: Treatment Notes Electronic Signature(s) Signed: 11/04/2022 9:53:20 AM By: Yevonne Pax RN Entered By: Yevonne Pax on 11/04/2022 08:16:55 -------------------------------------------------------------------------------- Multi-Disciplinary Care Plan Details Patient Name: Date of Service: Edwin Olson 11/04/2022 8:00 A M Medical Record Number: [id number] Patient Account Number: [id number] Date of Birth/Sex: Treating RN: September 24, 1940 (82 y.o. [name]) Yevonne Pax Primary Care Renalda Locklin: Aram Beecham Other Clinician: Referring Audri Kozub: Treating Miquel Stacks/Extender: RO BSO N, MICHA EL Cathlean Sauer in Treatment: 1 Active Inactive Necrotic Tissue Nursing Diagnoses: Knowledge deficit related to management of necrotic/devitalized tissue Goals: Necrotic/devitalized tissue will be minimized in the wound bed Date Initiated: 10/28/2022 Target Resolution Date: 11/28/2022 Goal Status: Active Interventions: Assess patient pain level pre-, during and post procedure and prior to  discharge Notes: Wound/Skin Impairment Nursing Diagnoses: Knowledge deficit related to ulceration/compromised skin integrity Goals: Patient/caregiver will verbalize understanding of skin care regimen Date Initiated: 10/28/2022 Target Resolution Date: 11/28/2022 Goal Status: Active Ulcer/skin breakdown will have a volume reduction of 30% by week 4 Date Initiated: 10/28/2022 Target Resolution Date: 11/28/2022 Goal Status: Active Ulcer/skin breakdown will have a volume reduction of 50% by week 8 Date Initiated: 10/28/2022 Target Resolution Date: 12/29/2022 Goal Status: Active Ulcer/skin breakdown will have a volume reduction of 80% by week 12 Date Initiated: 10/28/2022 Target Resolution Date: 01/28/2023 TILIAN, CELEDON ([id number]) [id number] Page 5 of 7 Goal Status: Active Ulcer/skin breakdown will heal within 14 weeks Date Initiated: 10/28/2022 Target Resolution Date: 02/28/2023 Goal Status: Active Interventions: Assess patient/caregiver ability to obtain necessary supplies Assess patient/caregiver ability to perform ulcer/skin care regimen upon admission and as needed Assess ulceration(s) every visit Notes: Electronic Signature(s) Signed: 11/04/2022 9:53:20 AM By: Yevonne Pax RN Entered By: Yevonne Pax on 11/04/2022 08:17:15 -------------------------------------------------------------------------------- Pain Assessment Details Patient Name: Date of Service: Edwin Olson 11/04/2022 8:00 A M Medical Record Number: [id number] Patient Account Number: [id number] Date of Birth/Sex: Treating RN: 08/06/1940 (82 y.o. [name]) Yevonne Pax Primary Care Mervyn Pflaum: Aram Beecham Other Clinician: Referring Chivonne Rascon: Treating Hydeia Mcatee/Extender: RO BSO N, MICHA EL Cathlean Sauer in Treatment: 1 Active Problems Location of Pain Severity and Description of Pain Patient Has Paino No Site Locations Pain Management and Medication Current Pain Management: Electronic  Signature(s) Signed: 11/04/2022 9:53:20 AM By: Yevonne Pax RN Entered By: Yevonne Pax on 11/04/2022 08:13:14 -------------------------------------------------------------------------------- Patient/Caregiver Education Details Patient Name: Date of Service: Edwin YLIFF, Grant G. 5/8/2024andnbsp8:00 A M Medical Record Number: [id number] Patient Account Number: [id number] Date of Birth/Gender: Treating RN: [birth year] (82 y.o. [name]) Yevonne Pax Primary Care Physician: Aram Beecham Other Clinician: Referring Physician: Treating Physician/Extender: RO BSO Dorris Carnes, MICHA EL Cathlean Sauer in Treatment: 1 Education Assessment Edwin Olson, Edwin Olson ([id number]) [id number] Page 6 of 7 Education Provided To: Patient Education Topics Provided Wound/Skin Impairment: Handouts: Caring for Your Ulcer Methods: Explain/Verbal Responses: State content correctly Electronic Signature(s) Signed: 11/04/2022 9:53:20 AM By: Yevonne Pax RN Entered By: Yevonne Pax on 11/04/2022 08:17:32 -------------------------------------------------------------------------------- Wound Assessment Details Patient Name: Date of Service: Edwin Olson 11/04/2022 8:00 A M Medical Record Number: [id number]  Patient Account Number: [id number] Date of Birth/Sex: Treating RN: [birth year] (82 y.o. [name]) Yevonne Pax Primary Care Rachana Malesky: Aram Beecham Other Clinician: Referring Shlome Baldree: Treating Shraddha Lebron/Extender: RO BSO N, MICHA EL Cathlean Sauer in Treatment: 1 Wound Status Wound Number: 1 Primary Trauma, Other Etiology: Wound Location: Midline Head - occiput Wound Open Wounding Event: Trauma Status: Date Acquired: 10/08/2022 Comorbid Congestive Heart Failure, Coronary Artery Disease, Weeks Of Treatment: 1 History: Hypertension, Type II Diabetes Clustered Wound: No Photos Wound Measurements Length: (cm) 0.6 Width: (cm) 0.9 Depth: (cm) 0.1 Area: (cm) 0.424 Volume: (cm) 0.042 %  Reduction in Area: 82% % Reduction in Volume: 94.1% Epithelialization: None Tunneling: No Undermining: No Wound Description Classification: Full Thickness Without Exposed Suppor Exudate Amount: Medium Exudate Type: Serosanguineous Exudate Color: red, brown t Structures Foul Odor After Cleansing: No Slough/Fibrino Yes Wound Bed Granulation Amount: Small (1-33%) Exposed Structure Granulation Quality: Pink Fascia Exposed: No Necrotic Amount: Large (67-100%) Fat Layer (Subcutaneous Tissue) Exposed: Yes Necrotic Quality: Eschar, Adherent Slough Tendon Exposed: No Muscle Exposed: No Joint Exposed: No Bone Exposed: No Treatment Notes Arntson, Cleopatra Cedar ([id number]) [id number] Page 7 of 7 Wound #1 (Head - occiput) Wound Laterality: Midline Cleanser [place] Ancillary Kit - 15 Day Supply Discharge Instruction: Use supplies as instructed; Kit contains: (15) Saline Bullets; (15) 3x3 Gauze; 15 pr Gloves Soap and Water Discharge Instruction: Gently cleanse wound with antibacterial soap, rinse and pat dry prior to dressing wounds Wound Cleanser Discharge Instruction: Wash your hands with soap and water. Remove old dressing, discard into plastic bag and place into trash. Cleanse the wound with Wound Cleanser prior to applying a clean dressing using gauze sponges, not tissues or cotton balls. Do not scrub or use excessive force. Pat dry using gauze sponges, not tissue or cotton balls. Peri-Wound Care Topical Primary Dressing Prisma 4.34 (in) Discharge Instruction: Moisten with skintegrity Secondary Dressing Coverlet Latex-Free Fabric Adhesive Dressings Discharge Instruction: 1.5 x 2 Secured With Compression Wrap Compression Stockings Add-Ons Electronic Signature(s) Signed: 11/04/2022 9:53:20 AM By: Yevonne Pax RN Entered By: Yevonne Pax on 11/04/2022 08:16:36 -------------------------------------------------------------------------------- Vitals  Details Patient Name: Date of Service: Edwin Olson. 11/04/2022 8:00 A M Medical Record Number: [id number] Patient Account Number: [id number] Date of Birth/Sex: Treating RN: 1940/08/22 (82 y.o. [name]) Yevonne Pax Primary Care Tema Alire: Aram Beecham Other Clinician: Referring Joevon Holliman: Treating Ezrah Dembeck/Extender: RO BSO N, MICHA EL Cathlean Sauer in Treatment: 1 Vital Signs Time Taken: 08:10 Temperature (F): 98.9 Height (in): 71 Pulse (bpm): 89 Weight (lbs): 168 Respiratory Rate (breaths/min): 18 Body Mass Index (BMI): 23.4 Blood Pressure (mmHg): 128/75 Reference Range: 80 - 120 mg / dl Electronic Signature(s) Signed: 11/04/2022 8:36:09 AM By: Yevonne Pax RN Entered By: Yevonne Pax on 11/04/2022 08:36:08

## 2022-11-05 ENCOUNTER — Ambulatory Visit (INDEPENDENT_AMBULATORY_CARE_PROVIDER_SITE_OTHER): Payer: Medicare Other | Admitting: Neurosurgery

## 2022-11-05 DIAGNOSIS — S065XAA Traumatic subdural hemorrhage with loss of consciousness status unknown, initial encounter: Secondary | ICD-10-CM | POA: Diagnosis not present

## 2022-11-05 DIAGNOSIS — R42 Dizziness and giddiness: Secondary | ICD-10-CM

## 2022-11-05 NOTE — Progress Notes (Signed)
BYRD, BUSS ([id number]) [id number] Page 1 of 5 Visit Report for 11/04/2022 HPI Details Patient Name: Date of Service: Edwin Olson 11/04/2022 8:00 A M Medical Record Number: [id number] Patient Account Number: [id number] Date of Birth/Sex: Treating RN: [birth year] (82 y.o. Edwin Olson) Edwin Olson Primary Care Provider: Aram Olson Other Clinician: Referring Provider: Treating Provider/Extender: Edwin Olson, Edwin Olson Edwin Olson in Treatment: 1 History of Present Illness HPI Description: 10/28/2022 Edwin Olson is an 82 year old male with a past medical history of CAD, controlled type 2 diabetes that presents the clinic for a 2-week history of open wound to the scalp. On 10/08/2022 he visited the ED after experiencing a fall. He had a laceration to the scalp and had 13 staples placed. He had these removed on 4/19 and the wound dehisced. He has been keeping the area covered. Of note when he had a CT scan it was noted to have an acute subdural hematoma over the right cerebral convexity, falx and tentorium. On repeat 6 hours later this was noted to be stable. He is scheduled to have an MRI later this month. He currently denies any recent loss of consciousness, dizziness or falls. 5/8This is a patient who had a fall and a laceration to his scalp that was sutured. After removal of the sutures the wound dehisced and we have been dealing with this. He has had remarkable improvement in the wound is a fraction of the size that it was when he first came here which is fairly remarkable. We have been using Dakin's wet-to-dry. There was some suggestion that he needed blast X however I think the wound is improved so much that that is going to be unnecessary. The patient's wife changes the dressing daily Electronic Signature(s) Signed: 11/04/2022 4:41:35 PM By: Edwin Najjar MD Entered By: Edwin Olson on 11/04/2022  10:00:37 -------------------------------------------------------------------------------- Physical Exam Details Patient Name: Date of Service: Edwin Olson 11/04/2022 8:00 A M Medical Record Number: [id number] Patient Account Number: [id number] Date of Birth/Sex: Treating RN: 1940-10-12 (82 y.o. Edwin Olson Primary Care Provider: Aram Olson Other Clinician: Referring Provider: Treating Provider/Extender: Edwin Olson, Edwin Olson Edwin Olson in Treatment: 1 Constitutional Sitting or standing Blood Pressure is within target range for patient.. Pulse regular and within target range for patient.Marland Kitchen Respirations regular, non-labored and within target range.. Temperature is normal and within the target range for the patient.Marland Kitchen appears in no distress. Notes Wound exam; posterior aspect of the scalp over the occiput. Small healthy looking wound. Under illumination nice looking granulation. No evidence of surrounding infection. There is no hypergranulation. Electronic Signature(s) Signed: 11/04/2022 4:41:35 PM By: Edwin Najjar MD Entered By: Edwin Olson on 11/04/2022 08:43:46 -------------------------------------------------------------------------------- Physician Orders Details Patient Name: Date of Service: Edwin Olson 11/04/2022 8:00 A M Medical Record Number: [id number] Patient Account Number: [id number] Date of Birth/Sex: Treating RN: 1940/08/10 (82 y.o. Edwin Olson) Edwin Olson Primary Care Provider: Aram Olson Other Clinician: Referring Provider: Treating Provider/Extender: Edwin BSO Edwin Olson, Edwin Olson Edwin Olson in Treatment: 1 Verbal / Phone Orders: No Diagnosis Coding [name], Edwin Olson ([id number]) [id number] Page 2 of 5 Follow-up Appointments Return Appointment in 1 week. Bathing/ Shower/ Hygiene Other: - do not get wet Anesthetic (Use 'Patient Medications' Section for Anesthetic Order Entry) Lidocaine applied to wound  bed Wound Treatment Wound #1 - Head - occiput Wound Laterality: Midline Cleanser: Byram Ancillary Kit - 15 Day Supply (Generic) 1 x Per Day/30 Days Discharge Instructions: Use  supplies as instructed; Kit contains: (15) Saline Bullets; (15) 3x3 Gauze; 15 pr Gloves Cleanser: Soap and Water 1 x Per Day/30 Days Discharge Instructions: Gently cleanse wound with antibacterial soap, rinse and pat dry prior to dressing wounds Cleanser: Wound Cleanser 1 x Per Day/30 Days Discharge Instructions: Wash your hands with soap and water. Remove old dressing, discard into plastic bag and place into trash. Cleanse the wound with Wound Cleanser prior to applying a clean dressing using gauze sponges, not tissues or cotton balls. Do not scrub or use excessive force. Pat dry using gauze sponges, not tissue or cotton balls. Prim Dressing: Prisma 4.34 (in) 1 x Per Day/30 Days ary Discharge Instructions: Moisten with skintegrity Secondary Dressing: Coverlet Latex-Free Fabric Adhesive Dressings 1 x Per Day/30 Days Discharge Instructions: 1.5 x 2 Electronic Signature(s) Signed: 11/04/2022 9:53:20 AM By: Edwin Pax RN Signed: 11/04/2022 4:41:35 PM By: Edwin Najjar MD Entered By: Edwin Olson on 11/04/2022 08:29:54 -------------------------------------------------------------------------------- Problem List Details Patient Name: Date of Service: Edwin Olson 11/04/2022 8:00 A M Medical Record Number: [id number] Patient Account Number: [id number] Date of Birth/Sex: Treating RN: October 10, 1940 (82 y.o. Edwin Olson) Edwin Olson Primary Care Provider: Aram Olson Other Clinician: Referring Provider: Treating Provider/Extender: Edwin Olson, Edwin Olson Edwin Olson in Treatment: 1 Active Problems ICD-10 Encounter Code Description Active Date MDM Diagnosis S01.80XA Unspecified open wound of other part of head, initial encounter 10/28/2022 No Yes L98.492 Non-pressure chronic ulcer of skin of other sites with fat layer  exposed 10/28/2022 No Yes E11.622 Type 2 diabetes mellitus with other skin ulcer 10/28/2022 No Yes T79.8XXA Other early complications of trauma, initial encounter 10/28/2022 No Yes Z79.4 Long term (current) use of insulin 10/28/2022 No Yes Inactive Problems [name], SANTIEL HOCKEY ([id number]) [phone].pdf Page 3 of 5 Resolved Problems Electronic Signature(s) Signed: 11/04/2022 4:41:35 PM By: Edwin Najjar MD Entered By: Edwin Olson on 11/04/2022 08:41:51 -------------------------------------------------------------------------------- Progress Note Details Patient Name: Date of Service: Edwin Olson 11/04/2022 8:00 A M Medical Record Number: [id number] Patient Account Number: [id number] Date of Birth/Sex: Treating RN: [birth year] (82 y.o. Edwin Olson) Edwin Olson Primary Care Provider: Aram Olson Other Clinician: Referring Provider: Treating Provider/Extender: Edwin Olson, Edwin Olson Edwin Olson in Treatment: 1 Subjective History of Present Illness (HPI) 10/28/2022 Edwin Olson is an 82 year old male with a past medical history of CAD, controlled type 2 diabetes that presents the clinic for a 2-week history of open wound to the scalp. On 10/08/2022 he visited the ED after experiencing a fall. He had a laceration to the scalp and had 13 staples placed. He had these removed on 4/19 and the wound dehisced. He has been keeping the area covered. Of note when he had a CT scan it was noted to have an acute subdural hematoma over the right cerebral convexity, falx and tentorium. On repeat 6 hours later this was noted to be stable. He is scheduled to have an MRI later this month. He currently denies any recent loss of consciousness, dizziness or falls. This is a patient who had a fall and a laceration to his scalp that was sutured. After removal of the sutures the wound dehisced and we have been dealing with this. He has had remarkable improvement in the wound is a fraction  of the size that it was when he first came here which is fairly remarkable. We have been using Dakin's wet-to-dry. There was some suggestion that he needed blast X however I think the wound is improved  so much that that is going to be unnecessary. The patient's wife changes the dressing daily Objective Constitutional Sitting or standing Blood Pressure is within target range for patient.. Pulse regular and within target range for patient.Marland Kitchen Respirations regular, non-labored and within target range.. Temperature is normal and within the target range for the patient.Marland Kitchen appears in no distress. Vitals Time Taken: 8:10 AM, Height: 71 in, Weight: 168 lbs, BMI: 23.4, Temperature: 98.9 F, Pulse: 89 bpm, Respiratory Rate: 18 breaths/min, Blood Pressure: 128/75 mmHg. General Notes: Wound exam; posterior aspect of the scalp over the occiput. Small healthy looking wound. Under illumination nice looking granulation. No evidence of surrounding infection. There is no hypergranulation. Integumentary (Hair, Skin) Wound #1 status is Open. Original cause of wound was Trauma. The date acquired was: 10/08/2022. The wound has been in treatment 1 weeks. The wound is located on the Midline Head - occiput. The wound measures 0.6cm length x 0.9cm width x 0.1cm depth; 0.424cm^2 area and 0.042cm^3 volume. There is Fat Layer (Subcutaneous Tissue) exposed. There is no tunneling or undermining noted. There is a medium amount of serosanguineous drainage noted. There is small (1-33%) pink granulation within the wound bed. There is a large (67-100%) amount of necrotic tissue within the wound bed including Eschar and Adherent Slough. Assessment Active Problems ICD-10 Unspecified open wound of other part of head, initial encounter Non-pressure chronic ulcer of skin of other sites with fat layer exposed Type 2 diabetes mellitus with other skin ulcer Other early complications of trauma, initial encounter Long term (current) use of  insulin Edwin Olson, Edwin Olson ([id number]) [id number] Page 4 of 5 Plan Follow-up Appointments: Return Appointment in 1 week. Bathing/ Shower/ Hygiene: Other: - do not get wet Anesthetic (Use 'Patient Medications' Section for Anesthetic Order Entry): Lidocaine applied to wound bed WOUND #1: - Head - occiput Wound Laterality: Midline Cleanser: Byram Ancillary Kit - 15 Day Supply (Generic) 1 x Per Day/30 Days Discharge Instructions: Use supplies as instructed; Kit contains: (15) Saline Bullets; (15) 3x3 Gauze; 15 pr Gloves Cleanser: Soap and Water 1 x Per Day/30 Days Discharge Instructions: Gently cleanse wound with antibacterial soap, rinse and pat dry prior to dressing wounds Cleanser: Wound Cleanser 1 x Per Day/30 Days Discharge Instructions: Wash your hands with soap and water. Remove old dressing, discard into plastic bag and place into trash. Cleanse the wound with Wound Cleanser prior to applying a clean dressing using gauze sponges, not tissues or cotton balls. Do not scrub or use excessive force. Pat dry using gauze sponges, not tissue or cotton balls. Prim Dressing: Prisma 4.34 (in) 1 x Per Day/30 Days ary Discharge Instructions: Moisten with skintegrity Secondary Dressing: Coverlet Latex-Free Fabric Adhesive Dressings 1 x Per Day/30 Days Discharge Instructions: 1.5 x 2 1. The patient's wound is remarkably better. 2. Surface looks completely acceptable no mechanical debridement was necessary and I do not think blast X is necessary. Nor do I really think Dakin's wet-to- dry is necessary, we will change to collagen moistened with hydrogel change daily 3. Will give him an appointment for 2 weeks this may be healed by then. Asked him to call us if there is any difficulties Electronic Signature(s) Signed: 11/04/2022 4:41:35 PM By: Edwin Najjar MD Entered By: Edwin Olson on 11/04/2022  08:44:55 -------------------------------------------------------------------------------- SuperBill Details Patient Name: Date of Service: Edwin Olson 11/04/2022 Medical Record Number: [id number] Patient Account Number: [id number] Date of Birth/Sex: Treating RN: 1940/12/13 (82 y.o. Edwin Olson Primary Care Provider: Aram Olson Other Clinician: Referring  Provider: Treating Provider/Extender: Edwin Olson, Edwin Olson Tawanna Solo Weeks in Treatment: 1 Diagnosis Coding ICD-10 Codes Code Description S01.80XA Unspecified open wound of other part of head, initial encounter L98.492 Non-pressure chronic ulcer of skin of other sites with fat layer exposed E11.622 Type 2 diabetes mellitus with other skin ulcer T79.8XXA Other early complications of trauma, initial encounter Z79.4 Long term (current) use of insulin Facility Procedures : CPT4 Code: [id number] Description: [phone] - WOUND CARE VISIT-LEV 2 EST PT Modifier: Quantity: 1 Physician Procedures : CPT4 Code Description Modifier [id number] - WC PHYS LEVEL 3 - EST PT ICD-10 Diagnosis Description S01.80XA Unspecified open wound of other part of head, initial encounter L98.492 Non-pressure chronic ulcer of skin of other sites with fat layer  exposed Quantity: 1 Electronic Signature(s) Signed: 11/04/2022 4:41:35 PM By: Edwin Najjar MD Edwin Olson, Edwin Olson 11/04/2022 4:41:35 PM By: Edwin Najjar MD SignedReece Agar ([id number]) [id number] Page 5 of 5 Entered By: Edwin Olson on 11/04/2022 08:45:12

## 2022-11-05 NOTE — Progress Notes (Signed)
Referring Physician:  Lucy Chris, MD [address] [place],  [place] [address]  Primary Physician:  Marguarite Arbour, MD  History of Present Illness: 11/05/2022 [name] is here today to follow-up a minor head injury from approximate 1 month ago.  He had a repeat CT scan 3 days ago.  He has no headaches, nausea, or vomiting.  He does report some dizziness when he turns his head to the right.  This is new for him.  Review of Systems:  A 10 point review of systems is negative, except for the pertinent positives and negatives detailed in the HPI.  Past Medical History: Past Medical History:  Diagnosis Date   Arthritis    back   Congenital single kidney    only left kidney   Coronary artery disease    Diabetes mellitus without complication (HCC)    Diverticulitis    Diverticulitis    Dysrhythmia    PVC's   GERD (gastroesophageal reflux disease)    Hypertension    Mitral valve insufficiency    Stiff neck    Limited up and down motion due to arthritis    Past Surgical History: Past Surgical History:  Procedure Laterality Date   BACK SURGERY     CARDIAC CATHETERIZATION  2015   cardiac stents      cardiac stents    CATARACT EXTRACTION W/PHACO Right 04/29/2022   Procedure: CATARACT EXTRACTION PHACO AND INTRAOCULAR LENS PLACEMENT (IOC) RIGHT DIABETIC 10.86 01:31.1;  Surgeon: Lockie Mola, MD;  Location: [facility];  Service: Ophthalmology;  Laterality: Right;  diabetic   CATARACT EXTRACTION W/PHACO Left 05/13/2022   Procedure: CATARACT EXTRACTION PHACO AND INTRAOCULAR LENS PLACEMENT (IOC) LEFT DIABETIC;  Surgeon: Lockie Mola, MD;  Location: [facility];  Service: Ophthalmology;  Laterality: Left;  11.30 1:21.8   CHOLECYSTECTOMY     COLONOSCOPY WITH PROPOFOL N/A 05/10/2015   Procedure: COLONOSCOPY WITH PROPOFOL;  Surgeon: Wallace Cullens, MD;  Location: [facility];  Service: Gastroenterology;  Laterality: N/A;   COLONOSCOPY WITH  PROPOFOL N/A 07/30/2020   Procedure: COLONOSCOPY WITH PROPOFOL;  Surgeon: Regis Bill, MD;  Location: [facility];  Service: Endoscopy;  Laterality: N/A;   COLONOSCOPY WITH PROPOFOL N/A 03/30/2022   Procedure: COLONOSCOPY WITH PROPOFOL;  Surgeon: Regis Bill, MD;  Location: [facility] END[facility];  Service: Endoscopy;  Laterality: N/A;    Allergies: Allergies as of 11/05/2022 - Review Complete 10/09/2022  Allergen Reaction Noted   Clonidine Swelling 04/23/2016   Dapagliflozin  05/07/2020   Glipizide Rash and Swelling 08/14/2013   Atorvastatin  02/15/2014   Crestor [rosuvastatin] Itching 02/26/2016   Doxycycline Other (See Comments) 08/14/2013   Fenofibrate  07/05/2019   Isosorbide  01/28/2016   Meclizine  08/14/2013   Percocet [oxycodone-acetaminophen] Nausea And Vomiting 12/26/2014   Ultram [tramadol] Itching 12/26/2014   Amlodipine Rash 08/22/2015   Atenolol Rash 12/26/2014   Clopidogrel Rash 12/21/2014   Lisinopril Rash 02/16/2020   Losartan Rash 04/23/2016   Metaxalone Rash 02/15/2014   Penicillins Rash 12/21/2014   Plavix [clopidogrel bisulfate] Itching and Rash 12/21/2014    Medications:  Current Outpatient Medications:    allopurinol (ZYLOPRIM) 100 MG tablet, Take 100 mg by mouth daily., Disp: , Rfl:    azelastine (ASTELIN) 0.1 % nasal spray, Place into both nostrils 2 (two) times daily as needed for rhinitis. Use in each nostril as directed, Disp: , Rfl:    colchicine 0.6 MG tablet, Take 0.6 mg by mouth daily as  needed.  First signs of gout, take 2 tablets immediately, an hour later take additional 1 tablet, the next day continue 1 tablet daily for 5 days, Disp: , Rfl:    diazepam (VALIUM) 5 MG tablet, Take 5 mg by mouth at bedtime as needed for anxiety., Disp: , Rfl:    DULoxetine (CYMBALTA) 30 MG capsule, Take 30 mg by mouth at bedtime., Disp: , Rfl:    EPINEPHrine 0.3 mg/0.3 mL IJ SOAJ injection, Inject 0.3 mg into the muscle as needed for anaphylaxis.,  Disp: , Rfl:    ezetimibe (ZETIA) 10 MG tablet, Take 10 mg by mouth daily., Disp: , Rfl:    gabapentin (NEURONTIN) 600 MG tablet, Take 600 mg by mouth in the morning., Disp: , Rfl:    HYDROcodone-acetaminophen (NORCO/VICODIN) 5-325 MG tablet, Take 1 tablet by mouth every 6 (six) hours as needed for moderate pain., Disp: , Rfl:    insulin glargine (LANTUS) 100 UNIT/ML injection, Inject 0.08 mLs (8 Units total) into the skin daily., Disp: 10 mL, Rfl:    isosorbide mononitrate (IMDUR) 30 MG 24 hr tablet, Take 30 mg by mouth 2 (two) times daily., Disp: , Rfl:    lovastatin (MEVACOR) 40 MG tablet, Take 40 mg by mouth at bedtime., Disp: , Rfl:    metFORMIN (GLUCOPHAGE) 500 MG tablet, Take 500 mg by mouth 2 (two) times daily with a meal., Disp: , Rfl:    metoprolol succinate (TOPROL-XL) 100 MG 24 hr tablet, Take 1 tablet by mouth 2 times daily at 12 noon and 4 pm., Disp: , Rfl:    nitroGLYCERIN (NITROSTAT) 0.4 MG SL tablet, Place 0.4 mg under the tongue every 5 (five) minutes as needed for chest pain., Disp: , Rfl:    Omeprazole-Sodium Bicarbonate (ZEGERID) 20-1100 MG CAPS capsule, Take 1 capsule by mouth daily before breakfast., Disp: , Rfl:    QUEtiapine (SEROQUEL) 25 MG tablet, Take 25 mg by mouth at bedtime., Disp: , Rfl:    Semaglutide (OZEMPIC, 0.25 OR 0.5 MG/DOSE, [place]), Inject 0.45 mg into the skin once a week. Friday, Disp: , Rfl:    telmisartan (MICARDIS) 80 MG tablet, Take 1 tablet by mouth every morning., Disp: , Rfl:   Social History: Social History   Tobacco Use   Smoking status: Never   Smokeless tobacco: Never  Vaping Use   Vaping Use: Never used  Substance Use Topics   Alcohol use: No   Drug use: No    Family Medical History: No family history on file.  Physical Examination:     Medical Decision Making  Imaging: CT Head 11/02/2022 IMPRESSION: 1. The previous subdural hematoma along the posterior aspect of the falx and right tentorium has improved from prior exam and  near completely resolved. There may be faint residual, measuring 1-2 mm in maximal depth. No new or progressive hemorrhage. 2. Diminishing posterior scalp hematoma, mild residual. 3. Stable atrophy and chronic small vessel ischemia.     Electronically Signed   By: Narda Rutherford M.D.   On: 11/02/2022 19:48    I have personally reviewed the images and agree with the above interpretation.  Assessment and Plan: [name] is a pleasant 82 y.o. male with history of subdural hematoma that has dissipated.  He is doing well from that standpoint.  He is having some dizziness which could be the result of BPPV versus postconcussive symptoms.  We discussed this and he would like to see ENT for evaluation.  Will see him back on an  as-needed basis.  This visit was performed via telephone.  Patient location: home Provider location: office  I spent a total of 5 minutes non-face-to-face activities for this visit on the date of this encounter including review of current clinical condition and response to treatment.  The patient is aware of and accepts the limits of this telehealth visit.    Thank you for involving me in the care of this patient.      Megha Agnes K. Myer Haff MD, [facility]

## 2022-11-18 ENCOUNTER — Encounter (HOSPITAL_BASED_OUTPATIENT_CLINIC_OR_DEPARTMENT_OTHER): Payer: Medicare Other | Admitting: Internal Medicine

## 2022-11-18 DIAGNOSIS — L98492 Non-pressure chronic ulcer of skin of other sites with fat layer exposed: Secondary | ICD-10-CM | POA: Diagnosis not present

## 2022-11-18 DIAGNOSIS — S0180XA Unspecified open wound of other part of head, initial encounter: Secondary | ICD-10-CM

## 2022-11-18 DIAGNOSIS — T798XXA Other early complications of trauma, initial encounter: Secondary | ICD-10-CM

## 2022-11-18 DIAGNOSIS — E11622 Type 2 diabetes mellitus with other skin ulcer: Secondary | ICD-10-CM | POA: Diagnosis not present

## 2022-12-12 IMAGING — CT CT CERVICAL SPINE W/O CM
3 series · 11 of 33 positions shown, 13 images · IV contrast (APPLIED)
Comparison: Cervical spine MRI 11/05/2021. CT angiogram head/neck
10/27/2013.

CLINICAL DATA: Soft tissue swelling, infection suspected, neck
x-ray done. Left-sided neck pain, reported
submandibular/postauricular swelling with sx of TGN.

EXAM:
CT NECK WITH CONTRAST
CT CERVICAL SPINE WITHOUT CONTRAST
TECHNIQUE: Multidetector CT imaging of the neck was performed using the
standard protocol following the bolus administration of intravenous
contrast.

[Series 1: ax cervical spine · axial · 0.35mm/px · z∈[-139,-27]mm · 3 of 93 slices shown, 4 images]
[im 22/93  soft-tissue]
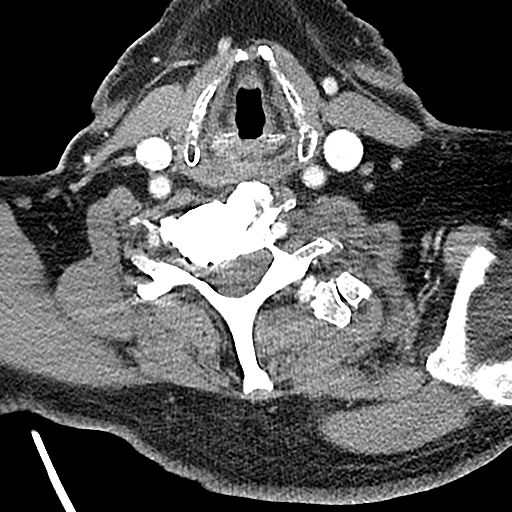
[im 22/93  bone]
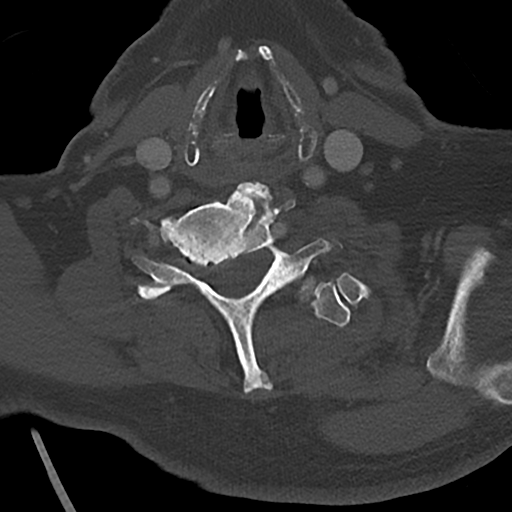
[im 50/93  bone]
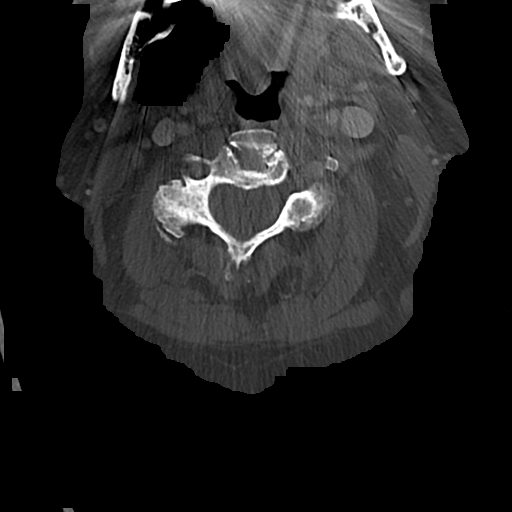
[im 78/93  bone]
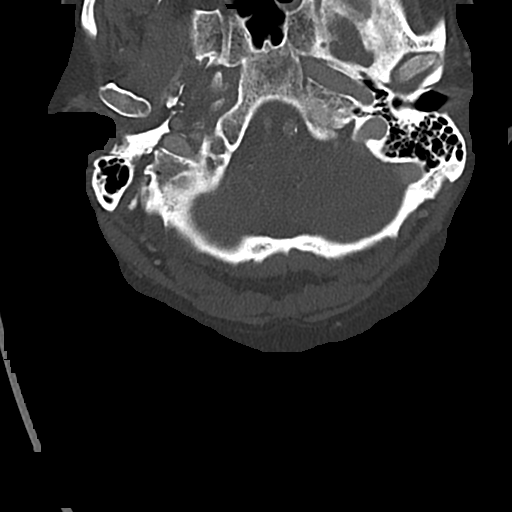

[Series 4: cor cervical spine · coronal · 0.34mm/px · 3 of 82 slices shown]
[im 17/82  bone]
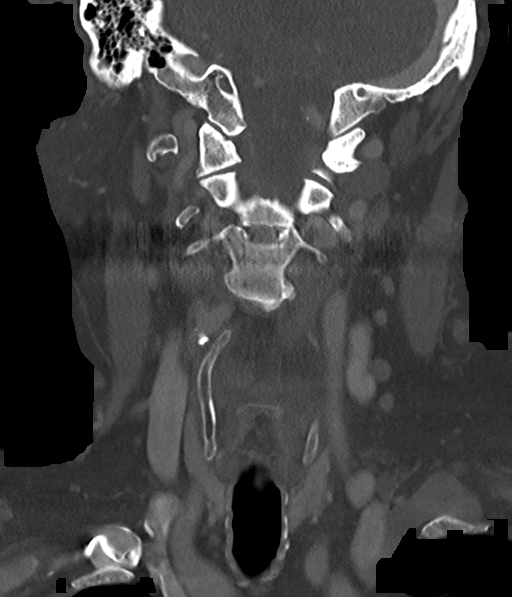
[im 33/82  bone]
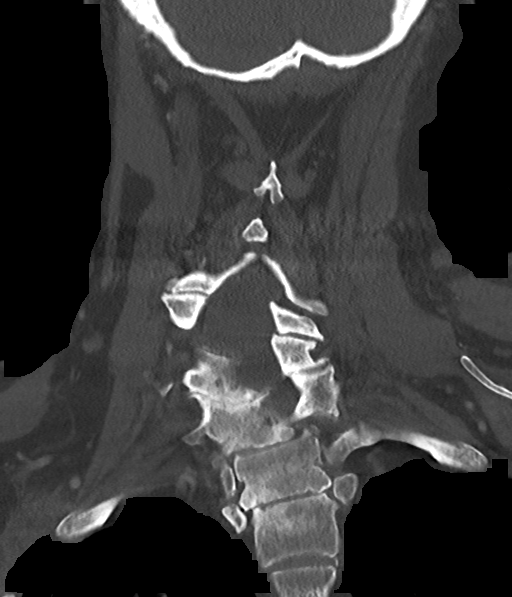
[im 49/82  bone]
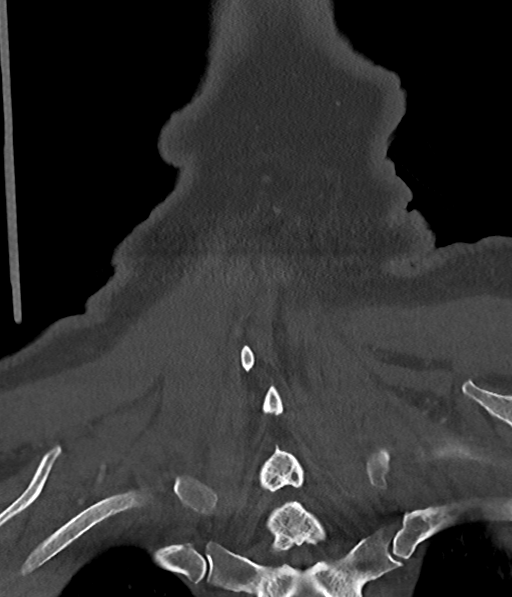

[Series 5: sag cervical spine · sagittal · 0.32mm/px · 5 of 88 slices shown, 6 images]
[im 30/88  bone]
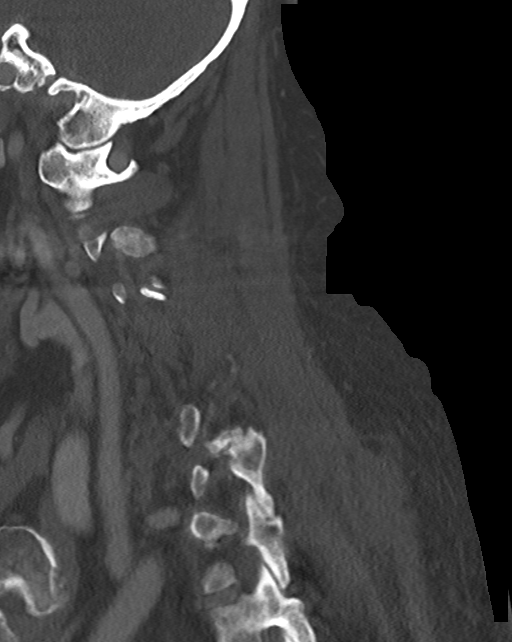
[im 37/88  bone]
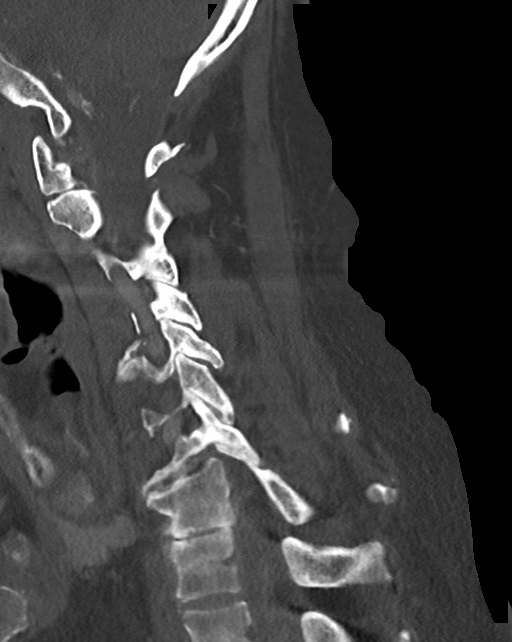
[im 44/88  soft-tissue]
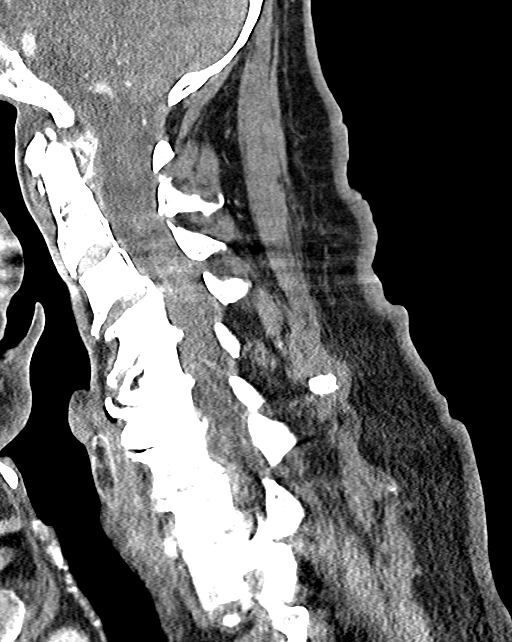
[im 44/88  bone]
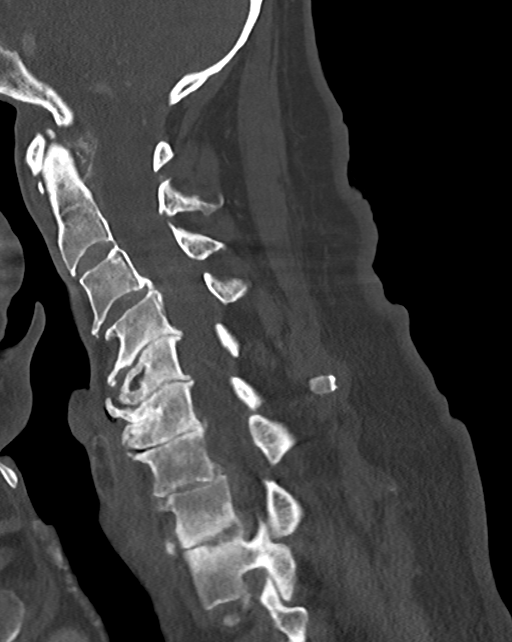
[im 51/88  bone]
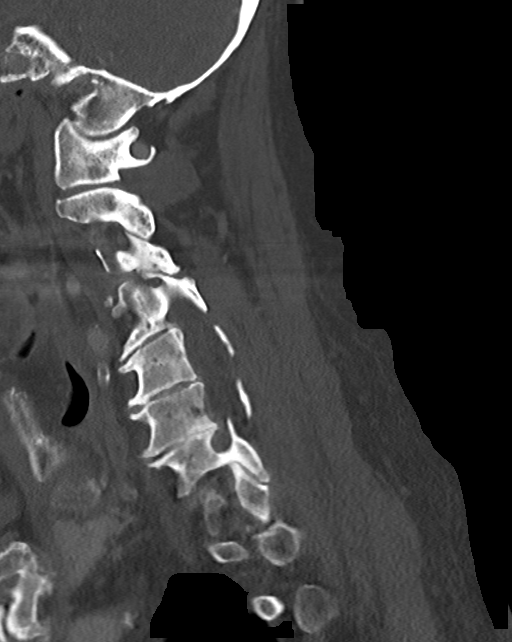
[im 59/88  bone]
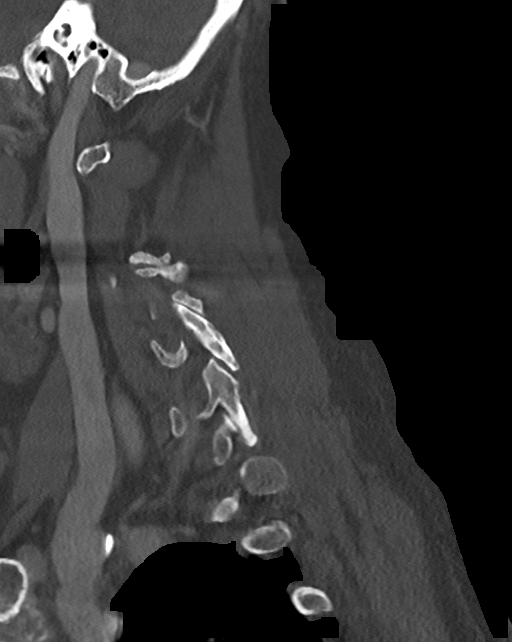

[11 of 33 positions shown; findings below may reference images not displayed]

Multidetector CT imaging of the cervical spine was performed without
intravenous contrast. Multiplanar CT image reconstructions were also
generated.

RADIATION DOSE REDUCTION: This exam was performed according to the
departmental dose-optimization program which includes automated
exposure control, adjustment of the mA and/or kV according to
patient size and/or use of iterative reconstruction technique.

CONTRAST:  100mL OMNIPAQUE IOHEXOL 300 MG/ML  SOLN
FINDINGS: CT NECK FINDINGS:

Pharynx and larynx: Streak and beam hardening artifact arising from
dental restoration obscures significant portions of the oral cavity
and oropharynx. Within this limitation, there is no appreciable
swelling or discrete mass within the oral cavity, pharynx or larynx.

Salivary glands: No inflammation, mass, or stone.

Thyroid: Unremarkable.

Lymph nodes: No pathologically enlarged lymph nodes identified
within the neck.

Vascular: The major vascular structures of the neck are patent.
Atherosclerotic plaque within the visualized aortic arch and
proximal major branch vessels of the neck, as well as carotid and
vertebral arteries.

Limited intracranial: No appreciable acute intracranial abnormality
within the field of view.

Visualized orbits: No orbital mass or acute orbital finding.

Mastoids and visualized paranasal sinuses: Small-volume secretions
within the left sphenoid sinus. No significant mastoid effusion.

Skeleton: Cervical spine findings reported below. Elsewhere, no
acute bony abnormality or aggressive osseous lesion is identified.

Upper chest: No consolidation within the imaged lung apices. Minimal
atelectasis within the dependent aspect of the imaged right upper
lobe.

CT CERVICAL SPINE FINDINGS:

Alignment: Prominent cervical dextrocurvature. Reversal of the
expected cervical lordosis. 3 mm C3-C4 grade 1 anterolisthesis. 2 mm
C7-T1 grade 1 anterolisthesis.

Skull base and vertebrae: The basion-dental and atlanto-dental
intervals are maintained.No evidence of acute fracture to the
cervical spine. At C2-C3, there is left-sided facet joint ankylosis
and suspected early osseous fusion across the left aspect of the
C2-C3 disc space. Multilevel ventral osteophytes, most prominent at
C4-C5, C5-C6 and C6-C7.

Soft tissues and spinal canal: No prevertebral fluid or swelling. No
visible canal hematoma.

Disc levels:

Multilevel disc space narrowing. Most notably, disc space narrowing
is advanced at C4-C5, C5-C6, C6-C7, C7-T1 and T1-T2. Endplate
irregularity and endplate sclerosis at T1-T2, which may be
degenerative.

Degenerative changes about the C1-C2 articulation.

C2-C3: Facet joint ankylosis on the left. Suspected early osseous
fusion across the left aspect of the disc space. Mild facet
arthrosis on the right. No appreciable significant disc herniation
or spinal canal stenosis. No significant bony neural foraminal
narrowing.

C3-C4: Grade 1 anterolisthesis. Posterior disc osteophyte complex
with bilateral disc osteophyte ridge/uncinate hypertrophy. Mild
facet arthrosis. No more than mild spinal canal narrowing is
appreciated. Severe bony neural foraminal narrowing on the right.
Mild bony neural foraminal narrowing on the left.

C4-C5: Posterior disc osteophyte complex eccentric to the left with
left-sided disc osteophyte ridge/uncinate hypertrophy. Minimal
uncovertebral hypertrophy also present on the right. Minimal facet
arthrosis. Moderate spinal canal stenosis. Severe left bony neural
foraminal narrowing.

C5-C6: Posterior disc osteophyte complex eccentric to the left with
left-sided disc osteophyte ridge/uncinate hypertrophy. Minimal
uncovertebral hypertrophy also present on the right. No more than
mild relative spinal canal narrowing or bony neural foraminal
narrowing is appreciated.

C6-C7: Posterior disc osteophyte complex with right greater than
left disc osteophyte ridge/uncinate hypertrophy. Moderate spinal
canal stenosis. Moderate bony neural foraminal narrowing on the
right.

C7-T1: Posterior disc osteophyte complex with right-sided disc
osteophyte ridge. Facet arthrosis on the left. Apparent mild spinal
canal stenosis. Bilateral bony neural foraminal narrowing
(moderate/severe right, moderate left).

T1-T2: Disc bulge with endplate osteophytes. Disc osteophyte ridge
to the right. No more than mild spinal canal narrowing is
appreciated. Moderate/severe right bony neural foraminal narrowing.
IMPRESSION: CT neck impression:

1. Streak and beam hardening artifact arising from dental
restoration obscures significant portions of the oral cavity and
oropharynx. Within this limitation, there is no appreciable mass,
swelling or inflammation within the neck or face.
2. Mild left sphenoid sinusitis.
3. Aortic Atherosclerosis ([id number]). Atherosclerotic plaque also
present within the proximal major branch vessels of the neck, as
well as carotid and vertebral arteries.

CT cervical spine impression:

1. No evidence of acute fracture to the cervical spine.
2. Cervical spondylosis, as outlined. Spinal canal stenosis is
greatest at C4-C5 and C6-C7 (moderate at these levels). Multilevel
foraminal stenosis, as detailed and greatest on the right at C3-C4
(severe), on the left at C4-C5 (severe), on the right at C7-T1
(moderate/severe) and on the right at T1-T2 (moderate/severe). Disc
degeneration is advanced at C4-C5, C5-C6, C6-C7 and C7-T1. Endplate
irregularity and sclerosis at T1-T[DATE] be degenerative in etiology.
Please note, a cervical spine MRI without and with contrast would
have greater sensitivity for early discitis/osteomyelitis.
3. Prominent cervical dextrocurvature.
4. Mild grade 1 anterolisthesis at C3-C4 and C7-T1.

## 2022-12-12 IMAGING — CT CT NECK W/ CM
4 of 6 series · 11 of 35 positions shown, 13 images · IV contrast (APPLIED)
Comparison: Cervical spine MRI 11/05/2021. CT angiogram head/neck
10/27/2013.

CLINICAL DATA: Soft tissue swelling, infection suspected, neck
x-ray done. Left-sided neck pain, reported
submandibular/postauricular swelling with sx of TGN.

EXAM:
CT NECK WITH CONTRAST
CT CERVICAL SPINE WITHOUT CONTRAST
TECHNIQUE: Multidetector CT imaging of the neck was performed using the
standard protocol following the bolus administration of intravenous
contrast.

[Series 3: axial neck · axial · 0.54mm/px · z∈[-165,-81]mm · 2 of 126 slices shown, 3 images]
[im 42/126  soft-tissue]
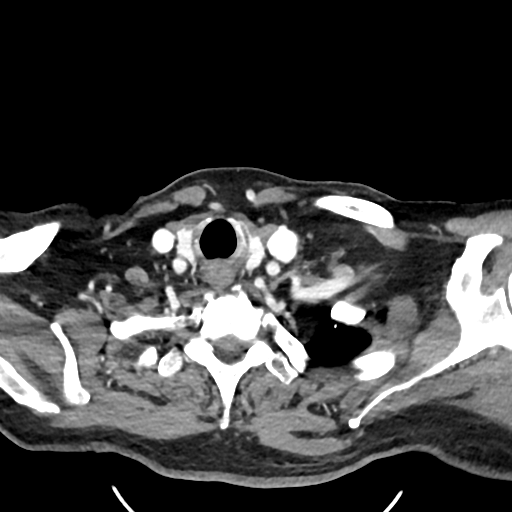
[im 42/126  bone]
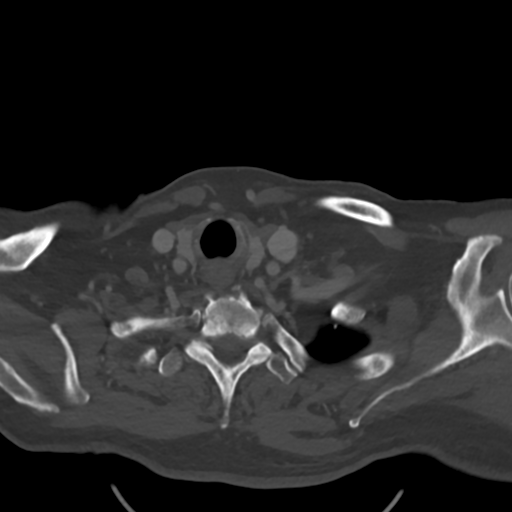
[im 84/126  bone]
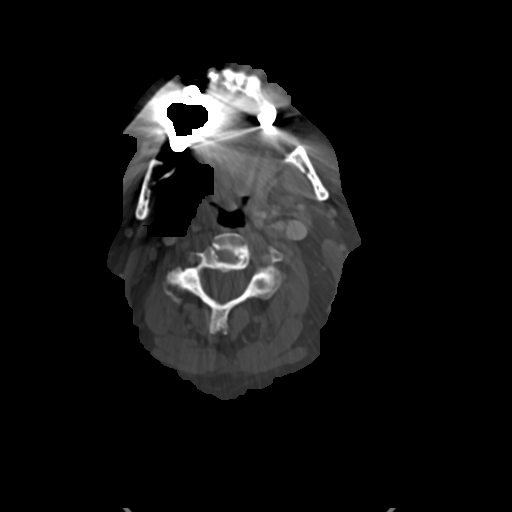

[Series 6: sag neck · sagittal · 0.51mm/px · 5 of 137 slices shown, 6 images]
[im 46/137  bone]
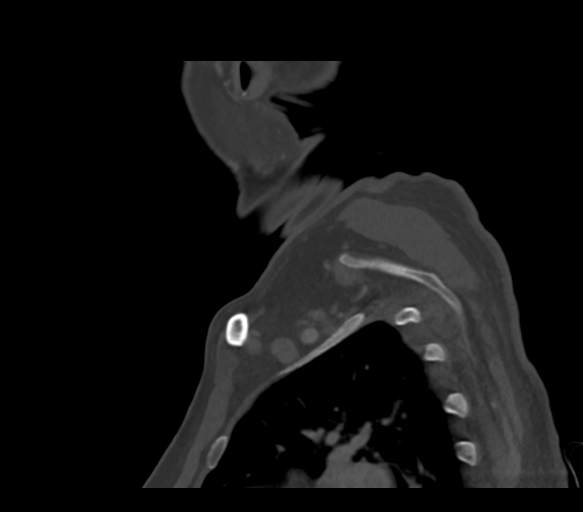
[im 57/137  bone]
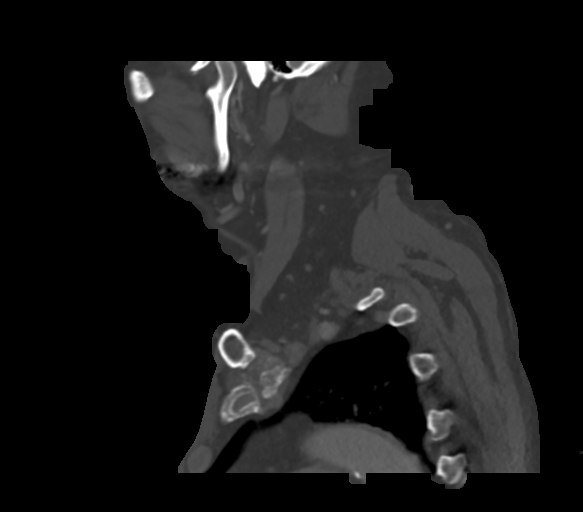
[im 69/137  soft-tissue]
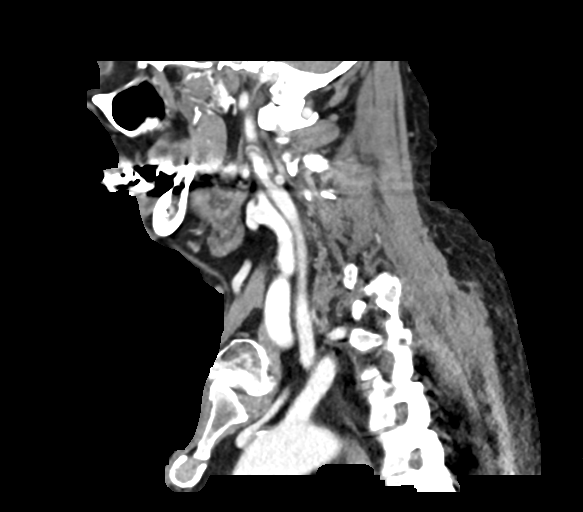
[im 69/137  bone]
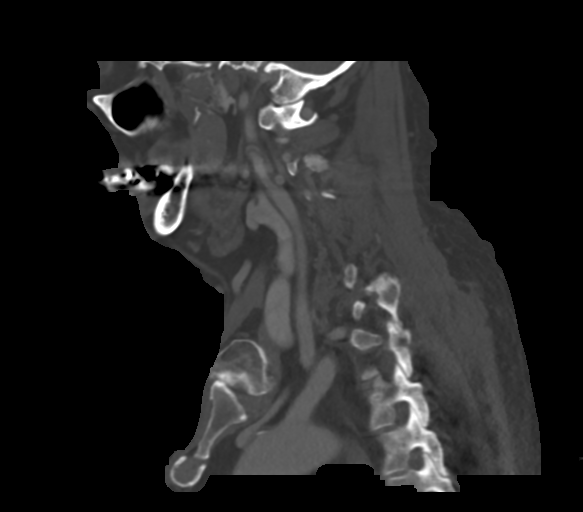
[im 80/137  bone]
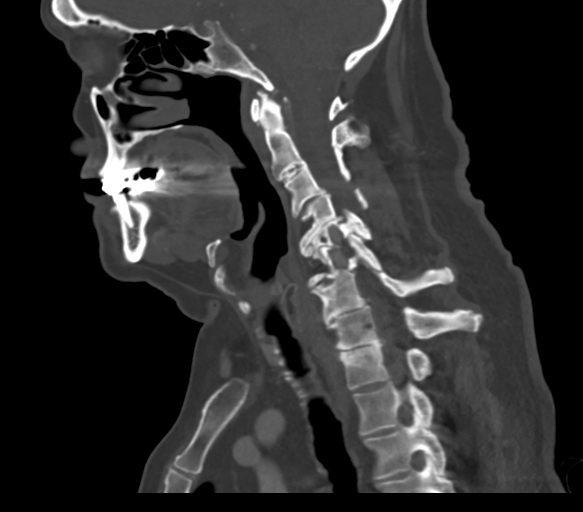
[im 91/137  bone]
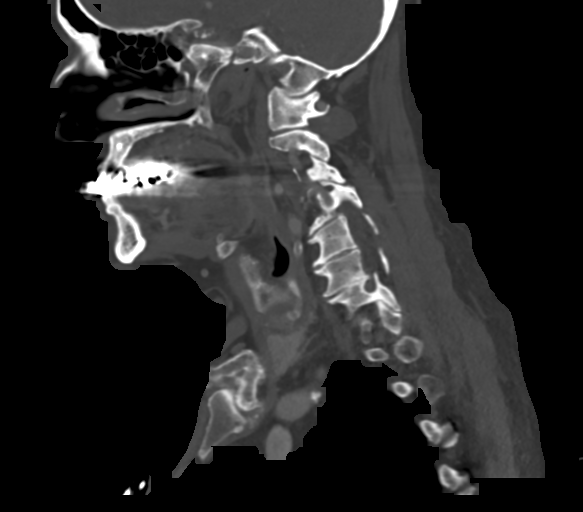

[Series 7: cor neck · coronal · 0.51mm/px · 3 of 126 slices shown]
[im 26/126  bone]
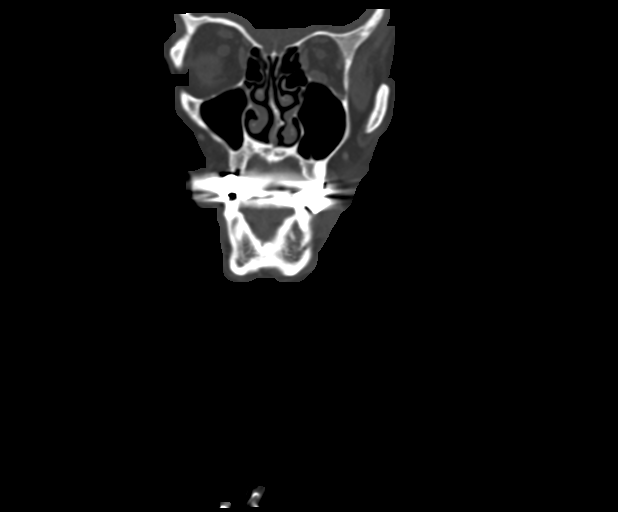
[im 51/126  bone]
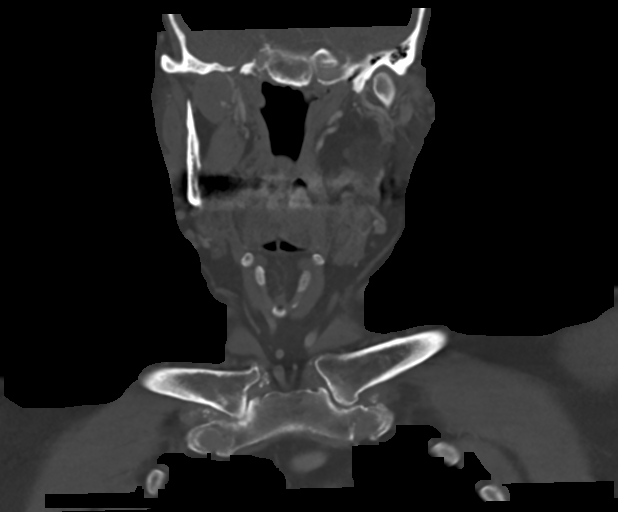
[im 76/126  bone]
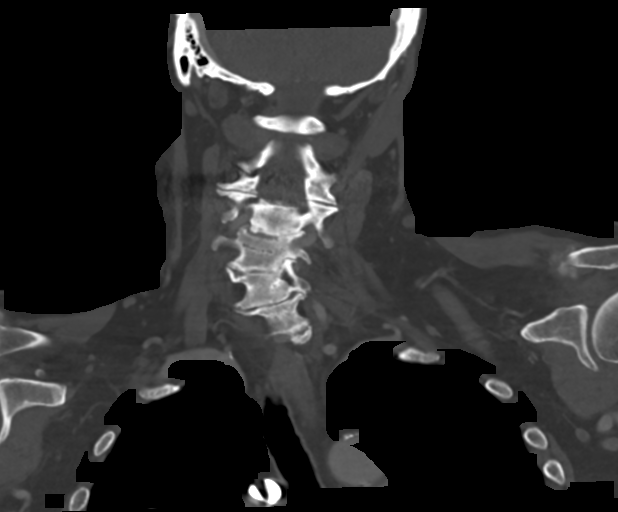

[Series 8: ax oropharynx · axial · 0.41mm/px · 1 of 129 slices shown]
[im 43/129  bone]
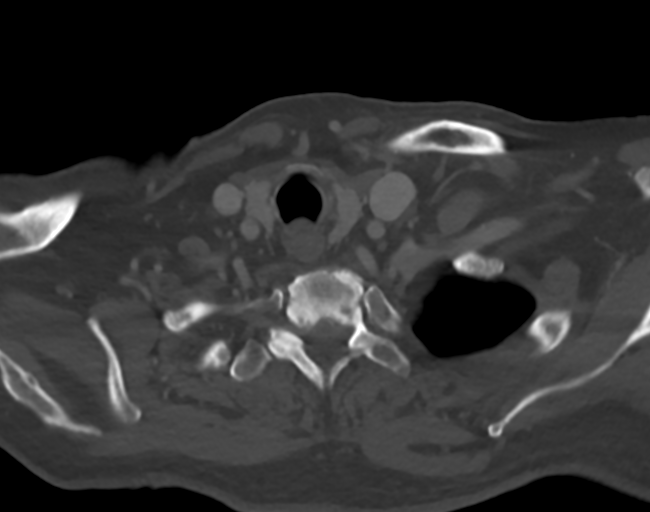

[11 of 35 positions shown; findings below may reference images not displayed]

Multidetector CT imaging of the cervical spine was performed without
intravenous contrast. Multiplanar CT image reconstructions were also
generated.

RADIATION DOSE REDUCTION: This exam was performed according to the
departmental dose-optimization program which includes automated
exposure control, adjustment of the mA and/or kV according to
patient size and/or use of iterative reconstruction technique.

CONTRAST:  100mL OMNIPAQUE IOHEXOL 300 MG/ML  SOLN
FINDINGS: CT NECK FINDINGS:

Pharynx and larynx: Streak and beam hardening artifact arising from
dental restoration obscures significant portions of the oral cavity
and oropharynx. Within this limitation, there is no appreciable
swelling or discrete mass within the oral cavity, pharynx or larynx.

Salivary glands: No inflammation, mass, or stone.

Thyroid: Unremarkable.

Lymph nodes: No pathologically enlarged lymph nodes identified
within the neck.

Vascular: The major vascular structures of the neck are patent.
Atherosclerotic plaque within the visualized aortic arch and
proximal major branch vessels of the neck, as well as carotid and
vertebral arteries.

Limited intracranial: No appreciable acute intracranial abnormality
within the field of view.

Visualized orbits: No orbital mass or acute orbital finding.

Mastoids and visualized paranasal sinuses: Small-volume secretions
within the left sphenoid sinus. No significant mastoid effusion.

Skeleton: Cervical spine findings reported below. Elsewhere, no
acute bony abnormality or aggressive osseous lesion is identified.

Upper chest: No consolidation within the imaged lung apices. Minimal
atelectasis within the dependent aspect of the imaged right upper
lobe.

CT CERVICAL SPINE FINDINGS:

Alignment: Prominent cervical dextrocurvature. Reversal of the
expected cervical lordosis. 3 mm C3-C4 grade 1 anterolisthesis. 2 mm
C7-T1 grade 1 anterolisthesis.

Skull base and vertebrae: The basion-dental and atlanto-dental
intervals are maintained.No evidence of acute fracture to the
cervical spine. At C2-C3, there is left-sided facet joint ankylosis
and suspected early osseous fusion across the left aspect of the
C2-C3 disc space. Multilevel ventral osteophytes, most prominent at
C4-C5, C5-C6 and C6-C7.

Soft tissues and spinal canal: No prevertebral fluid or swelling. No
visible canal hematoma.

Disc levels:

Multilevel disc space narrowing. Most notably, disc space narrowing
is advanced at C4-C5, C5-C6, C6-C7, C7-T1 and T1-T2. Endplate
irregularity and endplate sclerosis at T1-T2, which may be
degenerative.

Degenerative changes about the C1-C2 articulation.

C2-C3: Facet joint ankylosis on the left. Suspected early osseous
fusion across the left aspect of the disc space. Mild facet
arthrosis on the right. No appreciable significant disc herniation
or spinal canal stenosis. No significant bony neural foraminal
narrowing.

C3-C4: Grade 1 anterolisthesis. Posterior disc osteophyte complex
with bilateral disc osteophyte ridge/uncinate hypertrophy. Mild
facet arthrosis. No more than mild spinal canal narrowing is
appreciated. Severe bony neural foraminal narrowing on the right.
Mild bony neural foraminal narrowing on the left.

C4-C5: Posterior disc osteophyte complex eccentric to the left with
left-sided disc osteophyte ridge/uncinate hypertrophy. Minimal
uncovertebral hypertrophy also present on the right. Minimal facet
arthrosis. Moderate spinal canal stenosis. Severe left bony neural
foraminal narrowing.

C5-C6: Posterior disc osteophyte complex eccentric to the left with
left-sided disc osteophyte ridge/uncinate hypertrophy. Minimal
uncovertebral hypertrophy also present on the right. No more than
mild relative spinal canal narrowing or bony neural foraminal
narrowing is appreciated.

C6-C7: Posterior disc osteophyte complex with right greater than
left disc osteophyte ridge/uncinate hypertrophy. Moderate spinal
canal stenosis. Moderate bony neural foraminal narrowing on the
right.

C7-T1: Posterior disc osteophyte complex with right-sided disc
osteophyte ridge. Facet arthrosis on the left. Apparent mild spinal
canal stenosis. Bilateral bony neural foraminal narrowing
(moderate/severe right, moderate left).

T1-T2: Disc bulge with endplate osteophytes. Disc osteophyte ridge
to the right. No more than mild spinal canal narrowing is
appreciated. Moderate/severe right bony neural foraminal narrowing.
IMPRESSION: CT neck impression:

1. Streak and beam hardening artifact arising from dental
restoration obscures significant portions of the oral cavity and
oropharynx. Within this limitation, there is no appreciable mass,
swelling or inflammation within the neck or face.
2. Mild left sphenoid sinusitis.
3. Aortic Atherosclerosis ([id number]). Atherosclerotic plaque also
present within the proximal major branch vessels of the neck, as
well as carotid and vertebral arteries.

CT cervical spine impression:

1. No evidence of acute fracture to the cervical spine.
2. Cervical spondylosis, as outlined. Spinal canal stenosis is
greatest at C4-C5 and C6-C7 (moderate at these levels). Multilevel
foraminal stenosis, as detailed and greatest on the right at C3-C4
(severe), on the left at C4-C5 (severe), on the right at C7-T1
(moderate/severe) and on the right at T1-T2 (moderate/severe). Disc
degeneration is advanced at C4-C5, C5-C6, C6-C7 and C7-T1. Endplate
irregularity and sclerosis at T1-T[DATE] be degenerative in etiology.
Please note, a cervical spine MRI without and with contrast would
have greater sensitivity for early discitis/osteomyelitis.
3. Prominent cervical dextrocurvature.
4. Mild grade 1 anterolisthesis at C3-C4 and C7-T1.

## 2023-02-23 ENCOUNTER — Other Ambulatory Visit: Payer: Self-pay | Admitting: Neurology

## 2023-02-23 DIAGNOSIS — R519 Headache, unspecified: Secondary | ICD-10-CM

## 2023-03-05 ENCOUNTER — Ambulatory Visit
Admission: RE | Admit: 2023-03-05 | Discharge: 2023-03-05 | Disposition: A | Discharge status: Home / Self Care | Payer: Medicare Other | Source: Ambulatory Visit | Attending: Neurology | Admitting: Neurology

## 2023-03-05 DIAGNOSIS — R519 Headache, unspecified: Secondary | ICD-10-CM | POA: Diagnosis present

## 2023-10-29 ENCOUNTER — Other Ambulatory Visit: Payer: Self-pay | Admitting: Physical Medicine & Rehabilitation

## 2023-10-29 DIAGNOSIS — M5416 Radiculopathy, lumbar region: Secondary | ICD-10-CM

## 2023-11-03 ENCOUNTER — Ambulatory Visit
Admission: RE | Admit: 2023-11-03 | Discharge: 2023-11-03 | Discharge status: Home / Self Care | Source: Ambulatory Visit | Attending: Physical Medicine & Rehabilitation | Admitting: Physical Medicine & Rehabilitation

## 2023-11-03 DIAGNOSIS — M5416 Radiculopathy, lumbar region: Secondary | ICD-10-CM

## 2023-12-23 NOTE — Progress Notes (Signed)
 Referring Physician:  Dodson Delon FERNS, MD [address] [place],  [place] [address]  Primary Physician:  Auston Reyes BIRCH, MD  History of Present Illness: 12/27/2023 [name] is here today with a chief complaint of severe back pain.  Goes into his back and right buttocks.  Almost never goes down his leg.  He feels better when he is recumbent and worse while he standing up.  He does feel like his had progressive changes in his posture.  He does feel some giveaway in his right leg but does not have significant numbness or tingling.  He states that this is been getting progressively worse over the past 2 years but much worse over the past year.  Gets worse by walking or being upright.  He has had injections that are outlined below but has not had significant long-lasting relief.  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   I have utilized the care everywhere function in epic to review the outside records available from external health systems.  Review of Systems:  A 10 point review of systems is negative, except for the pertinent positives and negatives detailed in the HPI.  Past Medical History: Past Medical History:  Diagnosis Date   Arthritis     back   Congenital single kidney    only left kidney   Coronary artery disease    Diabetes mellitus without complication (HCC)    Diverticulitis    Diverticulitis    Dysrhythmia    PVC's   GERD (gastroesophageal reflux disease)    Hypertension    Mitral valve insufficiency    Stiff neck    Limited up and down motion due to arthritis    Past Surgical History: Past Surgical History:  Procedure Laterality Date   BACK SURGERY     crushed disc in 1979-lumbar area   CARDIAC CATHETERIZATION  2015   cardiac stents      cardiac stents    CATARACT EXTRACTION W/PHACO Right 04/29/2022   Procedure: CATARACT EXTRACTION PHACO AND INTRAOCULAR LENS PLACEMENT (IOC) RIGHT DIABETIC 10.86 01:31.1;  Surgeon: Mittie Gaskin, MD;  Location: [facility];  Service: Ophthalmology;  Laterality: Right;  diabetic   CATARACT EXTRACTION W/PHACO Left 05/13/2022   Procedure: CATARACT EXTRACTION PHACO AND INTRAOCULAR LENS PLACEMENT (IOC) LEFT DIABETIC;  Surgeon: Mittie Gaskin, MD;  Location: [facility];  Service: Ophthalmology;  Laterality: Left;  11.30 1:21.8   CHOLECYSTECTOMY     COLONOSCOPY WITH PROPOFOL  N/A 05/10/2015   Procedure: COLONOSCOPY WITH PROPOFOL ;  Surgeon: Deward CINDERELLA Piedmont, MD;  Location: [facility];  Service: Gastroenterology;  Laterality: N/A;   COLONOSCOPY WITH PROPOFOL  N/A 07/30/2020   Procedure: COLONOSCOPY WITH PROPOFOL ;  Surgeon: Maryruth Ole DASEN, MD;  Location: [facility] END[facility]COPY;  Service: Endoscopy;  Laterality: N/A;   COLONOSCOPY WITH PROPOFOL  N/A 03/30/2022  Procedure: COLONOSCOPY WITH PROPOFOL ;  Surgeon: Maryruth Ole DASEN, MD;  Location: [facility] END[facility];  Service: Endoscopy;  Laterality: N/A;    Allergies: Allergies as of 12/27/2023 - Review Complete 12/27/2023  Allergen Reaction Noted   Clonidine Swelling 04/23/2016   Dapagliflozin  05/07/2020   Glipizide Rash and Swelling 08/14/2013   Atorvastatin  02/15/2014   Crestor [rosuvastatin] Itching  02/26/2016   Doxycycline  Other (See Comments) 08/14/2013   Fenofibrate  07/05/2019   Isosorbide   01/28/2016   Meclizine  08/14/2013   Percocet [oxycodone -acetaminophen ] Nausea And Vomiting 12/26/2014   Ultram  [tramadol ] Itching 12/26/2014   Amlodipine Rash 08/22/2015   Atenolol Rash 12/26/2014   Clopidogrel Rash 12/21/2014   Lisinopril Rash 02/16/2020   Losartan Rash 04/23/2016   Metaxalone Rash 02/15/2014   Penicillins Rash 12/21/2014   Plavix [clopidogrel bisulfate] Itching and Rash 12/21/2014    Medications:  Current Outpatient Medications:    allopurinol  (ZYLOPRIM ) 100 MG tablet, Take 100 mg by mouth daily., Disp: , Rfl:    azelastine (ASTELIN) 0.1 % nasal spray, Place into both nostrils 2 (two) times daily as needed for rhinitis. Use in each nostril as directed, Disp: , Rfl:    colchicine  0.6 MG tablet, Take 0.6 mg by mouth daily as needed.  First signs of gout, take 2 tablets immediately, an hour later take additional 1 tablet, the next day continue 1 tablet daily for 5 days, Disp: , Rfl:    diazepam  (VALIUM ) 5 MG tablet, Take 5 mg by mouth at bedtime as needed for anxiety., Disp: , Rfl:    DULoxetine  (CYMBALTA ) 30 MG capsule, Take 30 mg by mouth at bedtime., Disp: , Rfl:    EPINEPHrine  0.3 mg/0.3 mL IJ SOAJ injection, Inject 0.3 mg into the muscle as needed for anaphylaxis., Disp: , Rfl:    ezetimibe  (ZETIA ) 10 MG tablet, Take 10 mg by mouth daily., Disp: , Rfl:    gabapentin  (NEURONTIN ) 600 MG tablet, Take 600 mg by mouth in the morning., Disp: , Rfl:    HYDROcodone -acetaminophen  (NORCO/VICODIN) 5-325 MG tablet, Take 1 tablet by mouth every 6 (six) hours as needed for moderate pain., Disp: , Rfl:    insulin  glargine (LANTUS ) 100 UNIT/ML injection, Inject 0.08 mLs (8 Units total) into the skin daily., Disp: 10 mL, Rfl:    isosorbide  mononitrate (IMDUR ) 30 MG 24 hr tablet, Take 30 mg by mouth 2 (two) times daily., Disp: , Rfl:    lovastatin (MEVACOR) 40 MG tablet, Take 40 mg by  mouth at bedtime., Disp: , Rfl:    metFORMIN (GLUCOPHAGE) 500 MG tablet, Take 500 mg by mouth 2 (two) times daily with a meal., Disp: , Rfl:    metoprolol  succinate (TOPROL -XL) 100 MG 24 hr tablet, Take 1 tablet by mouth 2 times daily at 12 noon and 4 pm., Disp: , Rfl:    nitroGLYCERIN  (NITROSTAT ) 0.4 MG SL tablet, Place 0.4 mg under the tongue every 5 (five) minutes as needed for chest pain., Disp: , Rfl:    Omeprazole-Sodium Bicarbonate (ZEGERID) 20-1100 MG CAPS capsule, Take 1 capsule by mouth daily before breakfast., Disp: , Rfl:    QUEtiapine  (SEROQUEL ) 25 MG tablet, Take 25 mg by mouth at bedtime., Disp: , Rfl:    Semaglutide (OZEMPIC, 0.25 OR 0.5 MG/DOSE, [place]), Inject 0.45 mg into the skin once a week. Friday, Disp: , Rfl:    telmisartan (MICARDIS) 80 MG tablet, Take 1 tablet by mouth every morning., Disp: , Rfl:   Social History: Social History   Tobacco  Use   Smoking status: Never   Smokeless tobacco: Never  Vaping Use   Vaping status: Never Used  Substance Use Topics   Alcohol use: No   Drug use: No    Family Medical History: No family history on file.  Physical Examination: Vitals:   12/27/23 1120  BP: 138/84    General: Patient is in no apparent distress. Attention to examination is appropriate.  Neck:   Supple.  Full range of motion.  Respiratory: Patient is breathing without any difficulty.   NEUROLOGICAL:     Awake, alert, oriented to person, place, and time.  Speech is clear and fluent.   Cranial Nerves: Pupils equal round and reactive to light.  Facial tone is symmetric.  Facial sensation is symmetric. Shoulder shrug is symmetric. Tongue protrusion is midline.    Strength:  Side Iliopsoas Quads Hamstring PF DF EHL  R 5 5 5 5 5 5   L 5 5 5 5 5 5    Reflexes are 1+ and symmetric at the biceps, triceps, brachioradialis.   Hoffman's is absent. Clonus is absent  He has a severe forward carriage  Imaging: Narrative & Impression CLINICAL DATA:  Low back  pain radiating to right hip and down right leg   EXAM: MRI LUMBAR SPINE WITHOUT CONTRAST   TECHNIQUE: Multiplanar, multisequence MR imaging of the lumbar spine was performed. No intravenous contrast was administered.   COMPARISON:  CT of the lumbar spine dated 10/08/2022   FINDINGS: Segmentation: Standard.   Alignment:  Dextrocurvature of the lumbar spine with apex at L3.   Vertebrae: Degenerative endplate marrow changes at a few levels. No compression fractures.   Conus medullaris and cauda equina: The conus medullaris terminates at the level of L1-L2. The distal spinal cord signal intensity is normal.   Paraspinal and other soft tissues: Absent left kidney. Right renal cyst. The visualized aorta is normal.   Disc levels:   L1-L2: Disc is normal in configuration. Mild bilateral facet arthropathy. No neuroforaminal stenosis. No spinal canal stenosis.   L2-L3: Disc bulge. Mild bilateral facet arthropathy. Mild bilateral neuroforaminal stenosis. Mild spinal canal stenosis.   L3-L4: Disc bulge. Moderate bilateral facet arthropathy. Severe bilateral neuroforaminal stenosis. Severe spinal canal stenosis.   L4-L5: Disc bulge. Moderate bilateral facet arthropathy. Severe right neuroforaminal stenosis. No spinal canal stenosis.   L5-S1: Disc bulge. Moderate bilateral facet arthropathy. Moderate right neuroforaminal stenosis. Mild spinal canal stenosis.   IMPRESSION: 1. 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 2. Severe right neural foraminal stenosis at L4-L5. 3. Moderate right neural foraminal stenosis at L5-S1.     Electronically Signed   By: Clem Savory M.D.   On: 12/01/2023 11:01 I have personally reviewed the images and agree with the above interpretation.  Medical Decision Making/Assessment and Plan: Mr. Elpers is a pleasant 83 y.o. male with what sounds like mechanical back pain.  He has had pain has been worsening  over the past 2 years but has accelerated over the past calendar year.  He feels most of his pain in his midline low back like a band across to his spine but goes into his right buttocks as well.  He does not feel extending down into his leg.  Symptoms do not sound radicular.  He does feel that he improves with recumbency or sitting down.  His pain is exacerbated by walking or being upright.  His physical examination shows good strength with no localizing neurologic deficits.  His  posture does show a significant coronal and sagittal deformity.  He has an antalgic gait.  I will plan to get scoliosis x-rays and flexion-extension x-rays of his low back.  We did discuss that a back pain operation would be a major intervention in his case as he would likely need multiple levels of correction.  I will plan on reviewing his case with Dr. Clois and we will get back to the patient once his x-rays have been completed and read.  Thank you for involving me in the care of this patient.    Penne MICAEL Sharps MD/MSCR Neurosurgery

## 2023-12-27 ENCOUNTER — Encounter: Payer: Self-pay | Admitting: Neurosurgery

## 2023-12-27 ENCOUNTER — Ambulatory Visit: Admitting: Neurosurgery

## 2023-12-27 VITALS — BP 138/84 | Ht 71.0 in | Wt 165.5 lb

## 2023-12-27 DIAGNOSIS — M48062 Spinal stenosis, lumbar region with neurogenic claudication: Secondary | ICD-10-CM

## 2023-12-27 DIAGNOSIS — M961 Postlaminectomy syndrome, not elsewhere classified: Secondary | ICD-10-CM

## 2023-12-27 DIAGNOSIS — M545 Low back pain, unspecified: Secondary | ICD-10-CM

## 2023-12-27 DIAGNOSIS — R2689 Other abnormalities of gait and mobility: Secondary | ICD-10-CM | POA: Diagnosis not present

## 2023-12-27 DIAGNOSIS — M439 Deforming dorsopathy, unspecified: Secondary | ICD-10-CM | POA: Diagnosis not present

## 2023-12-27 DIAGNOSIS — M47816 Spondylosis without myelopathy or radiculopathy, lumbar region: Secondary | ICD-10-CM

## 2024-01-03 ENCOUNTER — Ambulatory Visit
Admission: RE | Admit: 2024-01-03 | Discharge: 2024-01-03 | Disposition: A | Discharge status: Home / Self Care | Source: Ambulatory Visit | Attending: Neurosurgery | Admitting: Neurosurgery

## 2024-01-03 DIAGNOSIS — M47816 Spondylosis without myelopathy or radiculopathy, lumbar region: Secondary | ICD-10-CM | POA: Diagnosis present

## 2024-01-03 DIAGNOSIS — M48062 Spinal stenosis, lumbar region with neurogenic claudication: Secondary | ICD-10-CM | POA: Diagnosis present

## 2024-01-03 DIAGNOSIS — M961 Postlaminectomy syndrome, not elsewhere classified: Secondary | ICD-10-CM

## 2024-01-31 ENCOUNTER — Ambulatory Visit: Admitting: Neurosurgery

## 2024-01-31 DIAGNOSIS — S065XAA Traumatic subdural hemorrhage with loss of consciousness status unknown, initial encounter: Secondary | ICD-10-CM

## 2024-01-31 NOTE — Progress Notes (Signed)
 Unable to reach patient.

## 2024-02-23 ENCOUNTER — Ambulatory Visit (INDEPENDENT_AMBULATORY_CARE_PROVIDER_SITE_OTHER): Admitting: Neurosurgery

## 2024-02-23 DIAGNOSIS — M961 Postlaminectomy syndrome, not elsewhere classified: Secondary | ICD-10-CM

## 2024-02-23 DIAGNOSIS — Q7649 Other congenital malformations of spine, not associated with scoliosis: Secondary | ICD-10-CM | POA: Diagnosis not present

## 2024-02-27 NOTE — Progress Notes (Signed)
 I was able to have a follow-up phone visit with Edwin Olson.  He was at home and I was in the office.  He gave consent to go forward with a phone visit.  We discussed his significant deformity.  I let him know that this type of deformity if deemed treatable would require a major surgical intervention and would likely need to be done at a tertiary care center.  I did let him know that he may have an evaluation with the spine surgeon and they may feel that it is too high risk and not offer him surgery as it may not be in his best interest, he acknowledges understanding and would still like to go forward with a consultation as this is caused a significant impact on his quality of life.  Plan to make a referral to the [facility] at patient's request.  Penne MICAEL Sharps, MD  Spent a total of 10 minutes on this phone call.

## 2024-08-07 ENCOUNTER — Inpatient Hospital Stay: Admission: RE | Admit: 2024-08-07

## 2024-08-16 ENCOUNTER — Encounter: Admission: RE | Payer: Self-pay

## 2024-08-16 ENCOUNTER — Ambulatory Visit: Admission: RE | Admit: 2024-08-16 | Admitting: Orthopedic Surgery

## 2024-08-16 DIAGNOSIS — Z880 Allergy status to penicillin: Secondary | ICD-10-CM

## 2024-08-16 DIAGNOSIS — S065XAA Traumatic subdural hemorrhage with loss of consciousness status unknown, initial encounter: Secondary | ICD-10-CM

## 2024-08-16 DIAGNOSIS — M961 Postlaminectomy syndrome, not elsewhere classified: Secondary | ICD-10-CM

## 2024-08-16 DIAGNOSIS — I2510 Atherosclerotic heart disease of native coronary artery without angina pectoris: Secondary | ICD-10-CM

## 2024-08-16 DIAGNOSIS — M1611 Unilateral primary osteoarthritis, right hip: Secondary | ICD-10-CM

## 2024-08-16 DIAGNOSIS — S060XAD Concussion with loss of consciousness status unknown, subsequent encounter: Secondary | ICD-10-CM

## 2024-08-16 DIAGNOSIS — E1142 Type 2 diabetes mellitus with diabetic polyneuropathy: Secondary | ICD-10-CM

## 2024-08-16 DIAGNOSIS — I200 Unstable angina: Secondary | ICD-10-CM

## 2024-08-16 DIAGNOSIS — I5022 Chronic systolic (congestive) heart failure: Secondary | ICD-10-CM
